# Patient Record
Sex: Male | Born: 1950
Health system: Southern US, Community
[De-identification: ages and names within clinical notes are randomized; demographics above are authoritative.]

## PROBLEM LIST (undated history)

## (undated) DIAGNOSIS — I10 Essential (primary) hypertension: Secondary | ICD-10-CM

## (undated) DIAGNOSIS — M797 Fibromyalgia: Secondary | ICD-10-CM

## (undated) DIAGNOSIS — M353 Polymyalgia rheumatica: Secondary | ICD-10-CM

## (undated) DIAGNOSIS — J301 Allergic rhinitis due to pollen: Secondary | ICD-10-CM

## (undated) DIAGNOSIS — N200 Calculus of kidney: Secondary | ICD-10-CM

## (undated) DIAGNOSIS — C4491 Basal cell carcinoma of skin, unspecified: Secondary | ICD-10-CM

## (undated) DIAGNOSIS — I4891 Unspecified atrial fibrillation: Secondary | ICD-10-CM

## (undated) DIAGNOSIS — Z87442 Personal history of urinary calculi: Secondary | ICD-10-CM

## (undated) DIAGNOSIS — N400 Enlarged prostate without lower urinary tract symptoms: Secondary | ICD-10-CM

## (undated) DIAGNOSIS — C801 Malignant (primary) neoplasm, unspecified: Secondary | ICD-10-CM

## (undated) DIAGNOSIS — E785 Hyperlipidemia, unspecified: Secondary | ICD-10-CM

## (undated) DIAGNOSIS — M1990 Unspecified osteoarthritis, unspecified site: Secondary | ICD-10-CM

## (undated) DIAGNOSIS — D099 Carcinoma in situ, unspecified: Secondary | ICD-10-CM

## (undated) HISTORY — DX: Essential (primary) hypertension: I10

## (undated) HISTORY — DX: Allergic rhinitis due to pollen: J30.1

## (undated) HISTORY — DX: Benign prostatic hyperplasia without lower urinary tract symptoms: N40.0

## (undated) HISTORY — DX: Unspecified osteoarthritis, unspecified site: M19.90

## (undated) HISTORY — DX: Basal cell carcinoma of skin, unspecified: C44.91

## (undated) HISTORY — DX: Carcinoma in situ, unspecified: D09.9

## (undated) HISTORY — DX: Polymyalgia rheumatica: M35.3

## (undated) HISTORY — PX: COLONOSCOPY: SHX174

## (undated) HISTORY — PX: CATARACT EXTRACTION: SUR2

## (undated) HISTORY — DX: Unspecified atrial fibrillation: I48.91

## (undated) HISTORY — DX: Calculus of kidney: N20.0

## (undated) HISTORY — PX: RETINAL TEAR REPAIR CRYOTHERAPY: SHX5304

## (undated) HISTORY — DX: Hyperlipidemia, unspecified: E78.5

## (undated) HISTORY — DX: Malignant (primary) neoplasm, unspecified: C80.1

---

## 1997-04-05 HISTORY — PX: THUMB ARTHROSCOPY: SHX2509

## 2006-01-11 ENCOUNTER — Ambulatory Visit: Payer: Self-pay | Admitting: Internal Medicine

## 2006-01-27 ENCOUNTER — Ambulatory Visit: Payer: Self-pay | Admitting: Internal Medicine

## 2006-01-28 ENCOUNTER — Ambulatory Visit: Payer: Self-pay | Admitting: Internal Medicine

## 2006-02-23 ENCOUNTER — Ambulatory Visit: Payer: Self-pay | Admitting: Internal Medicine

## 2006-03-23 ENCOUNTER — Ambulatory Visit: Payer: Self-pay | Admitting: Internal Medicine

## 2006-03-30 ENCOUNTER — Ambulatory Visit: Payer: Self-pay | Admitting: Internal Medicine

## 2006-04-27 ENCOUNTER — Ambulatory Visit: Payer: Self-pay | Admitting: Internal Medicine

## 2006-05-25 ENCOUNTER — Ambulatory Visit: Payer: Self-pay | Admitting: Internal Medicine

## 2006-06-22 ENCOUNTER — Ambulatory Visit: Payer: Self-pay | Admitting: Internal Medicine

## 2006-07-11 ENCOUNTER — Ambulatory Visit: Payer: Self-pay | Admitting: Internal Medicine

## 2006-07-28 ENCOUNTER — Encounter: Payer: Self-pay | Admitting: Internal Medicine

## 2006-08-01 ENCOUNTER — Ambulatory Visit: Payer: Self-pay | Admitting: Internal Medicine

## 2006-08-01 DIAGNOSIS — M7660 Achilles tendinitis, unspecified leg: Secondary | ICD-10-CM

## 2006-08-03 ENCOUNTER — Ambulatory Visit: Payer: Self-pay | Admitting: Internal Medicine

## 2006-08-03 LAB — CONVERTED CEMR LAB: INR: 2.8

## 2006-09-13 ENCOUNTER — Ambulatory Visit: Payer: Self-pay | Admitting: Internal Medicine

## 2006-09-13 DIAGNOSIS — M1990 Unspecified osteoarthritis, unspecified site: Secondary | ICD-10-CM | POA: Insufficient documentation

## 2006-09-14 ENCOUNTER — Ambulatory Visit: Payer: Self-pay | Admitting: Internal Medicine

## 2006-09-15 LAB — CONVERTED CEMR LAB: PSA: 0.98 ng/mL (ref 0.10–4.00)

## 2006-10-19 ENCOUNTER — Ambulatory Visit: Payer: Self-pay | Admitting: Internal Medicine

## 2006-11-02 ENCOUNTER — Ambulatory Visit: Payer: Self-pay | Admitting: Internal Medicine

## 2006-11-10 ENCOUNTER — Ambulatory Visit: Payer: Self-pay | Admitting: Internal Medicine

## 2006-11-10 LAB — CONVERTED CEMR LAB: Prothrombin Time: 21.8 s

## 2006-12-08 ENCOUNTER — Ambulatory Visit: Payer: Self-pay | Admitting: Internal Medicine

## 2007-01-05 ENCOUNTER — Ambulatory Visit: Payer: Self-pay | Admitting: Internal Medicine

## 2007-01-05 LAB — CONVERTED CEMR LAB: Prothrombin Time: 21.6 s

## 2007-02-09 ENCOUNTER — Ambulatory Visit: Payer: Self-pay | Admitting: Internal Medicine

## 2007-02-09 LAB — CONVERTED CEMR LAB: Prothrombin Time: 23.6 s

## 2007-03-16 ENCOUNTER — Ambulatory Visit: Payer: Self-pay | Admitting: Internal Medicine

## 2007-03-16 ENCOUNTER — Telehealth (INDEPENDENT_AMBULATORY_CARE_PROVIDER_SITE_OTHER): Payer: Self-pay | Admitting: *Deleted

## 2007-03-16 LAB — CONVERTED CEMR LAB
INR: 3
Prothrombin Time: 21 s

## 2007-04-13 ENCOUNTER — Ambulatory Visit: Payer: Self-pay | Admitting: Internal Medicine

## 2007-04-13 LAB — CONVERTED CEMR LAB
INR: 2.6
Prothrombin Time: 19.7 s

## 2007-05-25 ENCOUNTER — Ambulatory Visit: Payer: Self-pay | Admitting: Internal Medicine

## 2007-05-25 LAB — CONVERTED CEMR LAB: Prothrombin Time: 21 s

## 2007-07-06 ENCOUNTER — Ambulatory Visit: Payer: Self-pay | Admitting: Internal Medicine

## 2007-07-07 LAB — CONVERTED CEMR LAB: Prothrombin Time: 26.3 s — ABNORMAL HIGH (ref 10.9–13.3)

## 2007-07-18 ENCOUNTER — Ambulatory Visit: Payer: Self-pay | Admitting: Internal Medicine

## 2007-07-18 LAB — CONVERTED CEMR LAB: Prothrombin Time: 17.3 s

## 2007-08-04 HISTORY — PX: CAPSULOTOMY: SHX379

## 2007-08-07 ENCOUNTER — Ambulatory Visit: Payer: Self-pay | Admitting: Internal Medicine

## 2007-08-07 LAB — CONVERTED CEMR LAB: INR: 3.7

## 2007-08-21 ENCOUNTER — Ambulatory Visit: Payer: Self-pay | Admitting: Internal Medicine

## 2007-08-21 LAB — CONVERTED CEMR LAB: INR: 3

## 2007-09-22 ENCOUNTER — Ambulatory Visit: Payer: Self-pay | Admitting: Internal Medicine

## 2007-09-22 LAB — CONVERTED CEMR LAB: INR: 4

## 2007-09-25 ENCOUNTER — Encounter: Payer: Self-pay | Admitting: Internal Medicine

## 2007-09-25 LAB — CONVERTED CEMR LAB
Sex Hormone Binding: 34 nmol/L (ref 13–71)
Testosterone: 476.01 ng/dL (ref 350–890)

## 2007-09-26 LAB — CONVERTED CEMR LAB
AST: 21 units/L (ref 0–37)
Albumin: 4.2 g/dL (ref 3.5–5.2)
BUN: 19 mg/dL (ref 6–23)
CO2: 30 meq/L (ref 19–32)
Calcium: 9.3 mg/dL (ref 8.4–10.5)
Chloride: 105 meq/L (ref 96–112)
Creatinine, Ser: 1 mg/dL (ref 0.4–1.5)
Eosinophils Absolute: 0.1 10*3/uL (ref 0.0–0.7)
Glucose, Bld: 96 mg/dL (ref 70–99)
Hemoglobin: 14.7 g/dL (ref 13.0–17.0)
Lymphocytes Relative: 31.4 % (ref 12.0–46.0)
MCHC: 33.4 g/dL (ref 30.0–36.0)
Neutrophils Relative %: 57.6 % (ref 43.0–77.0)
PSA: 0.99 ng/mL (ref 0.10–4.00)
Phosphorus: 3.5 mg/dL (ref 2.3–4.6)
Prolactin: 3.4 ng/mL
Sodium: 140 meq/L (ref 135–145)
Triglycerides: 96 mg/dL (ref 0–149)
VLDL: 19 mg/dL (ref 0–40)

## 2007-10-09 ENCOUNTER — Ambulatory Visit: Payer: Self-pay | Admitting: Internal Medicine

## 2007-10-10 LAB — CONVERTED CEMR LAB: Prothrombin Time: 28.4 s — ABNORMAL HIGH (ref 10.9–13.3)

## 2007-10-13 ENCOUNTER — Encounter: Payer: Self-pay | Admitting: Internal Medicine

## 2007-11-07 ENCOUNTER — Ambulatory Visit: Payer: Self-pay | Admitting: Internal Medicine

## 2007-12-05 ENCOUNTER — Ambulatory Visit: Payer: Self-pay | Admitting: Internal Medicine

## 2007-12-05 LAB — CONVERTED CEMR LAB
INR: 2.8
Prothrombin Time: 20.2 s

## 2007-12-22 ENCOUNTER — Ambulatory Visit: Payer: Self-pay | Admitting: Internal Medicine

## 2008-01-03 ENCOUNTER — Telehealth: Payer: Self-pay | Admitting: Internal Medicine

## 2008-01-04 ENCOUNTER — Ambulatory Visit: Payer: Self-pay | Admitting: Internal Medicine

## 2008-01-04 LAB — CONVERTED CEMR LAB: INR: 3.1

## 2008-02-12 ENCOUNTER — Ambulatory Visit: Payer: Self-pay | Admitting: Internal Medicine

## 2008-02-12 LAB — CONVERTED CEMR LAB: INR: 3.4

## 2008-03-04 ENCOUNTER — Ambulatory Visit: Payer: Self-pay | Admitting: Internal Medicine

## 2008-03-04 LAB — CONVERTED CEMR LAB: Prothrombin Time: 20 s

## 2008-04-08 ENCOUNTER — Ambulatory Visit: Payer: Self-pay | Admitting: Internal Medicine

## 2008-05-06 ENCOUNTER — Ambulatory Visit: Payer: Self-pay | Admitting: Internal Medicine

## 2008-05-07 ENCOUNTER — Ambulatory Visit: Payer: Self-pay | Admitting: Internal Medicine

## 2008-05-20 ENCOUNTER — Ambulatory Visit: Payer: Self-pay | Admitting: Internal Medicine

## 2008-05-20 LAB — CONVERTED CEMR LAB: INR: 2.6

## 2008-06-17 ENCOUNTER — Ambulatory Visit: Payer: Self-pay | Admitting: Internal Medicine

## 2008-06-17 LAB — CONVERTED CEMR LAB: INR: 3.2

## 2008-07-04 HISTORY — PX: SQUAMOUS CELL CARCINOMA EXCISION: SHX2433

## 2008-07-12 ENCOUNTER — Ambulatory Visit: Payer: Self-pay | Admitting: Internal Medicine

## 2008-07-12 LAB — CONVERTED CEMR LAB: Prothrombin Time: 26.7 s

## 2008-07-19 ENCOUNTER — Ambulatory Visit: Payer: Self-pay | Admitting: Internal Medicine

## 2008-07-19 LAB — CONVERTED CEMR LAB
INR: 1.6
Prothrombin Time: 15.4 s

## 2008-08-02 ENCOUNTER — Ambulatory Visit: Payer: Self-pay | Admitting: Internal Medicine

## 2008-08-02 LAB — CONVERTED CEMR LAB: Prothrombin Time: 24.9 s

## 2008-08-05 ENCOUNTER — Ambulatory Visit: Payer: Self-pay | Admitting: Internal Medicine

## 2008-08-16 ENCOUNTER — Ambulatory Visit: Payer: Self-pay | Admitting: Internal Medicine

## 2008-08-16 LAB — CONVERTED CEMR LAB: INR: 2.8

## 2008-09-06 ENCOUNTER — Ambulatory Visit: Payer: Self-pay | Admitting: Internal Medicine

## 2008-09-06 LAB — CONVERTED CEMR LAB
INR: 2.3
Prothrombin Time: 18.5 s

## 2008-10-04 ENCOUNTER — Ambulatory Visit: Payer: Self-pay | Admitting: Internal Medicine

## 2008-10-04 LAB — CONVERTED CEMR LAB
INR: 2.8
Prothrombin Time: 20.4 s

## 2008-11-12 ENCOUNTER — Ambulatory Visit: Payer: Self-pay | Admitting: Internal Medicine

## 2008-11-12 LAB — CONVERTED CEMR LAB: INR: 2.5

## 2008-12-18 ENCOUNTER — Ambulatory Visit: Payer: Self-pay | Admitting: Internal Medicine

## 2008-12-18 LAB — CONVERTED CEMR LAB: Prothrombin Time: 21.6 s

## 2008-12-19 ENCOUNTER — Ambulatory Visit: Payer: Self-pay | Admitting: Family Medicine

## 2009-01-09 ENCOUNTER — Ambulatory Visit: Payer: Self-pay | Admitting: Internal Medicine

## 2009-01-21 ENCOUNTER — Ambulatory Visit: Payer: Self-pay | Admitting: Internal Medicine

## 2009-01-21 LAB — CONVERTED CEMR LAB: INR: 2.3

## 2009-02-18 ENCOUNTER — Ambulatory Visit: Payer: Self-pay | Admitting: Internal Medicine

## 2009-02-18 LAB — CONVERTED CEMR LAB: INR: 2.7

## 2009-04-01 ENCOUNTER — Ambulatory Visit: Payer: Self-pay | Admitting: Internal Medicine

## 2009-04-01 LAB — CONVERTED CEMR LAB: Prothrombin Time: 20.2 s

## 2009-04-28 ENCOUNTER — Telehealth: Payer: Self-pay | Admitting: Internal Medicine

## 2009-04-29 ENCOUNTER — Ambulatory Visit: Payer: Self-pay | Admitting: Family Medicine

## 2009-04-30 ENCOUNTER — Telehealth: Payer: Self-pay | Admitting: Family Medicine

## 2009-05-01 ENCOUNTER — Encounter: Admission: RE | Admit: 2009-05-01 | Discharge: 2009-05-01 | Payer: Self-pay | Admitting: Family Medicine

## 2009-05-07 ENCOUNTER — Ambulatory Visit: Payer: Self-pay | Admitting: Internal Medicine

## 2009-05-07 LAB — CONVERTED CEMR LAB
Alkaline Phosphatase: 49 units/L (ref 39–117)
Basophils Absolute: 0 10*3/uL (ref 0.0–0.1)
Bilirubin, Direct: 0.1 mg/dL (ref 0.0–0.3)
Eosinophils Absolute: 0.1 10*3/uL (ref 0.0–0.7)
Eosinophils Relative: 2.5 % (ref 0.0–5.0)
Lymphocytes Relative: 30.3 % (ref 12.0–46.0)
Lymphs Abs: 1.5 10*3/uL (ref 0.7–4.0)
MCV: 95.6 fL (ref 78.0–100.0)
Monocytes Absolute: 0.4 10*3/uL (ref 0.1–1.0)
Monocytes Relative: 8.4 % (ref 3.0–12.0)
Phosphorus: 3.4 mg/dL (ref 2.3–4.6)
Platelets: 203 10*3/uL (ref 150.0–400.0)
RDW: 12.5 % (ref 11.5–14.6)
Total CHOL/HDL Ratio: 5
Total Protein: 6.4 g/dL (ref 6.0–8.3)

## 2009-05-12 ENCOUNTER — Ambulatory Visit: Payer: Self-pay | Admitting: Internal Medicine

## 2009-05-12 DIAGNOSIS — E785 Hyperlipidemia, unspecified: Secondary | ICD-10-CM

## 2009-05-12 DIAGNOSIS — I4891 Unspecified atrial fibrillation: Secondary | ICD-10-CM | POA: Insufficient documentation

## 2009-05-12 LAB — CONVERTED CEMR LAB: Prothrombin Time: 18.1 s

## 2009-06-24 ENCOUNTER — Ambulatory Visit: Payer: Self-pay | Admitting: Internal Medicine

## 2009-06-24 LAB — CONVERTED CEMR LAB: INR: 2.6

## 2009-06-30 ENCOUNTER — Telehealth: Payer: Self-pay | Admitting: Internal Medicine

## 2009-08-05 ENCOUNTER — Ambulatory Visit: Payer: Self-pay | Admitting: Internal Medicine

## 2009-08-05 LAB — CONVERTED CEMR LAB
INR: 2.4
Prothrombin Time: 29.1 s

## 2009-08-11 ENCOUNTER — Ambulatory Visit: Payer: Self-pay | Admitting: Internal Medicine

## 2009-08-11 DIAGNOSIS — R0989 Other specified symptoms and signs involving the circulatory and respiratory systems: Secondary | ICD-10-CM

## 2009-08-11 DIAGNOSIS — R0609 Other forms of dyspnea: Secondary | ICD-10-CM

## 2009-08-12 ENCOUNTER — Encounter: Payer: Self-pay | Admitting: Internal Medicine

## 2009-08-19 ENCOUNTER — Telehealth: Payer: Self-pay | Admitting: Internal Medicine

## 2009-09-03 HISTORY — PX: MENISCUS REPAIR: SHX5179

## 2009-09-05 ENCOUNTER — Encounter: Payer: Self-pay | Admitting: Internal Medicine

## 2009-09-05 ENCOUNTER — Ambulatory Visit (HOSPITAL_COMMUNITY): Admission: RE | Admit: 2009-09-05 | Discharge: 2009-09-05 | Payer: Self-pay | Admitting: Internal Medicine

## 2009-09-05 ENCOUNTER — Ambulatory Visit: Payer: Self-pay

## 2009-09-05 ENCOUNTER — Ambulatory Visit: Payer: Self-pay | Admitting: Cardiovascular Disease

## 2009-09-08 ENCOUNTER — Telehealth: Payer: Self-pay | Admitting: Internal Medicine

## 2009-09-16 ENCOUNTER — Ambulatory Visit: Payer: Self-pay | Admitting: Internal Medicine

## 2009-09-16 LAB — CONVERTED CEMR LAB: Prothrombin Time: 20.5 s

## 2009-09-30 ENCOUNTER — Ambulatory Visit: Payer: Self-pay | Admitting: Internal Medicine

## 2009-09-30 LAB — CONVERTED CEMR LAB
INR: 2.2
Prothrombin Time: 26.4 s

## 2009-11-11 ENCOUNTER — Ambulatory Visit: Payer: Self-pay | Admitting: Internal Medicine

## 2009-12-23 ENCOUNTER — Ambulatory Visit: Payer: Self-pay | Admitting: Internal Medicine

## 2009-12-23 LAB — CONVERTED CEMR LAB
INR: 2.2
Prothrombin Time: 26.5 s

## 2009-12-24 ENCOUNTER — Ambulatory Visit: Payer: Self-pay | Admitting: Internal Medicine

## 2010-02-03 ENCOUNTER — Ambulatory Visit: Payer: Self-pay | Admitting: Internal Medicine

## 2010-02-03 LAB — CONVERTED CEMR LAB: INR: 2.1

## 2010-03-17 ENCOUNTER — Ambulatory Visit: Payer: Self-pay | Admitting: Internal Medicine

## 2010-04-02 ENCOUNTER — Ambulatory Visit
Admission: RE | Admit: 2010-04-02 | Discharge: 2010-04-02 | Payer: Self-pay | Source: Home / Self Care | Attending: Internal Medicine | Admitting: Internal Medicine

## 2010-04-02 LAB — CONVERTED CEMR LAB
INR: 2.3
Prothrombin Time: 28.7 s

## 2010-05-05 NOTE — Progress Notes (Signed)
Summary: sorethroat, cough, wheezing at night  Phone Note Call from Patient Call back at [phone]   Caller: Patient Call For: Cindee Salt MD Summary of Call: Pt has sorethroat, drainage at back of throat, non productive cough, wheezing on the exhale during the night last night. No trouble getting his breath. No known fever, No pain in chest when coughs or takes deep breath. Symptoms began last night. Pt uses [phone] if needed. Please advise.  Initial call taken by: Lewanda Rife LPN,  April 28, 2009 2:01 PM  Follow-up for Phone Call        sounds like a cold I recommend tylenol and he can try honey for cough. vick's vapor rub has shown some effectiveness with congestion If worsens, or doesn't improve over time, can set up appt for eval Follow-up by: Cindee Salt MD,  April 28, 2009 2:25 PM  Additional Follow-up for Phone Call Additional follow up Details #1::        Spoke with patient's wife and advised results.  Additional Follow-up by: Mervin Hack CMA Duncan Dull),  April 28, 2009 3:19 PM

## 2010-05-05 NOTE — Assessment & Plan Note (Signed)
Summary: 1 YR F/U   Visit Type:  1 yr. Primary Provider:  Dr Alphonsus Sias  CC:  no complaints.  History of Present Illness: Rodney Gray returns today for followup.  He is a middle aged man with chronic atrial fibrillation who has been maintained on coumadin for the past several years.  He has no symptoms of his atrial fibrillation.  He denies c/p.  He has had 3 episodes of dyspnea lasting up to an hour which have occured at night. No peripheral edema.  Problems Prior to Update: 1)  Atrial Fibrillation  (ICD-427.31) 2)  Hyperlipidemia  (ICD-272.4) 3)  Hypertension  (ICD-401.9) 4)  Need Prophylactic Vaccination&inoculation Flu  (ICD-V04.81) 5)  Erectile Dysfunction, Organic  (ICD-607.84) 6)  Screening For Malignant Neoplasm, Prostate  (ICD-V76.44) 7)  Health Maintenance Exam  (ICD-V70.0) 8)  Osteoarthritis  (ICD-715.90) 9)  Anticoagulation Therapy  (ICD-V58.61) 10)  Encounter For Therapeutic Drug Monitoring  (ICD-V58.83) 11)  Achilles Tendinitis  (ICD-726.71)  Current Medications (verified): 1)  Coumadin 5 Mg Tabs (Warfarin Sodium) .... Take As Directed 2)  Metoprolol Succinate 25 Mg Tb24 (Metoprolol Succinate) .... 1/2tablet Twice A Day By Mouth 3)  Fish Oil 500 Mg Caps (Omega-3 Fatty Acids) .... He Uses Otc 4)  Therapeutic Multivitamin  Tabs (Multiple Vitamin) .Marland Kitchen.. 1 Daily By Mouth 5)  Cinnamon 500 Mg Caps (Cinnamon) .... Patient Takes 1000mg  Two Times A Day  ** We Don't Have A 1000mg  Listed 6)  Glucosamine-Chondroitin 500-400 Mg Caps (Glucosamine-Chondroitin) .... Take 1 Capsule By Mouth Once A Day 7)  Cholest Off 450 Mg Tabs (Plant Sterols and Stanols) .... Take 2  Tablet By Mouth Once A Day  Allergies: 1)  ! Ampicillin (Ampicillin) 2)  Quinidine  Past History:  Past Medical History: Last updated: 05/12/2009 Atrial fibrillation Osteoarthritis HTN Hyperlipidemia  Past Surgical History: Last updated: 05/12/2009 A-Fib, 1987, 2003, 2005, EPS RX 2005 (A-Fib reocurred) Left  thumb surgery 1999 Retinal tear/bleed- Right 12/06 Cataracts/IOL bilat----3/08 5/09 Capsulotomy bilaterally Squamous cell carcinoma left forearm  4/10  Review of Systems  The patient denies chest pain, syncope, dyspnea on exertion, and peripheral edema.    Vital Signs:  Patient profile:   60 year old male Height:      68 inches Weight:      183 pounds BMI:     27.93 Pulse rate:   80 / minute BP sitting:   116 / 68  (left arm) Cuff size:   regular  Vitals Entered By: Oswald Hillock (Aug 11, 2009 11:34 AM)  Physical Exam  General:  Well developed, well nourished, in no acute distress. Head:  normocephalic and atraumatic Eyes:  PERRLA/EOM intact; conjunctiva and lids normal. Mouth:  Oral mucosa and oropharynx without lesions or exudates.  Teeth in good repair. Neck:  Neck supple, no JVD. No masses, thyromegaly or abnormal cervical nodes. Lungs:  normal respiratory effort, no intercostal retractions, and no accessory muscle use.    Heart:  IRIR without murmur.  PMI is not enlarged nor laterally displaced. Abdomen:  Bowel sounds positive; abdomen soft and non-tender without masses, organomegaly, or hernias noted. No hepatosplenomegaly. Msk:  Back normal, normal gait. Muscle strength and tone normal. Pulses:  pulses normal in all 4 extremities Extremities:  No clubbing or cyanosis. Neurologic:  Alert and oriented x 3.   EKG  Procedure date:  08/11/2009  Findings:      Atrial fibrillation with a controlled ventricular response rate of: 80.  Impression & Recommendations:  Problem # 1:  ATRIAL FIBRILLATION (ICD-427.31) His ventricular rate appears to be well controlled.  Continue current meds. His updated medication list for this problem includes:    Coumadin 5 Mg Tabs (Warfarin sodium) .Marland Kitchen... Take as directed    Metoprolol Succinate 25 Mg Tb24 (Metoprolol succinate) .Marland Kitchen... 1/2tablet twice a day by mouth  Orders: Echocardiogram (Echo)  Problem # 2:  DYSPNEA ON  EXERTION (ICD-786.09) The etiology is unclear and this is a new problem.  It is unclear whether he has diastolic or systolic CHF or COPD.  I have asked him to check in BNP, and obtain a 2D echo. His updated medication list for this problem includes:    Metoprolol Succinate 25 Mg Tb24 (Metoprolol succinate) .Marland Kitchen... 1/2tablet twice a day by mouth  Orders: TLB-BNP (B-Natriuretic Peptide) (83880-BNPR)  Problem # 3:  HYPERTENSION (ICD-401.9) I have asked him to maintain a low sodium diet. His updated medication list for this problem includes:    Metoprolol Succinate 25 Mg Tb24 (Metoprolol succinate) .Marland Kitchen... 1/2tablet twice a day by mouth  Patient Instructions: 1)  Your physician wants you to follow-up in: 12 months with Dr Ladona Ridgel.  You will receive a reminder letter in the mail two months in advance. If you don't receive a letter, please call our office to schedule the follow-up appointment. 2)  Your physician has requested that you have an echocardiogram.  Echocardiography is a painless test that uses sound waves to create images of your heart. It provides your doctor with information about the size and shape of your heart and how well your heart's chambers and valves are working.  This procedure takes approximately one hour. There are no restrictions for this procedure.

## 2010-05-05 NOTE — Progress Notes (Signed)
Summary: pt needs to stop coumadin  Phone Note Call from Patient Call back at Home Phone [phone]   Caller: Patient Reason for Call: Talk to Nurse, Talk to Doctor Summary of Call: pt is having surgery on June 9th and needs to know when he needs to stop his waufrin Initial call taken by: Omer Jack,  Aug 19, 2009 2:45 PM  Follow-up for Phone Call        spoke with pt he is having authroscopic knee surgery with Dr Delanna [facility] on 09/11/09. Dennis Bast, RN, BSN  Aug 19, 2009 5:28 PM ok to stop coumadin 5 days prior to surgery.  May restart back on POD 1. Follow-up by: Laren Boom, MD, [facility],  Aug 26, 2009 1:01 PM  Additional Follow-up for Phone Call Additional follow up Details #1::        spoke with pt he is aware that Dr Ladona Ridgel is ok with him d/cing Coumadin 3-5 days prior to procedure. Dennis Bast, RN, BSN  Aug 26, 2009 1:54 PM

## 2010-05-05 NOTE — Assessment & Plan Note (Signed)
Summary: CPX  CYD   Vital Signs:  Patient profile:   60 year old male Weight:      192 pounds Temp:     98.5 degrees F oral Pulse rate:   64 / minute Pulse rhythm:   regular BP sitting:   128 / 80  (left arm) Cuff size:   large  Vitals Entered By: Mervin Hack CMA Duncan Dull) (May 12, 2009 3:26 PM) CC: adult physical   History of Present Illness: Here for PE  shoulder is much better Stretched ligament in left knee saw Dr Magnus Ivan at [facility] orth[facility] better after 2 weeks of knee brace---tries to avoid strain Now with some right hip pain--notes it in bed hasn't taken any meds--will consider tylenol but was concerned about the publicity    Allergies: 1)  ! Ampicillin (Ampicillin) 2)  Quinidine  Past History:  Past medical, surgical, family and social histories (including risk factors) reviewed for relevance to current acute and chronic problems.  Past Medical History: Atrial fibrillation Osteoarthritis HTN Hyperlipidemia  Past Surgical History: A-Fib, 1987, 2003, 2005, EPS RX 2005 (A-Fib reocurred) Left thumb surgery 1999 Retinal tear/bleed- Right 12/06 Cataracts/IOL bilat----3/08 5/09 Capsulotomy bilaterally Squamous cell carcinoma left forearm  4/10  Family History: Reviewed history from 07/29/2006 and no changes required. Dad died @75  of Cirrhosis (EtOH), HTN Mom alive at 20 3 brothers living No CAD,DM HTN just in Dad No prostate or colon cancer  Social History: Reviewed history from 07/29/2006 and no changes required. Retired-medical lab technologist Married Never Smoked Alcohol use-yes--occ  Review of Systems General:  Exercises occ--limited after recent resp infection Weight is stable has trouble sleeping due to hip wears seat belt. Eyes:  Denies double vision and vision loss-1 eye; did have "bubble" on eye last spring went away with meds. ENT:  Complains of decreased hearing; denies ringing in ears; teeth okay---regular with dentist. CV:   Denies chest pain or discomfort, difficulty breathing at night, difficulty breathing while lying down, fainting, lightheadness, palpitations, and shortness of breath with exertion. Resp:  Complains of shortness of breath and wheezing; denies cough; wheezing and SOB with recent infection ---better now. GI:  Complains of indigestion; denies abdominal pain, bloody stools, change in bowel habits, dark tarry stools, nausea, and vomiting; uses tums if eats spicy food. GU:  Denies erectile dysfunction, urinary frequency, and urinary hesitancy. MS:  Complains of joint pain; denies joint swelling; just right hip pain. Derm:  Complains of lesion(s); denies rash; new spot on right clavicle--seeing dermatologist next week. Neuro:  Denies headaches, numbness, tingling, and weakness. Psych:  Denies anxiety and depression. Heme:  Denies abnormal bruising and enlarge lymph nodes. Allergy:  Denies seasonal allergies and sneezing.   Impression & Recommendations:  Problem # 1:  HEALTH MAINTENANCE EXAM (ICD-V70.0) Assessment Comment Only up to date with colonscopy and just had normal PSA discussed fitness  Problem # 2:  ATRIAL FIBRILLATION (ICD-427.31) Assessment: Unchanged good rate control no changes needed  His updated medication list for this problem includes:    Coumadin 5 Mg Tabs (Warfarin sodium) .Marland Kitchen... Take as directed    Metoprolol Succinate 25 Mg Tb24 (Metoprolol succinate) .Marland Kitchen... 1/2tablet twice a day by mouth  Problem # 3:  HYPERLIPIDEMIA (ICD-272.4) Assessment: Unchanged mild elevation he is going to work on lifestyle   Labs Reviewed: SGOT: 24 (05/07/2009)   SGPT: 21 (05/07/2009)   HDL:42.20 (05/07/2009), 40.0 (09/22/2007)  LDL:DEL (09/22/2007)  Chol:205 (05/07/2009), 201 (09/22/2007)  Trig:159.0 (05/07/2009), 96 (09/22/2007)  Problem # 4:  HYPERTENSION (ICD-401.9) Assessment: Unchanged not a clear cut problem BP generally fine on the metoprolol  His updated medication list for this  problem includes:    Metoprolol Succinate 25 Mg Tb24 (Metoprolol succinate) .Marland Kitchen... 1/2tablet twice a day by mouth  BP today: 128/80 Prior BP: 112/76 (04/29/2009)  Labs Reviewed: K+: 4.6 (05/07/2009) Creat: : 0.8 (05/07/2009)   Chol: 205 (05/07/2009)   HDL: 42.20 (05/07/2009)   LDL: DEL (09/22/2007)   TG: 159.0 (05/07/2009)  Complete Medication List: 1)  Coumadin 5 Mg Tabs (Warfarin sodium) .... Take as directed 2)  Metoprolol Succinate 25 Mg Tb24 (Metoprolol succinate) .... 1/2tablet twice a day by mouth 3)  Fish Oil 500 Mg Caps (Omega-3 fatty acids) .... He uses otc 4)  Therapeutic Multivitamin Tabs (Multiple vitamin) .Marland Kitchen.. 1 daily by mouth 5)  Cinnamon 500 Mg Caps (Cinnamon) .... Patient takes 1000mg  two times a day  ** we don't have a 1000mg  listed 6)  Glucosamine-chondroitin 500-400 Mg Caps (Glucosamine-chondroitin) .... Take 1 capsule by mouth once a day 7)  Cholest Off 450 Mg Tabs (Plant sterols and stanols) .... Take 1 tablet by mouth once a day  Patient Instructions: 1)  Can try famotidine 20mg  or ranitidine 150mg  two times a day as needed (like before spicy foods) 2)  Please schedule an appointment with your primary doctor in :  .   Current Allergies (reviewed today): ! AMPICILLIN (AMPICILLIN) QUINIDINE

## 2010-05-05 NOTE — Assessment & Plan Note (Signed)
Summary: SORE THROAT/HEAR NOISE IN CHEST WHEEZING/RBH   Vital Signs:  Patient profile:   60 year old male Height:      68 inches Weight:      188.38 pounds BMI:     28.75 Temp:     97.8 degrees F oral Pulse rate:   80 / minute Pulse rhythm:   regular BP sitting:   112 / 76  (left arm) Cuff size:   large  Vitals Entered By: Delilah Shan CMA Duncan Dull) (April 29, 2009 11:39 AM) CC: ST, hears noise in chest - wheezing.   History of Present Illness: 60 yo with no h/o asthma presents with two days of mildly sore throat and wheezing in his chest. No runny nose, fevers, chills, n/v/d. Sore throat is nearly gone. No ear pain, no sinus pressure. No cough, no shortness of breath. Non smoker.  No weight, malaise or night sweats. Says he feels just fine, just has this audible wheeze.  Current Medications (verified): 1)  Coumadin 5 Mg Tabs (Warfarin Sodium) .... Take As Directed 2)  Metoprolol Succinate 25 Mg Tb24 (Metoprolol Succinate) .... 1/2tablet Twice A Day By Mouth 3)  Fish Oil 500 Mg Caps (Omega-3 Fatty Acids) .... He Uses Otc 4)  Therapeutic Multivitamin  Tabs (Multiple Vitamin) .Marland Kitchen.. 1 Daily By Mouth 5)  Cinnamon 500 Mg Caps (Cinnamon) .... Take 2  Tabs  Two Times A Day 6)  Glucosamine-Chondroitin 500-400 Mg Caps (Glucosamine-Chondroitin) .... Take 1 Capsule By Mouth Once A Day 7)  Cholest Off 450 Mg Tabs (Plant Sterols and Stanols) .... Take 1 Tablet By Mouth Once A Day 8)  Ventolin Hfa 108 (90 Base) Mcg/act Aers (Albuterol Sulfate) .... 2 Puffs Every 4-6 Hours As Needed For Wheezing.  Allergies: 1)  ! Ampicillin (Ampicillin) 2)  Quinidine  Review of Systems      See HPI General:  Denies chills and fever. ENT:  Complains of sore throat; denies earache, postnasal drainage, and sinus pressure. CV:  Denies chest pain or discomfort and difficulty breathing at night. Resp:  Complains of wheezing; denies cough, pleuritic, shortness of breath, and sputum productive.  Physical  Exam  General:  Well developed, well nourished, in no acute distress. Ears:  External ear exam shows no significant lesions or deformities.  Otoscopic examination reveals clear canals, tympanic membranes are intact bilaterally without bulging, retraction, inflammation or discharge. Hearing is grossly normal bilaterally. Nose:  External nasal examination shows no deformity or inflammation. Nasal mucosa are pink and moist without lesions or exudates. Mouth:  Oral mucosa and oropharynx without lesions or exudates.  Teeth in good repair. Lungs:  normal respiratory effort, no intercostal retractions, and no accessory muscle use.   Right lower lung- exp wheezes, no crackles. Left lung- normal breath sounds. Heart:  IRIR without murmur.  PMI is not enlarged nor laterally displaced. Psych:  pleasant, normal affect.   Impression & Recommendations:  Problem # 1:  WHEEZING (ICD-786.07) Assessment New Time spent with patient 25 minutes, more than 50% of this time talking with patient about possible causes of wheezing, he is very concerned. Likely reactive airway from mild URI.  Do not feel abx are warranted at this time given that he has no cough, fever, or other symptoms.  I will give albuterol inhaler to use as needed wheeze and get a chest xray given this new onset, isolated consolidated wheeze.   Orders: Radiology Referral (Radiology)  Complete Medication List: 1)  Coumadin 5 Mg Tabs (Warfarin sodium) .... Take  as directed 2)  Metoprolol Succinate 25 Mg Tb24 (Metoprolol succinate) .... 1/2tablet twice a day by mouth 3)  Fish Oil 500 Mg Caps (Omega-3 fatty acids) .... He uses otc 4)  Therapeutic Multivitamin Tabs (Multiple vitamin) .Marland Kitchen.. 1 daily by mouth 5)  Cinnamon 500 Mg Caps (Cinnamon) .... Take 2  tabs  two times a day 6)  Glucosamine-chondroitin 500-400 Mg Caps (Glucosamine-chondroitin) .... Take 1 capsule by mouth once a day 7)  Cholest Off 450 Mg Tabs (Plant sterols and stanols) .... Take  1 tablet by mouth once a day 8)  Ventolin Hfa 108 (90 Base) Mcg/act Aers (Albuterol sulfate) .... 2 puffs every 4-6 hours as needed for wheezing.  Patient Instructions: 1)  Good to see you again. 2)  Lets go ahead and get a chest xray, stop by to see Shirlee Limerick on the way out to set it up. 3)  Use albuterol inhaler as needed. 4)  At the first sign of fevers, productive cough, chills, or shortness of breath, call us! Prescriptions: VENTOLIN HFA 108 (90 BASE) MCG/ACT AERS (ALBUTEROL SULFATE) 2 puffs every 4-6 hours as needed for wheezing.  #1 x 0   Entered and Authorized by:   Ruthe Mannan MD   Signed by:   Ruthe Mannan MD on 04/29/2009   Method used:   Elect[facility]ically to        [facility] (retail)       [address]       [place], [place]  [address]       Ph: [phone] or [phone]       Fax: [phone]   RxID:   [id number]   Current Allergies (reviewed today): ! AMPICILLIN (AMPICILLIN) QUINIDINE

## 2010-05-05 NOTE — Assessment & Plan Note (Signed)
Summary: FLU SHOT/LETVAK/JRR  Nurse Visit   Allergies: 1)  ! Ampicillin (Ampicillin) 2)  Quinidine  Immunizations Administered:  Influenza Vaccine # 1:    Vaccine Type: Fluvax 3+    Site: left deltoid    Mfr: GlaxoSmithKline    Dose: 0.5 ml    Route: IM    Given by: Mervin Hack CMA (AAMA)    Exp. Date: 10/03/2010    Lot #: [id number]    VIS given: 10/28/09 version given December 24, 2009.  Flu Vaccine Consent Questions:    Do you have a history of severe allergic reactions to this vaccine? no    Any prior history of allergic reactions to egg and/or gelatin? no    Do you have a sensitivity to the preservative Thimersol? no    Do you have a past history of Guillan-Barre Syndrome? no    Do you currently have an acute febrile illness? no    Have you ever had a severe reaction to latex? no    Vaccine information given and explained to patient? yes  Orders Added: 1)  Flu Vaccine 68yrs + [90658] 2)  Admin 1st Vaccine [09811]

## 2010-05-05 NOTE — Progress Notes (Signed)
Summary: pt needs letter to stop coumadin  Phone Note From Other Clinic Call back at [phone] ext [phone]   Caller: Nurse/[facility] Request: Talk with Nurse, Talk with Provider Summary of Call: pt got approval to stop coumadin 3days prior to surgery and they need a letter faxed over stating this [phone] Initial call taken by: Omer Jack,  September 08, 2009 1:42 PM  Follow-up for Phone Call        info faxed over Dennis Bast, RN, BSN  September 08, 2009 2:16 PM

## 2010-05-05 NOTE — Progress Notes (Signed)
Summary: Update since office visit  Phone Note Call from Patient Call back at Home Phone [phone] Call back at [phone]   Caller: Patient Call For: Dr. Dayton Martes Summary of Call: Patient called and wanted to let Dr. Dayton Martes know that since she put him on Albuterol his cough is not in his throat, but in his upper chest.  He is coughing up bright and dark yellow mucous.   Initial call taken by: Linde Gillis CMA Duncan Dull),  April 30, 2009 10:03 AM  Follow-up for Phone Call        [facility], did he have his chest x ray yet? Follow-up by: Ruthe Mannan MD,  April 30, 2009 10:04 AM  Additional Follow-up for Phone Call Additional follow up Details #1::        Patient has appt for chest x-ray today Additional Follow-up by: Linde Gillis CMA Duncan Dull),  April 30, 2009 10:34 AM    Additional Follow-up for Phone Call Additional follow up Details #2::    Ok will call him with those results and take it from there. Follow-up by: Ruthe Mannan MD,  April 30, 2009 10:37 AM

## 2010-05-05 NOTE — Progress Notes (Signed)
Summary: pertussis  Phone Note Call from Patient Call back at Home Phone [phone]   Caller: Patient Call For: Cindee Salt MD Reason for Call: Talk to Doctor Summary of Call: Patient says that he is going to [place] on Wed. to visit with his daughter and her family. He say that his grandson was just in the hopital with pertussis. Patient wants to know if you think it is safe for him and his wife to go and visit with them. He also is asking if he could get a z-pak called in to help prevent getting the pertussis. Please advise. [facility]ses [facility].  Initial call taken by: Melody Comas,  June 30, 2009 8:43 AM  Follow-up for Phone Call        Not appropriate to treat for "prevention"  unless exposed before person is treated  If grandson has been treated, he should not really be that infectious  Also, he has had Tdap so should have protection Follow-up by: Cindee Salt MD,  June 30, 2009 10:14 AM  Additional Follow-up for Phone Call Additional follow up Details #1::        pt states that his daughter told him per the infectious disease center, stated that the TDAP will not protect them against the "para-pertussis"? his grandson was released from the hospital with liquid abx, he's on day 4 of this. Pt and wife was just concerned they might bring the "whooping cough" back. They will be going out to [place] on wednesday. Please advise. DeShannon Smith CMA Duncan Dull)  June 30, 2009 10:49 AM   I don't know what "parapertussis" means. See if they can get more specific information and what the official recommendations are Cindee Salt MD  June 30, 2009 1:58 PM   spoke with pt and their grandson test came back negative for pertussis, but positive for "parapertussis" this is not reported to the [facility] because of the limited amount of cases.  Pt states it's on the internet. I looked on the CDC and couldn't find it. I did find something from [place] on the internet about  this. Pt and wife state there was limited amount of information in [place] also. DeShannon Katrinka Blazing CMA Duncan Dull)  June 30, 2009 3:30 PM    It is Bordatella parapertussis It is milder than pertussis and generally not serious Mostly Rx is recommended for secondary prevention in those under 6 months. Doesn't seem that treatment is necessary for them since he has been treated already Cindee Salt MD  June 30, 2009 5:48 PM   Spoke with patient and wife and advised results, they understand. Additional Follow-up by: Mervin Hack CMA Duncan Dull),  June 30, 2009 5:54 PM

## 2010-05-11 ENCOUNTER — Ambulatory Visit (INDEPENDENT_AMBULATORY_CARE_PROVIDER_SITE_OTHER): Payer: 59

## 2010-05-11 ENCOUNTER — Encounter: Payer: Self-pay | Admitting: Internal Medicine

## 2010-05-11 DIAGNOSIS — I4891 Unspecified atrial fibrillation: Secondary | ICD-10-CM

## 2010-05-11 DIAGNOSIS — Z7901 Long term (current) use of anticoagulants: Secondary | ICD-10-CM

## 2010-05-11 DIAGNOSIS — Z5181 Encounter for therapeutic drug level monitoring: Secondary | ICD-10-CM

## 2010-05-11 LAB — CONVERTED CEMR LAB: INR: 2.2

## 2010-06-22 ENCOUNTER — Ambulatory Visit: Payer: 59

## 2010-06-25 ENCOUNTER — Telehealth: Payer: Self-pay | Admitting: Radiology

## 2010-06-25 ENCOUNTER — Ambulatory Visit (INDEPENDENT_AMBULATORY_CARE_PROVIDER_SITE_OTHER): Payer: 59 | Admitting: Internal Medicine

## 2010-06-25 DIAGNOSIS — Z5181 Encounter for therapeutic drug level monitoring: Secondary | ICD-10-CM

## 2010-06-25 DIAGNOSIS — Z7901 Long term (current) use of anticoagulants: Secondary | ICD-10-CM

## 2010-06-25 DIAGNOSIS — I4891 Unspecified atrial fibrillation: Secondary | ICD-10-CM

## 2010-06-25 LAB — POCT INR: INR: 2.4

## 2010-06-25 NOTE — Telephone Encounter (Signed)
Order for INR

## 2010-06-25 NOTE — Patient Instructions (Signed)
No changes

## 2010-07-28 ENCOUNTER — Encounter: Payer: Self-pay | Admitting: Internal Medicine

## 2010-08-05 ENCOUNTER — Encounter: Payer: Self-pay | Admitting: Internal Medicine

## 2010-08-06 ENCOUNTER — Ambulatory Visit (INDEPENDENT_AMBULATORY_CARE_PROVIDER_SITE_OTHER): Payer: 59 | Admitting: Internal Medicine

## 2010-08-06 ENCOUNTER — Encounter: Payer: Self-pay | Admitting: Internal Medicine

## 2010-08-06 ENCOUNTER — Ambulatory Visit: Payer: 59

## 2010-08-06 DIAGNOSIS — R0609 Other forms of dyspnea: Secondary | ICD-10-CM

## 2010-08-06 DIAGNOSIS — I4891 Unspecified atrial fibrillation: Secondary | ICD-10-CM

## 2010-08-06 DIAGNOSIS — R0989 Other specified symptoms and signs involving the circulatory and respiratory systems: Secondary | ICD-10-CM

## 2010-08-06 DIAGNOSIS — I10 Essential (primary) hypertension: Secondary | ICD-10-CM

## 2010-08-06 DIAGNOSIS — R0602 Shortness of breath: Secondary | ICD-10-CM

## 2010-08-06 NOTE — Assessment & Plan Note (Signed)
The patient's symptoms appear to be well controlled. His ventricular rate at rest is in the 60s. Today we discussed the pros and cons of continuing warfarin versus starting aspirin. The patient's Chads score is between zero and one. He was placed on Coumadin many years ago. I have notified him that it would be worth stopping Coumadin but he is not certain if he wants to. He states that he is worried about having a stroke. I reassured him that his risk is very very small. He will contact us if he decides to stop his Coumadin. If he does then I would recommend aspirin daily.

## 2010-08-06 NOTE — Assessment & Plan Note (Signed)
His blood pressure remains well controlled. His wife was with him today states that he is not certain that he has ever been diagnosed with hypertension. We'll see him in years past his blood pressure is usually been fairly well controlled though at times it has been slightly elevated.

## 2010-08-06 NOTE — Assessment & Plan Note (Signed)
His symptoms are very mild. I have asked him to reduce his dose of beta blocker to see if this helps.

## 2010-08-06 NOTE — Patient Instructions (Signed)
Your physician wants you to follow-up in: 12 months with Dr. Taylor. You will receive a reminder letter in the mail two months in advance. If you don't receive a letter, please call our office to schedule the follow-up appointment.    

## 2010-08-06 NOTE — Progress Notes (Signed)
HPI Rodney Gray returns today for followup. He is a 60 year old male with a history of chronic atrial fibrillation. We have followed him for many years. He has rare palpitations. He thinks that since we saw him last he has had more fatigue and wonders if it is not related to his beta blockers. His wife who is with him today wonders about whether or not he should stay on warfarin. No syncope, chest pain, shortness of breath, or peripheral edema. Allergies  Allergen Reactions  . Ampicillin     REACTION: hives  . Quinidine     REACTION: hearing loss, photosensitivity     Current Outpatient Prescriptions  Medication Sig Dispense Refill  . fish oil-omega-3 fatty acids 1000 MG capsule Take 1 g by mouth daily.        Marland Kitchen glucosamine-chondroitin 500-400 MG tablet Take 1 tablet by mouth 1 dose over 46 hours.       . metoprolol succinate (TOPROL-XL) 25 MG 24 hr tablet Take 25 mg by mouth daily. Take 1/2 tablet twice daily      . Multiple Vitamin (MULTIVITAMIN) capsule Take 1 capsule by mouth daily.        . NON FORMULARY 1 each 1 dose over 46 hours. Vitamin D gummie       . warfarin (COUMADIN) 5 MG tablet Take 5 mg by mouth daily.        Marland Kitchen DISCONTD: Cinnamon 500 MG capsule Take 500 mg by mouth daily.        Marland Kitchen DISCONTD: Plant Sterols and Stanols (CHOLEST OFF) 450 MG TABS Take by mouth daily.           Past Medical History  Diagnosis Date  . Arrhythmia   . Hypertension   . Osteoarthritis   . Hyperlipemia     ROS:   All systems reviewed and negative except as noted in the HPI.   Past Surgical History  Procedure Date  . Thumb surgery   . Retinal tear repair cryotherapy     right 12/06  . Cataracts     IOL bilat  3/08  . Squamous cell carcinoma left forearm     4/10     Family History  Problem Relation Age of Onset  . Hypertension Father      History   Social History  . Marital Status: Married    Spouse Name: N/A    Number of Children: N/A  . Years of Education: N/A    Occupational History  . retired Set designer     Social History Main Topics  . Smoking status: Never Smoker   . Smokeless tobacco: Not on file  . Alcohol Use: Yes     occ.  . Drug Use: Not on file  . Sexually Active: Not on file   Other Topics Concern  . Not on file   Social History Narrative  . No narrative on file     Ht 5\' 8"  (1.727 m)  Wt 183 lb (83.008 kg)  BMI 27.82 kg/m2  Physical Exam:  Well appearing NAD HEENT: Unremarkable Neck:  No JVD, no thyromegally Lymphatics:  No adenopathy Back:  No CVA tenderness Lungs:  Clear HEART: iregular rate rhythm, no murmurs, no rubs, no clicks Abd:  Flat, positive bowel sounds, no organomegally, no rebound, no guarding Ext:  2 plus pulses, no edema, no cyanosis, no clubbing Skin:  No rashes no nodules Neuro:  CN II through XII intact, motor grossly intact  EKG Atrial fibrillation with  a controlled ventricular response   Assess/Plan:

## 2010-08-18 ENCOUNTER — Encounter: Payer: Self-pay | Admitting: Internal Medicine

## 2010-08-18 NOTE — Assessment & Plan Note (Signed)
[facility]                         ELECTROPHYSIOLOGY OFFICE NOTE   NAME:COLLUMBELLBertrand, Vowels                   MRN:          [id number]  DATE:07/06/2007                            DOB:          11/20/1950    Mr. Woodin returns today for followup.  He is a very pleasant 60-  year-old male with a history of chronic atrial fibrillation,  hypertension and chronic Coumadin therapy.  He returns today for  followup.  He had no specific complaints today.  He denies chest pain.  He denies shortness of breath.   MEDICATIONS:  1. Warfarin 5 mg to 7 mg daily.  2. Metoprolol 25 mg twice daily.   PHYSICAL EXAM:  Notable for him being a pleasant well-appearing middle-  aged man in no distress.  Blood pressure 137/77.  The pulse was 58 and irregularly irregular.  The  respirations were 18.  His weight is 190 pounds, up 2 pounds from a year  ago.  NECK:  Revealed no jugular distention.  LUNGS:  Clear bilaterally to auscultation.  No wheezes, rales or rhonchi  were present.  CARDIOVASCULAR:  Exam revealed an irregularly irregular rhythm with  normal S1 and S2.  There were no murmurs, rubs or gallops.  EXTREMITIES:  Demonstrate no edema.   EKG:  Demonstrates atrial fibrillation with a controlled ventricular  response.   IMPRESSION:  1. Chronic atrial fibrillation.  2. Hypertension.  3. Chronic Coumadin therapy.   DISCUSSION:  Overall, Mr. Selley is stable.  He is inclined to  continue his Coumadin.  He will follow up with Dr. Alphonsus Sias for this and  he has a physical scheduled in several months from now, where his lab  work will be drawn.  We will see him back in 1 year or sooner should he  have any change in symptoms.     Doylene Canning. Ladona Ridgel, MD  Electronically Signed    GWT/MedQ  DD: 07/06/2007  DT: 07/06/2007  Job #: [id number]

## 2010-08-19 ENCOUNTER — Encounter: Payer: Self-pay | Admitting: Internal Medicine

## 2010-08-19 ENCOUNTER — Ambulatory Visit (INDEPENDENT_AMBULATORY_CARE_PROVIDER_SITE_OTHER): Payer: 59 | Admitting: Internal Medicine

## 2010-08-19 VITALS — BP 120/70 | HR 82 | Temp 98.5°F | Ht 68.0 in | Wt 182.0 lb

## 2010-08-19 DIAGNOSIS — I4891 Unspecified atrial fibrillation: Secondary | ICD-10-CM

## 2010-08-19 DIAGNOSIS — I10 Essential (primary) hypertension: Secondary | ICD-10-CM

## 2010-08-19 DIAGNOSIS — Z23 Encounter for immunization: Secondary | ICD-10-CM

## 2010-08-19 DIAGNOSIS — Z7901 Long term (current) use of anticoagulants: Secondary | ICD-10-CM

## 2010-08-19 DIAGNOSIS — E785 Hyperlipidemia, unspecified: Secondary | ICD-10-CM

## 2010-08-19 DIAGNOSIS — Z5181 Encounter for therapeutic drug level monitoring: Secondary | ICD-10-CM

## 2010-08-19 DIAGNOSIS — Z0000 Encounter for general adult medical examination without abnormal findings: Secondary | ICD-10-CM | POA: Insufficient documentation

## 2010-08-19 LAB — BASIC METABOLIC PANEL
CO2: 29 mEq/L (ref 19–32)
Calcium: 9.1 mg/dL (ref 8.4–10.5)
Chloride: 104 mEq/L (ref 96–112)
Glucose, Bld: 81 mg/dL (ref 70–99)
Sodium: 139 mEq/L (ref 135–145)

## 2010-08-19 LAB — CBC WITH DIFFERENTIAL/PLATELET
Eosinophils Relative: 1.4 % (ref 0.0–5.0)
HCT: 42.6 % (ref 39.0–52.0)
Hemoglobin: 14.7 g/dL (ref 13.0–17.0)
Lymphocytes Relative: 30.4 % (ref 12.0–46.0)
Lymphs Abs: 1.4 10*3/uL (ref 0.7–4.0)
Monocytes Relative: 8.1 % (ref 3.0–12.0)
Neutro Abs: 2.8 10*3/uL (ref 1.4–7.7)
Platelets: 209 10*3/uL (ref 150.0–400.0)
WBC: 4.7 10*3/uL (ref 4.5–10.5)

## 2010-08-19 LAB — LIPID PANEL
Cholesterol: 170 mg/dL (ref 0–200)
LDL Cholesterol: 102 mg/dL — ABNORMAL HIGH (ref 0–99)
Triglycerides: 123 mg/dL (ref 0.0–149.0)
VLDL: 24.6 mg/dL (ref 0.0–40.0)

## 2010-08-19 LAB — HEPATIC FUNCTION PANEL
ALT: 21 U/L (ref 0–53)
Bilirubin, Direct: 0.1 mg/dL (ref 0.0–0.3)
Total Protein: 6.3 g/dL (ref 6.0–8.3)

## 2010-08-19 NOTE — Patient Instructions (Signed)
Continue current dose, check in 6 weeks  

## 2010-08-19 NOTE — Progress Notes (Signed)
Subjective:    Patient ID: Rodney Gray, male    DOB: 12/03/1950, 60 y.o.   MRN: [id number]  HPI Doing okay Occ aches and pains Has been trying to walk 4 miles frequently Keeps 32 year old granddaughter No meds  Still on coumadin Considering stopping after consultation with Dr Ladona Ridgel Afraid to stop Not interested in pradaxa  Metoprolol dose decreased Not clearly better--just for 2 weeks though May have some more energy  Current outpatient prescriptions:Cholecalciferol (VITAMIN D3) 1000 UNITS CAPS, Take by mouth.  , Disp: , Rfl: ;  fish oil-omega-3 fatty acids 1000 MG capsule, Take 1 g by mouth daily.  , Disp: , Rfl: ;  glucosamine-chondroitin 500-400 MG tablet, Take 1 tablet by mouth 1 dose over 46 hours. , Disp: , Rfl: ;  metoprolol succinate (TOPROL-XL) 25 MG 24 hr tablet, Take 12.5 mg by mouth daily. , Disp: , Rfl:  Multiple Vitamins-Minerals (MENS MULTIVITAMIN PLUS PO), Take by mouth daily.  , Disp: , Rfl: ;  warfarin (COUMADIN) 5 MG tablet, Take 5 mg by mouth daily.  , Disp: , Rfl: ;  DISCONTD: Cinnamon 500 MG capsule, Take 1,000 mg by mouth 2 (two) times daily.  , Disp: , Rfl: ;  DISCONTD: Multiple Vitamin (MULTIVITAMIN) capsule, Take 1 capsule by mouth daily.  , Disp: , Rfl:  DISCONTD: NON FORMULARY, 1 each 1 dose over 46 hours. Vitamin D gummie , Disp: , Rfl: ;  DISCONTD: Plant Sterols and Stanols (CHOLEST OFF) 450 MG TABS, Take 2 tablets by mouth daily.  , Disp: , Rfl:   Past Medical History  Diagnosis Date  . Hypertension   . Osteoarthritis   . Hyperlipemia   . Atrial fibrillation     A-Fib, 1987, 2003, 2005, EPS RX 2005 (A-Fib reocurred)  . Cataract     Past Surgical History  Procedure Date  . Retinal tear repair cryotherapy     right 12/06  . Thumb arthroscopy 1999  . Retinal tear repair cryotherapy     Retinal tear/bleed- Right 12/06  . Capsulotomy 08/2007    bilaterally  . Squamous cell carcinoma excision 07/2008    left forearm    Family History    Problem Relation Age of Onset  . Hypertension Father   . Coronary artery disease Neg Hx   . Diabetes Neg Hx   . Cancer Neg Hx     prostate or colon    History   Social History  . Marital Status: Married    Spouse Name: N/A    Number of Children: N/A  . Years of Education: N/A   Occupational History  . retired Set designer     Social History Main Topics  . Smoking status: Never Smoker   . Smokeless tobacco: Not on file  . Alcohol Use: Yes     occ.  . Drug Use: Not on file  . Sexually Active: Not on file   Other Topics Concern  . Not on file   Social History Narrative  . No narrative on file   Review of Systems  Constitutional: Negative for fever and activity change.       Weight is down 10# since last year Wears seat belt  HENT: Positive for hearing loss, dental problem and tinnitus.        Occ brief pounding sensation in ears--not pulse Occ mild sinus symptoms cutting grass--hasn't used meds Keeps up with dentist---has had an infection (on cleocin)  Eyes: Negative for visual disturbance.  No diplopia of focal vision loss  Respiratory: Negative for cough, chest tightness and shortness of breath.   Cardiovascular: Negative for chest pain, palpitations and leg swelling.  Gastrointestinal: Negative for nausea, vomiting, abdominal pain, constipation and blood in stool.       Occ heartburn--tums helps  Genitourinary: Positive for frequency. Negative for dysuria and difficulty urinating.       No sig nocturia Mild ED but does okay   Musculoskeletal: Positive for arthralgias. Negative for back pain and joint swelling.  Skin: Negative for rash.       No suspicious lesions----sees derm yearly  Neurological: Negative for dizziness, syncope, weakness, light-headedness and numbness.  Hematological: Negative for adenopathy. Does not bruise/bleed easily.  Psychiatric/Behavioral: Positive for sleep disturbance. Negative for dysphoric mood. The patient is not  nervous/anxious.        Sleeps propped to prevent snoring Moves around a lot Occ daytime somnolence---naps help       Objective:   Physical Exam  Constitutional: He is oriented to person, place, and time. He appears well-developed and well-nourished. No distress.  HENT:  Head: Normocephalic and atraumatic.  Right Ear: External ear normal.  Left Ear: External ear normal.  Mouth/Throat: Oropharynx is clear and moist. No oropharyngeal exudate.       TMs normal  Eyes: Conjunctivae and EOM are normal. Pupils are equal, round, and reactive to light.       Fundi benign  Neck: Normal range of motion. Neck supple. No thyromegaly present.  Cardiovascular: Normal rate, normal heart sounds and intact distal pulses.  Exam reveals no gallop.        irregular  Pulmonary/Chest: Effort normal and breath sounds normal. No respiratory distress. He has no wheezes. He has no rales.  Abdominal: Soft. There is no tenderness.  Musculoskeletal: Normal range of motion. He exhibits no edema and no tenderness.  Lymphadenopathy:    He has no cervical adenopathy.  Neurological: He is alert and oriented to person, place, and time. He exhibits normal muscle tone.       Normal strength and gait  Skin: No rash noted.       No suspicious lesions  Psychiatric: He has a normal mood and affect. His behavior is normal. Judgment and thought content normal.          Assessment & Plan:

## 2010-08-21 NOTE — Letter (Signed)
January 28, 2006    Karie Schwalbe, MD  [address] [place], [place] [address]   RE:  Rodney, Gray  MRN:  [id number]  /  DOB:  [birth year]   Dear Luan Pulling:   Thank you for referring Rodney Gray for ongoing EP evaluation.  As  you know he is a pleasant middle-aged man who recently moved to [place] from [place], who has had a history of long-standing atrial  fibrillation, for which he has been relegated to rate control for the last  several years.  He is essentially asymptomatic regarding his atrial  fibrillation and his rate has been well-controlled.  We saw him today in  followup.  A copy of our clinic note will be forthcoming to you.  Basically,  he has a history of atrial fibrillation for many years and has been well-  controlled on Coumadin, and has minimal if any symptoms with regard to his a  fib.  His exam was unremarkable except for an irregularly irregular rhythm.  His EKG demonstrates a fib.   I have discussed treatment options with the patient and recommended we  continue a course of rate control with low-dose beta blockers.  I will see  him back in 6 months.  Ultimately, I would continue him in this line of  treatment unless his symptoms change.    Sincerely,     ______________________________  Doylene Canning. Ladona Ridgel, MD    GWT/MedQ  DD: 01/28/2006  DT: 01/30/2006  Job #: [id number]

## 2010-08-21 NOTE — Assessment & Plan Note (Signed)
[facility]                         ELECTROPHYSIOLOGY OFFICE NOTE   NAME:COLLUMBELLBeauford, Lando                   MRN:          [id number]  DATE:07/11/2006                            DOB:          [birth year]    Mr. Jewel returns today for followup.  I saw him initially back in  October as he was a referral by Dr. Tillman Abide for establishment of  evaluation of atrial fibrillation and hypertension.  He returns today  for followup.  He has done well.  He is retired and he has very much  enjoyed living in [place].  He states that he has not been  exercising as much as he would like but promises to do so and has been  doing lots of gardening.   EXAMINATION TODAY:  GENERAL:  He is a pleasant, well-appearing middle-  aged man who looks younger than his stated age.  VITAL SIGNS:  Blood pressure of 137/77, the pulse was 72 and irregular,  the respirations were 18, the weight was 188 pounds up 6 pounds from his  visit in October.  NECK:  Revealed no jugular venous distention.  LUNGS:  Clear bilaterally to auscultation.  There were no wheezes,  rales, or rhonchi.  CARDIOVASCULAR:  Revealed an irregularly irregular rhythm with normal S1  and S2.  EXTREMITIES:  Demonstrated no edema.   His EKG demonstrates atrial fibrillation with controlled ventricular  response at 72 beats per minute.   IMPRESSION:  1. Chronic atrial fibrillation.  2. Chronic Coumadin therapy.  3. Hypertension.   DISCUSSION:  Overall, Mr. Macdonnell is stable and his atrial  fibrillation has been well controlled.  I have recommended that he  continue with his active lifestyle, continue his medications, and try to  push back from the table and lose a few pounds.  We will plan to see him  back in the office in 1 year.     Doylene Canning. Ladona Ridgel, MD  Electronically Signed    GWT/MedQ  DD: 07/11/2006  DT: 07/11/2006  Job #: [id number]   cc:   Karie Schwalbe, MD

## 2010-08-21 NOTE — Assessment & Plan Note (Signed)
[facility]                           ELECTROPHYSIOLOGY OFFICE NOTE   NAME:COLLUMBELLKeatyn, Jawad                   MRN:          [id number]  DATE:01/28/2006                            DOB:          April 12, 1950    Mr. Botts was referred by Dr. Tillman Abide to establish care for his  atrial fibrillation and other cardiac problems.   HISTORY OF PRESENT ILLNESS:  The patient is a very pleasant, 60 year old man  who initially developed atrial fibrillation approximately 10 years ago.  He  had been tried on multiple medications including sotalol, flecainide,  quinidine along with verapamil and beta blockers. Approximately two years  ago after multiple attempts and reattempts at storing and maintaining sinus  rhythm, he was elected to continue with a rate control strategy utilizing  low-dose beta blocker and Coumadin.  The patient has been monitored very  carefully with holters demonstrating controlled ventricular rates and atrial  fibrillation with an average rate in the 70s.  The patient overall is well.  He denies chest pain or shortness of breath.  He had catheterization back in  May 2005 demonstrating angiographically normal coronary arteries and normal  left ventricular systolic function.  He has been maintained on Coumadin  without particular difficulty.   PAST MEDICAL HISTORY:  Additional past medical history is notable for:  1. Hypertension.  2. He has history of surgery on his thumb and his right eye in the past.   SOCIAL HISTORY:  The patient is married.  He denies tobacco. He does  occasionally drink alcoholic beverages.  He has history of exercising but  has not started exercising again since moving from [place] to [place].   FAMILY HISTORY:  Notable for mother who is still alive and father who died  at age 80 of complications of cirrhosis.   MEDICATIONS:  1. Toprol-XL taken in divided doses 12.5 twice a day.  2. Warfarin  taking between 45 and 48 mg per week.   REVIEW OF SYSTEMS:  Negative except as noted in the HPI.   PHYSICAL EXAMINATION:  GENERAL:  He is a pleasant, well-appearing, middle-  aged man in no acute distress.  VITAL SIGNS:  Blood pressure today was 125/79, pulse 107 and irregular,  respirations 18, weight 182 pounds.  HEENT: Normocephalic, atraumatic.  Pupils equal and round. Oropharynx moist.  Sclerae anicteric.  NECK:  Revealed no jugular venous distension.  No thyromegaly.  Trachea  midline.  Carotids are 2+ and symmetric.  LUNGS:  Clear bilaterally to auscultation.  No wheezes, rales or rhonchi.  There was no __________ breathing.  CARDIOVASCULAR:  Irregularly irregular rhythm with normal S1 and S2.  I did  not appreciate any murmurs, rubs, or gallops.  ABDOMEN:  Soft, nontender, nondistended.  There was no organomegaly.  Bowel  sounds present.  No rebound or guarding.  EXTREMITIES:  Demonstrated no clubbing, cyanosis or edema.  Pulses 2+ and  symmetric.  NEUROLOGIC: Alert and oriented x3.  Cranial nerves intact.  Strength is 5/5  and symmetric.   The EKG demonstrates atrial fibrillation with a controlled ventricular  response.  IMPRESSION:  1. Chronic atrial fibrillation.  2. Chronic Coumadin therapy.  3. Hypertension.   DISCUSSION:  Overall Mr. Wilmes is stable. His atrial fibrillation is  controlled.  He will continue low dose beta blockers as well as Coumadin for  thromboembolic prevention.  I plan to see him back in six months.  He is  instructed to maintain a low sodium diet and exercise on a regular basis.    ______________________________  Doylene Canning. Ladona Ridgel, MD    GWT/MedQ  DD: 01/28/2006  DT: 01/30/2006  Job #: [id number]   cc:   Karie Schwalbe, MD

## 2010-09-30 ENCOUNTER — Ambulatory Visit (INDEPENDENT_AMBULATORY_CARE_PROVIDER_SITE_OTHER): Payer: 59 | Admitting: Internal Medicine

## 2010-09-30 DIAGNOSIS — I4891 Unspecified atrial fibrillation: Secondary | ICD-10-CM

## 2010-09-30 DIAGNOSIS — Z7901 Long term (current) use of anticoagulants: Secondary | ICD-10-CM

## 2010-09-30 DIAGNOSIS — Z5181 Encounter for therapeutic drug level monitoring: Secondary | ICD-10-CM

## 2010-09-30 NOTE — Patient Instructions (Signed)
Continue current dose, check in 6 weeks

## 2010-10-19 ENCOUNTER — Other Ambulatory Visit: Payer: Self-pay | Admitting: Internal Medicine

## 2010-11-11 ENCOUNTER — Ambulatory Visit: Payer: 59

## 2010-11-18 ENCOUNTER — Ambulatory Visit (INDEPENDENT_AMBULATORY_CARE_PROVIDER_SITE_OTHER): Payer: 59 | Admitting: Internal Medicine

## 2010-11-18 DIAGNOSIS — Z7901 Long term (current) use of anticoagulants: Secondary | ICD-10-CM

## 2010-11-18 DIAGNOSIS — Z5181 Encounter for therapeutic drug level monitoring: Secondary | ICD-10-CM

## 2010-11-18 DIAGNOSIS — I4891 Unspecified atrial fibrillation: Secondary | ICD-10-CM

## 2010-11-18 LAB — POCT INR: INR: 2.4

## 2010-11-18 NOTE — Patient Instructions (Signed)
Continue current dose, check in 4 weeks  

## 2010-11-30 ENCOUNTER — Other Ambulatory Visit: Payer: Self-pay | Admitting: Internal Medicine

## 2010-12-30 ENCOUNTER — Ambulatory Visit (INDEPENDENT_AMBULATORY_CARE_PROVIDER_SITE_OTHER): Payer: 59

## 2010-12-30 ENCOUNTER — Ambulatory Visit: Payer: 59

## 2010-12-30 DIAGNOSIS — Z23 Encounter for immunization: Secondary | ICD-10-CM

## 2010-12-31 ENCOUNTER — Ambulatory Visit: Payer: 59

## 2011-01-04 ENCOUNTER — Ambulatory Visit (INDEPENDENT_AMBULATORY_CARE_PROVIDER_SITE_OTHER): Payer: 59 | Admitting: Internal Medicine

## 2011-01-04 DIAGNOSIS — I4891 Unspecified atrial fibrillation: Secondary | ICD-10-CM

## 2011-01-04 DIAGNOSIS — Z5181 Encounter for therapeutic drug level monitoring: Secondary | ICD-10-CM

## 2011-01-04 DIAGNOSIS — Z7901 Long term (current) use of anticoagulants: Secondary | ICD-10-CM

## 2011-01-04 NOTE — Patient Instructions (Signed)
Continue current dose, check in 4 weeks  

## 2011-01-23 ENCOUNTER — Ambulatory Visit (INDEPENDENT_AMBULATORY_CARE_PROVIDER_SITE_OTHER): Payer: 59 | Admitting: Family Medicine

## 2011-01-23 VITALS — BP 130/80 | HR 59 | Temp 97.8°F | Wt 185.0 lb

## 2011-01-23 DIAGNOSIS — J029 Acute pharyngitis, unspecified: Secondary | ICD-10-CM

## 2011-01-23 DIAGNOSIS — J069 Acute upper respiratory infection, unspecified: Secondary | ICD-10-CM

## 2011-01-23 LAB — POCT RAPID STREP A (OFFICE): Rapid Strep A Screen: NEGATIVE

## 2011-01-23 NOTE — Assessment & Plan Note (Signed)
With sore throat and nasal symptoms and mild cough  Rapid strep neg  Disc fluids/ rest/ sympt care - see inst  Update if worse or if not improved

## 2011-01-23 NOTE — Patient Instructions (Signed)
Rapid strep test is negative today I think you have a head and chest cold  Tylenol is ok for throat pain of fever Plain mucinex for congestion  Nasal saline spray for nasal symptoms If much worse or if not improving in a week please follow up

## 2011-01-23 NOTE — Progress Notes (Signed)
Subjective:    Patient ID: Rodney Gray, male    DOB: 07-04-1950, 60 y.o.   MRN: [id number]  HPI Here for sore throat  Watched his grandaughter who had a cold  Rapid strep today is neg  Started with ST wed am - is a 4/10 and then had a bit of prod cough this am (yellow )  A little stuffy now- not runny Sleeping propped up   Feels hot at times - wonders if he had a fever No chills or aches No headache  Patient Active Problem List  Diagnoses  . HYPERLIPIDEMIA  . HYPERTENSION  . ATRIAL FIBRILLATION  . ERECTILE DYSFUNCTION, ORGANIC  . OSTEOARTHRITIS  . ACHILLES TENDINITIS  . DYSPNEA ON EXERTION  . Routine general medical examination at a health care facility  . Viral URI   Past Medical History  Diagnosis Date  . Hypertension   . Osteoarthritis   . Hyperlipemia   . Atrial fibrillation     A-Fib, 1987, 2003, 2005, EPS RX 2005 (A-Fib reocurred)  . Cataract    Past Surgical History  Procedure Date  . Retinal tear repair cryotherapy     right 12/06  . Thumb arthroscopy 1999  . Retinal tear repair cryotherapy     Retinal tear/bleed- Right 12/06  . Capsulotomy 08/2007    bilaterally  . Squamous cell carcinoma excision 07/2008    left forearm  . Meniscus repair 6/11    left knee---Dr Blackmon   History  Substance Use Topics  . Smoking status: Never Smoker   . Smokeless tobacco: Not on file  . Alcohol Use: Yes     occ.   Family History  Problem Relation Age of Onset  . Hypertension Father   . Coronary artery disease Neg Hx   . Diabetes Neg Hx   . Cancer Neg Hx     prostate or colon   Allergies  Allergen Reactions  . Ampicillin     REACTION: hives  . Quinidine     REACTION: hearing loss, photosensitivity   Current Outpatient Prescriptions on File Prior to Visit  Medication Sig Dispense Refill  . clindamycin (CLEOCIN) 150 MG capsule Prior to Dental apts      . fish oil-omega-3 fatty acids 1000 MG capsule Take 1,200 mg by mouth daily.       Marland Kitchen  glucosamine-chondroitin 500-400 MG tablet Take 1 tablet by mouth 1 dose over 46 hours.       . metoprolol succinate (TOPROL-XL) 25 MG 24 hr tablet TAKE ONE-HALF (1/2) TABLET TWICE A DAY  90 tablet  1  . Multiple Vitamins-Minerals (MENS MULTIVITAMIN PLUS PO) Take by mouth daily.        Marland Kitchen warfarin (COUMADIN) 5 MG tablet TAKE AS DIRECTED  135 tablet  1  . Cholecalciferol (VITAMIN D3) 1000 UNITS CAPS Take by mouth.            Review of Systems Review of Systems  Constitutional: Negative for fever, appetite change, fatigue and unexpected weight change.  Eyes: Negative for pain and visual disturbance.  ENT neg for ear pain or severe sore throat or sinus pain  Respiratory: Negative for shortness of breath or wheezing   Cardiovascular: Negative for cp or palpitations    Gastrointestinal: Negative for nausea, diarrhea and constipation.  Genitourinary: Negative for urgency and frequency.  Skin: Negative for pallor or rash   Neurological: Negative for weakness, light-headedness, numbness and headaches.  Hematological: Negative for adenopathy. Does not bruise/bleed easily.  Psychiatric/Behavioral: Negative  for dysphoric mood. The patient is not nervous/anxious.          Objective:   Physical Exam  Constitutional: He appears well-developed and well-nourished. No distress.  HENT:  Head: Normocephalic and atraumatic.  Right Ear: External ear normal.  Left Ear: External ear normal.       Nares are injected and congested   No sinus tenderness Post nasal drip noted   Eyes: Conjunctivae and EOM are normal. Pupils are equal, round, and reactive to light. Right eye exhibits no discharge. Left eye exhibits no discharge.  Neck: Normal range of motion. Neck supple. No JVD present. Carotid bruit is not present. No thyromegaly present.  Cardiovascular: Normal rate and normal heart sounds.   Pulmonary/Chest: Effort normal and breath sounds normal. No respiratory distress. He has no wheezes. He has no  rales. He exhibits no tenderness.       Harsh bs at bases with good air exch   Musculoskeletal: He exhibits no edema.  Lymphadenopathy:    He has no cervical adenopathy.  Neurological: He is alert.  Skin: Skin is warm and dry. No rash noted. No pallor.  Psychiatric: He has a normal mood and affect.          Assessment & Plan:

## 2011-02-04 ENCOUNTER — Encounter: Payer: Self-pay | Admitting: Internal Medicine

## 2011-02-04 ENCOUNTER — Ambulatory Visit (INDEPENDENT_AMBULATORY_CARE_PROVIDER_SITE_OTHER): Payer: 59 | Admitting: Internal Medicine

## 2011-02-04 VITALS — BP 109/59 | HR 60 | Temp 98.0°F | Ht 68.0 in | Wt 185.0 lb

## 2011-02-04 DIAGNOSIS — J019 Acute sinusitis, unspecified: Secondary | ICD-10-CM

## 2011-02-04 MED ORDER — CEFUROXIME AXETIL 500 MG PO TABS: 500.0000 mg | ORAL_TABLET | Freq: Two times a day (BID) | ORAL | Status: AC

## 2011-02-04 NOTE — Progress Notes (Signed)
Subjective:    Patient ID: Rodney Gray, male    DOB: Dec 01, 1950, 60 y.o.   MRN: [id number]  HPI Still sick over 2 weeks Still has purulent nasal discharge Now coughing mostly clear stuff, except at night when he has post nasal drip  Bad head congestion Uses nasal saline and drainage with yellow, green and bloody drainage  Tried mucinex but felt it increased his pulse  Low grade fever for a while--better in past couple of days No ear pain   Current Outpatient Prescriptions on File Prior to Visit  Medication Sig Dispense Refill  . clindamycin (CLEOCIN) 150 MG capsule Prior to [facility]      . fish oil-omega-3 fatty acids 1000 MG capsule Take 1,200 mg by mouth daily.       Marland Kitchen glucosamine-chondroitin 500-400 MG tablet Take 1 tablet by mouth 1 dose over 46 hours.       . metoprolol succinate (TOPROL-XL) 25 MG 24 hr tablet TAKE ONE-HALF (1/2) TABLET TWICE A DAY  90 tablet  1  . Multiple Vitamins-Minerals (MENS MULTIVITAMIN PLUS PO) Take by mouth daily.        Marland Kitchen warfarin (COUMADIN) 5 MG tablet TAKE AS DIRECTED  135 tablet  1    Allergies  Allergen Reactions  . Ampicillin     REACTION: hives  . Quinidine     REACTION: hearing loss, photosensitivity    Past Medical History  Diagnosis Date  . Hypertension   . Osteoarthritis   . Hyperlipemia   . Atrial fibrillation     A-Fib, 1987, 2003, 2005, EPS RX 2005 (A-Fib reocurred)  . Cataract     Past Surgical History  Procedure Date  . Retinal tear repair cryotherapy     right 12/06  . Thumb arthroscopy 1999  . Retinal tear repair cryotherapy     Retinal tear/bleed- Right 12/06  . Capsulotomy 08/2007    bilaterally  . Squamous cell carcinoma excision 07/2008    left forearm  . Meniscus repair 6/11    left knee---Dr Blackmon    Family History  Problem Relation Age of Onset  . Hypertension Father   . Coronary artery disease Neg Hx   . Diabetes Neg Hx   . Cancer Neg Hx     prostate or colon    History    Social History  . Marital Status: Married    Spouse Name: N/A    Number of Children: N/A  . Years of Education: N/A   Occupational History  . retired Set designer     Social History Main Topics  . Smoking status: Never Smoker   . Smokeless tobacco: Never Used  . Alcohol Use: Yes     occ.  . Drug Use: Not on file  . Sexually Active: Not on file   Other Topics Concern  . Not on file   Social History Narrative  . No narrative on file   Review of Systems Joints are aching No nausea or vomiting No diarrhea Eating okay     Objective:   Physical Exam  Constitutional: He appears well-developed and well-nourished. No distress.  HENT:  Head: Normocephalic and atraumatic.  Right Ear: External ear normal.  Left Ear: External ear normal.  Mouth/Throat: Oropharynx is clear and moist. No oropharyngeal exudate.       Moderate inflammation esp in right nare Some bleeding spots on left  Neck: Normal range of motion. Neck supple.  Pulmonary/Chest: Effort normal and breath sounds normal. No respiratory  distress. He has no wheezes. He has no rales.  Lymphadenopathy:    He has no cervical adenopathy.          Assessment & Plan:

## 2011-02-04 NOTE — Assessment & Plan Note (Signed)
Clearly seems to have a secondary bacterial infection Will use ceftin---has oral and parenteral cephalosporins in past without reaction Tylenol prn

## 2011-02-08 ENCOUNTER — Ambulatory Visit (INDEPENDENT_AMBULATORY_CARE_PROVIDER_SITE_OTHER): Payer: 59 | Admitting: Internal Medicine

## 2011-02-08 DIAGNOSIS — I4891 Unspecified atrial fibrillation: Secondary | ICD-10-CM

## 2011-02-08 DIAGNOSIS — Z5181 Encounter for therapeutic drug level monitoring: Secondary | ICD-10-CM

## 2011-02-08 DIAGNOSIS — Z7901 Long term (current) use of anticoagulants: Secondary | ICD-10-CM

## 2011-02-08 NOTE — Patient Instructions (Signed)
Continue 5 mg qd, 7.5mg  MWF recheck 4 weeks

## 2011-03-12 ENCOUNTER — Ambulatory Visit (INDEPENDENT_AMBULATORY_CARE_PROVIDER_SITE_OTHER): Payer: 59 | Admitting: Internal Medicine

## 2011-03-12 DIAGNOSIS — Z7901 Long term (current) use of anticoagulants: Secondary | ICD-10-CM

## 2011-03-12 DIAGNOSIS — Z5181 Encounter for therapeutic drug level monitoring: Secondary | ICD-10-CM

## 2011-03-12 DIAGNOSIS — I4891 Unspecified atrial fibrillation: Secondary | ICD-10-CM

## 2011-03-12 NOTE — Patient Instructions (Signed)
Continue 5 mg qd, 7.5mg MWF recheck 4 weeks 

## 2011-03-25 ENCOUNTER — Other Ambulatory Visit: Payer: Self-pay | Admitting: Internal Medicine

## 2011-04-23 ENCOUNTER — Ambulatory Visit (INDEPENDENT_AMBULATORY_CARE_PROVIDER_SITE_OTHER): Payer: 59 | Admitting: Internal Medicine

## 2011-04-23 DIAGNOSIS — I4891 Unspecified atrial fibrillation: Secondary | ICD-10-CM

## 2011-04-23 DIAGNOSIS — Z5181 Encounter for therapeutic drug level monitoring: Secondary | ICD-10-CM

## 2011-04-23 DIAGNOSIS — Z7901 Long term (current) use of anticoagulants: Secondary | ICD-10-CM

## 2011-04-23 NOTE — Patient Instructions (Signed)
Continue current dose, check in 6 weeks  

## 2011-05-17 ENCOUNTER — Encounter: Payer: Self-pay | Admitting: Family Medicine

## 2011-05-17 ENCOUNTER — Ambulatory Visit (INDEPENDENT_AMBULATORY_CARE_PROVIDER_SITE_OTHER): Payer: 59 | Admitting: Family Medicine

## 2011-05-17 DIAGNOSIS — J019 Acute sinusitis, unspecified: Secondary | ICD-10-CM

## 2011-05-17 DIAGNOSIS — Z7901 Long term (current) use of anticoagulants: Secondary | ICD-10-CM

## 2011-05-17 DIAGNOSIS — J0100 Acute maxillary sinusitis, unspecified: Secondary | ICD-10-CM

## 2011-05-17 MED ORDER — CEFDINIR 300 MG PO CAPS: 600.0000 mg | ORAL_CAPSULE | Freq: Every day | ORAL | Status: AC

## 2011-05-17 NOTE — Progress Notes (Signed)
  Patient Name: Rodney Gray Date of Birth: [birth year] Age: 61 y.o. Medical Record Number: [id number] Gender: male Date of Encounter: 05/17/2011  History of Present Illness:  Rodney Gray is a 61 y.o. very pleasant male patient who presents with the following:  Acute sinusitis:  Sinus Pain: Patient complains of congestion, facial pain, headache described as left frontal, nasal congestion, post nasal drip, purulent nasal discharge, sinus pressure and tooth pain. Symptoms include above with no fever, chills, night sweats or weight loss. Onset of symptoms was 6 days ago, gradually worsening since that time. He is drinking plenty of fluids.  Past history is significant for multiple sinusitis and deviated septum.. Patient is non-smoker   Past Medical History, Surgical History, Social History, Family History, Problem List, Medications, and Allergies have been reviewed and updated if relevant.  Review of Systems: ROS: GEN: Acute illness details above GI: Tolerating PO intake GU: maintaining adequate hydration and urination Pulm: No SOB Interactive and getting along well at home.  Otherwise, ROS is as per the HPI.   Physical Examination: Filed Vitals:   05/17/11 1129  BP: 120/70  Pulse: 68  Temp: 97.8 F (36.6 C)  TempSrc: Oral  Height: 5\' 8"  (1.727 m)  Weight: 185 lb 12.8 oz (84.278 kg)  SpO2: 99%    Body mass index is 28.25 kg/(m^2).   Gen: WDWN, NAD; alert,appropriate and cooperative throughout exam  HEENT: Normocephalic and atraumatic. Throat clear, w/o exudate, no LAD, R TM clear, L TM - good landmarks, No fluid present. rhinnorhea.  Left frontal and maxillary sinuses: Tender max and frontal Right frontal and maxillary sinuses: Tender  Neck: No ant or post LAD CV: RRR, No M/G/R Pulm: Breathing comfortably in no resp distress. no w/c/r Abd: S,NT,ND,+BS Extr: no c/c/e Psych: full affect, pleasant   Assessment and Plan: 1. Acute sinusitis  cefdinir  (OMNICEF) 300 MG capsule  2. Maxillary sinusitis, acute  cefdinir (OMNICEF) 300 MG capsule  3. Chronic anticoagulation      ABX Supportive care  Recheck coumadin next mon

## 2011-05-24 ENCOUNTER — Ambulatory Visit (INDEPENDENT_AMBULATORY_CARE_PROVIDER_SITE_OTHER): Payer: 59 | Admitting: Internal Medicine

## 2011-05-24 DIAGNOSIS — I4891 Unspecified atrial fibrillation: Secondary | ICD-10-CM

## 2011-05-24 DIAGNOSIS — Z5181 Encounter for therapeutic drug level monitoring: Secondary | ICD-10-CM

## 2011-05-24 DIAGNOSIS — Z7901 Long term (current) use of anticoagulants: Secondary | ICD-10-CM

## 2011-05-24 NOTE — Patient Instructions (Signed)
Continue current dose, check in 4 weeks  

## 2011-05-25 ENCOUNTER — Other Ambulatory Visit: Payer: Self-pay | Admitting: Internal Medicine

## 2011-05-31 ENCOUNTER — Ambulatory Visit: Payer: 59

## 2011-06-23 ENCOUNTER — Other Ambulatory Visit: Payer: Self-pay | Admitting: Internal Medicine

## 2011-07-05 ENCOUNTER — Ambulatory Visit (INDEPENDENT_AMBULATORY_CARE_PROVIDER_SITE_OTHER): Payer: 59 | Admitting: Internal Medicine

## 2011-07-05 DIAGNOSIS — I4891 Unspecified atrial fibrillation: Secondary | ICD-10-CM

## 2011-07-05 DIAGNOSIS — Z7901 Long term (current) use of anticoagulants: Secondary | ICD-10-CM

## 2011-07-05 DIAGNOSIS — Z5181 Encounter for therapeutic drug level monitoring: Secondary | ICD-10-CM

## 2011-07-05 LAB — POCT INR: INR: 3.1

## 2011-07-05 NOTE — Patient Instructions (Signed)
Continue 5 mg qd, 7.5mg  MWF recheck 6 weeks

## 2011-08-05 ENCOUNTER — Ambulatory Visit (INDEPENDENT_AMBULATORY_CARE_PROVIDER_SITE_OTHER): Payer: 59 | Admitting: Internal Medicine

## 2011-08-05 ENCOUNTER — Encounter: Payer: Self-pay | Admitting: Internal Medicine

## 2011-08-05 VITALS — BP 108/60 | HR 73 | Temp 97.8°F | Wt 186.0 lb

## 2011-08-05 DIAGNOSIS — J019 Acute sinusitis, unspecified: Secondary | ICD-10-CM

## 2011-08-05 MED ORDER — FLUTICASONE PROPIONATE 50 MCG/ACT NA SUSP: 2.0000 | Freq: Every day | NASAL | Status: DC

## 2011-08-05 MED ORDER — CEFDINIR 300 MG PO CAPS: 300.0000 mg | ORAL_CAPSULE | Freq: Two times a day (BID) | ORAL | Status: AC

## 2011-08-05 NOTE — Assessment & Plan Note (Signed)
recurrence Watches grandkids and gets their illnesses May be partially obstructed by polyp Will treat with fluticasone Cefdinir again

## 2011-08-05 NOTE — Progress Notes (Signed)
Subjective:    Patient ID: Rodney Gray, male    DOB: Oct 21, 1950, 61 y.o.   MRN: [id number]  HPI Having sinus symptoms again Watching grandkids and they had been sick  Now with 4-5 days of illness Sore throat, laryngitis at first Now with copious purulent nasal secretions--yellow Left frontal and maxillary tenderness No ear pain Lots of cough---esp at night. Relates to post nasal drip Has been sleeping propped to help No SOB  No meds for this other than nasal saline  Current Outpatient Prescriptions on File Prior to Visit  Medication Sig Dispense Refill  . clindamycin (CLEOCIN) 150 MG capsule Prior to [facility]      . fish oil-omega-3 fatty acids 1000 MG capsule Take 1,200 mg by mouth daily.       Marland Kitchen glucosamine-chondroitin 500-400 MG tablet Take 1 tablet by mouth 1 dose over 46 hours.       . metoprolol succinate (TOPROL-XL) 25 MG 24 hr tablet TAKE ONE-HALF (1/2) TABLET TWICE A DAY  90 tablet  0  . Multiple Vitamins-Minerals (MENS MULTIVITAMIN PLUS PO) Take by mouth daily.        Marland Kitchen warfarin (COUMADIN) 5 MG tablet TAKE AS DIRECTED  135 tablet  0    Allergies  Allergen Reactions  . Ampicillin     REACTION: hives Has tolerated cephalosporins  . Quinidine     REACTION: hearing loss, photosensitivity    Past Medical History  Diagnosis Date  . Hypertension   . Osteoarthritis   . Hyperlipemia   . Atrial fibrillation     A-Fib, 1987, 2003, 2005, EPS RX 2005 (A-Fib reocurred)  . Cataract     Past Surgical History  Procedure Date  . Retinal tear repair cryotherapy     right 12/06  . Thumb arthroscopy 1999  . Retinal tear repair cryotherapy     Retinal tear/bleed- Right 12/06  . Capsulotomy 08/2007    bilaterally  . Squamous cell carcinoma excision 07/2008    left forearm  . Meniscus repair 6/11    left knee---Dr Blackmon    Family History  Problem Relation Age of Onset  . Hypertension Father   . Coronary artery disease Neg Hx   . Diabetes Neg Hx   .  Cancer Neg Hx     prostate or colon    History   Social History  . Marital Status: Married    Spouse Name: N/A    Number of Children: N/A  . Years of Education: N/A   Occupational History  . retired Set designer     Social History Main Topics  . Smoking status: Never Smoker   . Smokeless tobacco: Never Used  . Alcohol Use: Yes     occ.  . Drug Use: Not on file  . Sexually Active: Not on file   Other Topics Concern  . Not on file   Social History Narrative  . No narrative on file   Review of Systems Cut grass without mask last week---may have worsened inflammation No rash    Objective:   Physical Exam  Constitutional: He appears well-developed and well-nourished. No distress.  HENT:  Mouth/Throat: Oropharynx is clear and moist. No oropharyngeal exudate.       Moderate left frontal tenderness Moderate nasal congestion--probable polyp on left TMs normal  Neck: Normal range of motion. Neck supple.  Pulmonary/Chest: Effort normal and breath sounds normal. No respiratory distress. He has no wheezes. He has no rales.  Lymphadenopathy:  He has no cervical adenopathy.          Assessment & Plan:

## 2011-08-11 ENCOUNTER — Ambulatory Visit (INDEPENDENT_AMBULATORY_CARE_PROVIDER_SITE_OTHER): Payer: 59 | Admitting: Internal Medicine

## 2011-08-11 ENCOUNTER — Encounter: Payer: Self-pay | Admitting: Internal Medicine

## 2011-08-11 VITALS — BP 136/72 | HR 67 | Ht 68.0 in | Wt 187.8 lb

## 2011-08-11 DIAGNOSIS — I10 Essential (primary) hypertension: Secondary | ICD-10-CM

## 2011-08-11 DIAGNOSIS — I4891 Unspecified atrial fibrillation: Secondary | ICD-10-CM

## 2011-08-11 MED ORDER — DILTIAZEM HCL ER COATED BEADS 180 MG PO CP24: 180.0000 mg | ORAL_CAPSULE | Freq: Every day | ORAL | Status: DC

## 2011-08-11 NOTE — Assessment & Plan Note (Signed)
His blood pressure remains borderline. He will continue blood pressure lowering medications switching to a calcium channel blocker.

## 2011-08-11 NOTE — Patient Instructions (Signed)
Your physician wants you to follow-up in: 6 months with Dr Court Joy will receive a reminder letter in the mail two months in advance. If you don't receive a letter, please call our office to schedule the follow-up appointment.  Your physician has recommended you make the following change in your medication:  1) Stop Warfarin 2) Stop Metoprolol 3) Start Aspirin 81mg  daily 4) Start Cartia 180mg  daily

## 2011-08-11 NOTE — Assessment & Plan Note (Signed)
Today we discussed changing medical therapy. We will switch him to a calcium channel blocker in hopes of preventing some of the side effects that he is experiencing with his beta blocker. In addition, with a low embolic risk, I recommended the patient stop warfarin and take aspirin 81 mg daily.

## 2011-08-11 NOTE — Progress Notes (Signed)
HPI [name] returns today for followup. He is a very pleasant 61 year old man with a history of chronic atrial fibrillation and borderline hypertension. When I saw the patient last, a year ago, I recommended that he discontinue taking warfarin as his embolic risk was very low.  Since then he has been stable but notes feeling cold and having fatigue and weakness. He thinks this is related to his beta blocker. He's had no syncope and denies chest pain or shortness of breath. Allergies  Allergen Reactions  . Ampicillin     REACTION: hives Has tolerated cephalosporins  . Quinidine     REACTION: hearing loss, photosensitivity     Current Outpatient Prescriptions  Medication Sig Dispense Refill  . cefdinir (OMNICEF) 300 MG capsule Take 1 capsule (300 mg total) by mouth 2 (two) times daily.  20 capsule  0  . cefdinir (OMNICEF) 300 MG capsule Take 300 mg by mouth Daily. For ten days       . clindamycin (CLEOCIN) 150 MG capsule Prior to [facility]      . fish oil-omega-3 fatty acids 1000 MG capsule Take 1,200 mg by mouth daily.       . fluticasone (FLONASE) 50 MCG/ACT nasal spray Place 2 sprays into the nose daily. In each nostril  16 g  12  . Multiple Vitamins-Minerals (MENS MULTIVITAMIN PLUS PO) Take by mouth daily.        Marland Kitchen glucosamine-chondroitin 500-400 MG tablet Take 1 tablet by mouth 1 day or 1 dose. 1500 mg         Past Medical History  Diagnosis Date  . Hypertension   . Osteoarthritis   . Hyperlipemia   . Atrial fibrillation     A-Fib, 1987, 2003, 2005, EPS RX 2005 (A-Fib reocurred)  . Cataract     ROS:   All systems reviewed and negative except as noted in the HPI.   Past Surgical History  Procedure Date  . Retinal tear repair cryotherapy     right 12/06  . Thumb arthroscopy 1999  . Retinal tear repair cryotherapy     Retinal tear/bleed- Right 12/06  . Capsulotomy 08/2007    bilaterally  . Squamous cell carcinoma excision 07/2008    left forearm  . Meniscus  repair 6/11    left knee---Dr Blackmon     Family History  Problem Relation Age of Onset  . Hypertension Father   . Coronary artery disease Neg Hx   . Diabetes Neg Hx   . Cancer Neg Hx     prostate or colon     History   Social History  . Marital Status: Married    Spouse Name: N/A    Number of Children: N/A  . Years of Education: N/A   Occupational History  . retired Set designer     Social History Main Topics  . Smoking status: Never Smoker   . Smokeless tobacco: Never Used  . Alcohol Use: Yes     occ.  . Drug Use: Not on file  . Sexually Active: Not on file   Other Topics Concern  . Not on file   Social History Narrative  . No narrative on file     BP 136/72  Pulse 67  Ht 5\' 8"  (1.727 m)  Wt 187 lb 12.8 oz (85.186 kg)  BMI 28.56 kg/m2  Physical Exam:  Well appearing middle-aged man, NAD HEENT: Unremarkable Neck:  No JVD, no thyromegally Lungs:  Clear with no wheezes, rales,  or rhonchi. HEART:  Regular rate rhythm, no murmurs, no rubs, no clicks Abd:  soft, positive bowel sounds, no organomegally, no rebound, no guarding Ext:  2 plus pulses, no edema, no cyanosis, no clubbing Skin:  No rashes no nodules Neuro:  CN II through XII intact, motor grossly intact  EKg Atrial fibrillation with a controlled ventricular response  Assess/Plan:

## 2011-08-20 ENCOUNTER — Ambulatory Visit: Payer: 59

## 2011-08-20 ENCOUNTER — Encounter: Payer: Self-pay | Admitting: Internal Medicine

## 2011-08-20 ENCOUNTER — Ambulatory Visit (INDEPENDENT_AMBULATORY_CARE_PROVIDER_SITE_OTHER): Payer: 59 | Admitting: Internal Medicine

## 2011-08-20 VITALS — BP 108/68 | HR 60 | Temp 98.1°F | Ht 68.0 in | Wt 185.0 lb

## 2011-08-20 DIAGNOSIS — I4891 Unspecified atrial fibrillation: Secondary | ICD-10-CM

## 2011-08-20 DIAGNOSIS — I10 Essential (primary) hypertension: Secondary | ICD-10-CM

## 2011-08-20 DIAGNOSIS — J301 Allergic rhinitis due to pollen: Secondary | ICD-10-CM | POA: Insufficient documentation

## 2011-08-20 DIAGNOSIS — M1990 Unspecified osteoarthritis, unspecified site: Secondary | ICD-10-CM

## 2011-08-20 DIAGNOSIS — Z0000 Encounter for general adult medical examination without abnormal findings: Secondary | ICD-10-CM

## 2011-08-20 LAB — PSA: PSA: 1.17 ng/mL (ref 0.10–4.00)

## 2011-08-20 LAB — CBC WITH DIFFERENTIAL/PLATELET
Basophils Absolute: 0 10*3/uL (ref 0.0–0.1)
Basophils Relative: 0.5 % (ref 0.0–3.0)
Eosinophils Absolute: 0.1 10*3/uL (ref 0.0–0.7)
Hemoglobin: 14.1 g/dL (ref 13.0–17.0)
MCHC: 33.1 g/dL (ref 30.0–36.0)
MCV: 95.2 fl (ref 78.0–100.0)
Monocytes Absolute: 0.5 10*3/uL (ref 0.1–1.0)
Neutro Abs: 4 10*3/uL (ref 1.4–7.7)
Neutrophils Relative %: 67.7 % (ref 43.0–77.0)
RBC: 4.47 Mil/uL (ref 4.22–5.81)
RDW: 13.8 % (ref 11.5–14.6)

## 2011-08-20 LAB — HEPATIC FUNCTION PANEL
ALT: 18 U/L (ref 0–53)
Total Protein: 6.6 g/dL (ref 6.0–8.3)

## 2011-08-20 LAB — BASIC METABOLIC PANEL
CO2: 27 mEq/L (ref 19–32)
Chloride: 105 mEq/L (ref 96–112)
Creatinine, Ser: 0.8 mg/dL (ref 0.4–1.5)
Glucose, Bld: 91 mg/dL (ref 70–99)

## 2011-08-20 NOTE — Assessment & Plan Note (Signed)
Mostly in knees Happy with the glucosamine chondroitin

## 2011-08-20 NOTE — Assessment & Plan Note (Signed)
Generally healthy Counseling done Will check PSA

## 2011-08-20 NOTE — Assessment & Plan Note (Signed)
Good rate control  Now on CCB ASA now due to low risk

## 2011-08-20 NOTE — Assessment & Plan Note (Signed)
BP Readings from Last 3 Encounters:  08/20/11 108/68  08/11/11 136/72  08/05/11 108/60   Low now Doing well on new med (really for rate control)

## 2011-08-20 NOTE — Progress Notes (Signed)
Subjective:    Patient ID: Rodney Gray, male    DOB: 01/27/1951, 61 y.o.   MRN: [id number]  HPI Here for physical Recently changed the coumadin to aspirin due to low risk Metoprolol changed to CCB due to fatigue This may be some better now  Knows heart is irregular but doesn't feel it No problems with exercise tolerance since med change  Allergies controlled Uses the flonase  Uses glucosamine and chondroitin Satisfied with this  Current Outpatient Prescriptions on File Prior to Visit  Medication Sig Dispense Refill  . aspirin EC 81 MG tablet Take 1 tablet (81 mg total) by mouth daily.  90 tablet  3  . clindamycin (CLEOCIN) 150 MG capsule Prior to [facility]      . diltiazem (CARDIZEM CD) 180 MG 24 hr capsule Take 1 capsule (180 mg total) by mouth daily.  90 capsule  3  . fish oil-omega-3 fatty acids 1000 MG capsule Take 1,200 mg by mouth daily.       . fluticasone (FLONASE) 50 MCG/ACT nasal spray Place 2 sprays into the nose daily. In each nostril  16 g  12  . Multiple Vitamins-Minerals (MENS MULTIVITAMIN PLUS PO) Take by mouth daily.          Allergies  Allergen Reactions  . Ampicillin     REACTION: hives Has tolerated cephalosporins  . Quinidine     REACTION: hearing loss, photosensitivity    Past Medical History  Diagnosis Date  . Hypertension   . Osteoarthritis   . Hyperlipemia   . Atrial fibrillation     A-Fib, 1987, 2003, 2005, EPS RX 2005 (A-Fib reocurred)  . Cataract     Past Surgical History  Procedure Date  . Retinal tear repair cryotherapy     right 12/06  . Thumb arthroscopy 1999  . Retinal tear repair cryotherapy     Retinal tear/bleed- Right 12/06  . Capsulotomy 08/2007    bilaterally  . Squamous cell carcinoma excision 07/2008    left forearm  . Meniscus repair 6/11    left knee---Dr Blackmon    Family History  Problem Relation Age of Onset  . Hypertension Father   . Coronary artery disease Neg Hx   . Diabetes Neg Hx   .  Cancer Neg Hx     prostate or colon    History   Social History  . Marital Status: Married    Spouse Name: N/A    Number of Children: N/A  . Years of Education: N/A   Occupational History  . retired Set designer     Social History Main Topics  . Smoking status: Never Smoker   . Smokeless tobacco: Never Used  . Alcohol Use: Yes     occ.  . Drug Use: Not on file  . Sexually Active: Not on file   Other Topics Concern  . Not on file   Social History Narrative  . No narrative on file   Review of Systems  Constitutional: Negative for fatigue and unexpected weight change.       Wears seat belt  HENT: Positive for hearing loss, congestion, rhinorrhea, dental problem and sinus pressure. Negative for tinnitus.        Chronic hearing loss Regular with dentist--needs 1 tooth pulled  Eyes: Negative for visual disturbance.       No diplopia Due for routine check up  Respiratory: Negative for cough, chest tightness and shortness of breath.   Cardiovascular: Negative for chest pain,  palpitations and leg swelling.  Gastrointestinal: Negative for nausea, vomiting, abdominal pain, constipation and blood in stool.       Occ heartburn--tums helps  Genitourinary: Positive for urgency. Negative for difficulty urinating.       Sporadic nocturia No sexual problems  Musculoskeletal: Positive for arthralgias. Negative for back pain and joint swelling.       Feels knee pain is better on med Left 5th finger pain at times  Skin: Negative for rash.       Sees derm yearly  Neurological: Negative for dizziness, syncope, weakness, light-headedness, numbness and headaches.  Hematological: Negative for adenopathy. Does not bruise/bleed easily.       Less bruising on the aspirin  Psychiatric/Behavioral: Positive for sleep disturbance. Negative for dysphoric mood. The patient is not nervous/anxious.        Never sleeps great--snores if flat (so props up) Occ daytime somnolence         Objective:   Physical Exam  Constitutional: He is oriented to person, place, and time. He appears well-developed and well-nourished. No distress.  HENT:  Head: Normocephalic and atraumatic.  Right Ear: External ear normal.  Left Ear: External ear normal.  Mouth/Throat: Oropharynx is clear and moist. No oropharyngeal exudate.  Eyes: Conjunctivae and EOM are normal. Pupils are equal, round, and reactive to light.  Neck: Normal range of motion. Neck supple. No thyromegaly present.  Cardiovascular: Normal rate, regular rhythm, normal heart sounds and intact distal pulses.  Exam reveals no gallop.   No murmur heard. Pulmonary/Chest: Effort normal and breath sounds normal. No respiratory distress. He has no wheezes. He has no rales.  Abdominal: Soft. There is no tenderness.  Musculoskeletal: He exhibits no edema and no tenderness.  Lymphadenopathy:    He has no cervical adenopathy.  Neurological: He is alert and oriented to person, place, and time.  Skin: No rash noted. No erythema.  Psychiatric: He has a normal mood and affect. His behavior is normal.          Assessment & Plan:

## 2011-08-23 ENCOUNTER — Encounter: Payer: Self-pay | Admitting: *Deleted

## 2011-08-25 ENCOUNTER — Ambulatory Visit (INDEPENDENT_AMBULATORY_CARE_PROVIDER_SITE_OTHER): Payer: 59 | Admitting: Internal Medicine

## 2011-08-25 ENCOUNTER — Encounter: Payer: Self-pay | Admitting: Internal Medicine

## 2011-08-25 VITALS — BP 106/60 | HR 91 | Temp 98.4°F | Wt 181.0 lb

## 2011-08-25 DIAGNOSIS — K130 Diseases of lips: Secondary | ICD-10-CM

## 2011-08-25 NOTE — Progress Notes (Signed)
Subjective:    Patient ID: Rodney Gray, male    DOB: 10-01-1950, 61 y.o.   MRN: [id number]  HPI Has a new spot on the corner of his mouth Concerned due to recurrent thrush in his grandson (has needed fluconazole) Just noticed it last night Scratched last night---got slight blood  Has been using the flonase fairly regularly No fever  Not feeling ill  Current Outpatient Prescriptions on File Prior to Visit  Medication Sig Dispense Refill  . aspirin EC 81 MG tablet Take 1 tablet (81 mg total) by mouth daily.  90 tablet  3  . clindamycin (CLEOCIN) 150 MG capsule Prior to [facility]      . diltiazem (CARDIZEM CD) 180 MG 24 hr capsule Take 1 capsule (180 mg total) by mouth daily.  90 capsule  3  . fish oil-omega-3 fatty acids 1000 MG capsule Take 1,200 mg by mouth daily.       . fluticasone (FLONASE) 50 MCG/ACT nasal spray Place 2 sprays into the nose daily. In each nostril  16 g  12  . Glucosamine-Chondroitin 750-600 MG TABS Take 1 tablet by mouth daily.      . Multiple Vitamins-Minerals (MENS MULTIVITAMIN PLUS PO) Take by mouth daily.          Allergies  Allergen Reactions  . Ampicillin     REACTION: hives Has tolerated cephalosporins  . Quinidine     REACTION: hearing loss, photosensitivity    Past Medical History  Diagnosis Date  . Hypertension   . Osteoarthritis   . Hyperlipemia   . Atrial fibrillation     A-Fib, 1987, 2003, 2005, EPS RX 2005 (A-Fib reocurred)  . Cataract   . Allergic rhinitis due to pollen     Past Surgical History  Procedure Date  . Retinal tear repair cryotherapy     right 12/06  . Thumb arthroscopy 1999  . Retinal tear repair cryotherapy     Retinal tear/bleed- Right 12/06  . Capsulotomy 08/2007    bilaterally  . Squamous cell carcinoma excision 07/2008    left forearm  . Meniscus repair 6/11    left knee---Dr Blackmon    Family History  Problem Relation Age of Onset  . Hypertension Father   . Coronary artery disease Neg Hx     . Diabetes Neg Hx   . Cancer Neg Hx     prostate or colon    History   Social History  . Marital Status: Married    Spouse Name: N/A    Number of Children: N/A  . Years of Education: N/A   Occupational History  . retired Set designer     Social History Main Topics  . Smoking status: Never Smoker   . Smokeless tobacco: Never Used  . Alcohol Use: Yes     occ.  . Drug Use: Not on file  . Sexually Active: Not on file   Other Topics Concern  . Not on file   Social History Narrative  . No narrative on file     Review of Systems No oral lesions Some sinus pressure No trouble swallowing    Objective:   Physical Exam  Constitutional: He appears well-developed and well-nourished. No distress.  HENT:  Mouth/Throat: Oropharynx is clear and moist. No oropharyngeal exudate.  Skin:       Has mild irritation along left lower lip and in crease on that side          Assessment & Plan:

## 2011-08-25 NOTE — Assessment & Plan Note (Signed)
Will try zinc briefly and cortaid

## 2011-08-25 NOTE — Patient Instructions (Signed)
Please try an over the counter cortisone cream on your lips It may help to take an over the counter zinc supplement (either in multivitamin or alone) for 1 month or so

## 2012-01-05 ENCOUNTER — Ambulatory Visit (INDEPENDENT_AMBULATORY_CARE_PROVIDER_SITE_OTHER): Payer: 59

## 2012-01-05 DIAGNOSIS — Z23 Encounter for immunization: Secondary | ICD-10-CM

## 2012-02-29 ENCOUNTER — Ambulatory Visit: Payer: 59 | Admitting: Internal Medicine

## 2012-03-17 ENCOUNTER — Encounter: Payer: Self-pay | Admitting: Internal Medicine

## 2012-03-17 ENCOUNTER — Ambulatory Visit (INDEPENDENT_AMBULATORY_CARE_PROVIDER_SITE_OTHER): Payer: 59 | Admitting: Internal Medicine

## 2012-03-17 VITALS — BP 113/71 | HR 65 | Ht 68.0 in | Wt 183.0 lb

## 2012-03-17 DIAGNOSIS — I4891 Unspecified atrial fibrillation: Secondary | ICD-10-CM

## 2012-03-17 NOTE — Assessment & Plan Note (Addendum)
His ventricular rate appears to be well-controlled he is asymptomatic. No change in medical therapy today. His thromboembolic risk is very low.

## 2012-03-17 NOTE — Patient Instructions (Signed)
Your physician wants you to follow-up in: 1 year with Dr. Taylor. You will receive a reminder letter in the mail two months in advance. If you don't receive a letter, please call our office to schedule the follow-up appointment.  

## 2012-03-17 NOTE — Progress Notes (Signed)
HPI Mr. Dewoody returns today for followup. He is a very pleasant 61 year old man with hypertension and chronic atrial fibrillation. He has done well in the interim. His blood pressure is well controlled on low-dose calcium channel blockers. He denies palpitations. No syncope, cough, chest pain, or shortness of breath. No peripheral edema. Allergies  Allergen Reactions  . Ampicillin     REACTION: hives Has tolerated cephalosporins     Current Outpatient Prescriptions  Medication Sig Dispense Refill  . aspirin EC 81 MG tablet Take 1 tablet (81 mg total) by mouth daily.  90 tablet  3  . clindamycin (CLEOCIN) 150 MG capsule [facility] to [facility]      . diltiazem (CARDIZEM CD) 180 MG 24 hr capsule Take 1 capsule (180 mg total) by mouth daily.  90 capsule  3  . fish oil-omega-3 fatty acids 1000 MG capsule Take 1 g by mouth daily.       . fluticasone (FLONASE) 50 MCG/ACT nasal spray Place 2 sprays into the nose daily. In each nostril  16 g  12  . Glucosamine-Chondroitin 750-600 MG TABS Take 1 tablet by mouth daily.      . Multiple Vitamins-Minerals (MENS MULTIVITAMIN PLUS PO) Take by mouth daily.           Past Medical History  Diagnosis Date  . Hypertension   . Osteoarthritis   . Hyperlipemia   . Atrial fibrillation     A-Fib, 1987, 2003, 2005, EPS RX 2005 (A-Fib reocurred)  . Cataract   . Allergic rhinitis due to pollen     ROS:   All systems reviewed and negative except as noted in the HPI.   Past Surgical History  Procedure Date  . Retinal tear repair cryotherapy     right 12/06  . Thumb arthroscopy 1999  . Retinal tear repair cryotherapy     Retinal tear/bleed- Right 12/06  . Capsulotomy 08/2007    bilaterally  . Squamous cell carcinoma excision 07/2008    left forearm  . Meniscus repair 6/11    left knee---Dr Blackmon     Family History  Problem Relation Age of Onset  . Hypertension Father   . Coronary artery disease Neg Hx   . Diabetes Neg Hx   . Cancer  Neg Hx     prostate or colon     History   Social History  . Marital Status: Married    Spouse Name: N/A    Number of Children: N/A  . Years of Education: N/A   Occupational History  . retired Set designer     Social History Main Topics  . Smoking status: Never Smoker   . Smokeless tobacco: Never Used  . Alcohol Use: Yes     Comment: occ.  . Drug Use: Not on file  . Sexually Active: Not on file   Other Topics Concern  . Not on file   Social History Narrative  . No narrative on file     BP 113/71  Pulse 65  Ht 5\' 8"  (1.727 m)  Wt 183 lb (83.008 kg)  BMI 27.82 kg/m2  Physical Exam:  Well appearing middle-aged man, NAD HEENT: Unremarkable Neck:  7 cm JVD, no thyromegally Lungs:  Clear with no wheezes, rales, or rhonchi. HEART:  IRegular rate rhythm, no murmurs, no rubs, no clicks Abd:  soft, positive bowel sounds, no organomegally, no rebound, no guarding Ext:  2 plus pulses, no edema, no cyanosis, no clubbing Skin:  No rashes  no nodules Neuro:  CN II through XII intact, motor grossly intact  EKG Atrial fibrillation with controlled ventricular response.  Assess/Plan:

## 2012-04-10 ENCOUNTER — Other Ambulatory Visit: Payer: Self-pay | Admitting: Emergency Medicine

## 2012-04-10 DIAGNOSIS — I4891 Unspecified atrial fibrillation: Secondary | ICD-10-CM

## 2012-04-10 MED ORDER — DILTIAZEM HCL ER COATED BEADS 180 MG PO CP24: 180.0000 mg | ORAL_CAPSULE | Freq: Every day | ORAL | Status: DC

## 2012-04-13 ENCOUNTER — Other Ambulatory Visit: Payer: Self-pay | Admitting: Cardiology

## 2012-04-13 DIAGNOSIS — I4891 Unspecified atrial fibrillation: Secondary | ICD-10-CM

## 2012-04-13 MED ORDER — DILTIAZEM HCL ER COATED BEADS 180 MG PO CP24: 180.0000 mg | ORAL_CAPSULE | Freq: Every day | ORAL | Status: DC

## 2012-06-16 ENCOUNTER — Telehealth: Payer: Self-pay | Admitting: Internal Medicine

## 2012-06-16 NOTE — Telephone Encounter (Signed)
Diltiazem refill needed at [facility] [phone] or fax [phone]

## 2012-06-20 ENCOUNTER — Other Ambulatory Visit: Payer: Self-pay | Admitting: *Deleted

## 2012-06-20 DIAGNOSIS — I4891 Unspecified atrial fibrillation: Secondary | ICD-10-CM

## 2012-06-20 MED ORDER — DILTIAZEM HCL ER COATED BEADS 180 MG PO CP24: 180.0000 mg | ORAL_CAPSULE | Freq: Every day | ORAL | Status: DC

## 2012-08-03 HISTORY — PX: NASAL SINUS SURGERY: SHX719

## 2012-08-07 ENCOUNTER — Encounter: Payer: Self-pay | Admitting: Internal Medicine

## 2012-08-07 ENCOUNTER — Ambulatory Visit (INDEPENDENT_AMBULATORY_CARE_PROVIDER_SITE_OTHER): Payer: 59 | Admitting: Internal Medicine

## 2012-08-07 VITALS — BP 120/70 | HR 81 | Temp 98.4°F | Wt 182.0 lb

## 2012-08-07 DIAGNOSIS — J329 Chronic sinusitis, unspecified: Secondary | ICD-10-CM

## 2012-08-07 MED ORDER — CLINDAMYCIN HCL 300 MG PO CAPS: 300.0000 mg | ORAL_CAPSULE | Freq: Three times a day (TID) | ORAL | Status: DC

## 2012-08-07 NOTE — Progress Notes (Signed)
Subjective:    Patient ID: Rodney Gray, male    DOB: 30-Jan-1951, 62 y.o.   MRN: [id number]  HPI Had a bad sinus infection in March---was up in [place] Started with constant cough Sore lymph gland in neck Severe pain frontal and maxillary Seen in urgent care and given doxycyline Recommended zyrtec and he decided against this Has been using the flonase Did try some loratadine and it may have helped  Some yellow or green drainage if he bends over No fever but he feels achy Some sore throat from the cough Pressure in ears Started 3 days ago but has been worsening Wants ENT evaluation due to severity of infections  Current Outpatient Prescriptions on File Prior to Visit  Medication Sig Dispense Refill  . aspirin EC 81 MG tablet Take 1 tablet (81 mg total) by mouth daily.  90 tablet  3  . clindamycin (CLEOCIN) 150 MG capsule [facility] to [facility]      . diltiazem (CARDIZEM CD) 180 MG 24 hr capsule Take 1 capsule (180 mg total) by mouth daily.  90 capsule  3  . fish oil-omega-3 fatty acids 1000 MG capsule Take 1 g by mouth daily.       . Glucosamine-Chondroitin 750-600 MG TABS Take 1 tablet by mouth daily.      . Multiple Vitamins-Minerals (MENS MULTIVITAMIN PLUS PO) Take by mouth daily.         No current facility-administered medications on file prior to visit.    Allergies  Allergen Reactions  . Ampicillin     REACTION: hives Has tolerated cephalosporins    Past Medical History  Diagnosis Date  . Hypertension   . Osteoarthritis   . Hyperlipemia   . Atrial fibrillation     A-Fib, 1987, 2003, 2005, EPS RX 2005 (A-Fib reocurred)  . Cataract   . Allergic rhinitis due to pollen     Past Surgical History  Procedure Laterality Date  . Retinal tear repair cryotherapy      right 12/06  . Thumb arthroscopy  1999  . Retinal tear repair cryotherapy      Retinal tear/bleed- Right 12/06  . Capsulotomy  08/2007    bilaterally  . Squamous cell carcinoma excision   07/2008    left forearm  . Meniscus repair  6/11    left knee---Dr Blackmon    Family History  Problem Relation Age of Onset  . Hypertension Father   . Coronary artery disease Neg Hx   . Diabetes Neg Hx   . Cancer Neg Hx     prostate or colon    History   Social History  . Marital Status: Married    Spouse Name: N/A    Number of Children: N/A  . Years of Education: N/A   Occupational History  . retired Set designer     Social History Main Topics  . Smoking status: Never Smoker   . Smokeless tobacco: Never Used  . Alcohol Use: Yes     Comment: occ.  . Drug Use: Not on file  . Sexually Active: Not on file   Other Topics Concern  . Not on file   Social History Narrative  . No narrative on file   Review of Systems Granddaughter had group A strep skin infection recently No rash No vomiting and diarrhea Appetite is okay    Objective:   Physical Exam  Constitutional: He appears well-developed and well-nourished. No distress.  HENT:  Mouth/Throat: Oropharynx is clear  and moist. No oropharyngeal exudate.  Mild left frontal tenderness Moderate nasal inflammation Purulent drainage--actually on right Septum deviated to right? Right TM normal, left TM mostly obscured with cerumen (what little is visible-- was normal)  Neck: Normal range of motion. Neck supple. No thyromegaly present.  Pulmonary/Chest: Effort normal and breath sounds normal. No respiratory distress. He has no wheezes. He has no rales.  Lymphadenopathy:    He has no cervical adenopathy.          Assessment & Plan:

## 2012-08-07 NOTE — Assessment & Plan Note (Signed)
He is really concerned about underlying sinus pathology Will give clinda this time---has anaerobic activity ENT evaluation

## 2012-08-16 ENCOUNTER — Encounter: Payer: Self-pay | Admitting: Internal Medicine

## 2012-08-16 ENCOUNTER — Ambulatory Visit (INDEPENDENT_AMBULATORY_CARE_PROVIDER_SITE_OTHER): Payer: 59 | Admitting: Internal Medicine

## 2012-08-16 VITALS — BP 110/60 | HR 78 | Temp 98.0°F | Wt 180.0 lb

## 2012-08-16 DIAGNOSIS — N400 Enlarged prostate without lower urinary tract symptoms: Secondary | ICD-10-CM

## 2012-08-16 DIAGNOSIS — E785 Hyperlipidemia, unspecified: Secondary | ICD-10-CM

## 2012-08-16 DIAGNOSIS — I4891 Unspecified atrial fibrillation: Secondary | ICD-10-CM

## 2012-08-16 DIAGNOSIS — N401 Enlarged prostate with lower urinary tract symptoms: Secondary | ICD-10-CM | POA: Insufficient documentation

## 2012-08-16 DIAGNOSIS — Z0000 Encounter for general adult medical examination without abnormal findings: Secondary | ICD-10-CM

## 2012-08-16 LAB — LIPID PANEL
Cholesterol: 152 mg/dL (ref 0–200)
VLDL: 17.8 mg/dL (ref 0.0–40.0)

## 2012-08-16 LAB — BASIC METABOLIC PANEL
BUN: 16 mg/dL (ref 6–23)
CO2: 28 mEq/L (ref 19–32)
Chloride: 106 mEq/L (ref 96–112)
GFR: 87.48 mL/min (ref >60.00)
Glucose, Bld: 81 mg/dL (ref 70–99)
Potassium: 4.4 mEq/L (ref 3.5–5.1)

## 2012-08-16 LAB — CBC WITH DIFFERENTIAL/PLATELET
Basophils Absolute: 0 10*3/uL (ref 0.0–0.1)
HCT: 43 % (ref 39.0–52.0)
Lymphs Abs: 1.5 10*3/uL (ref 0.7–4.0)
MCHC: 34.2 g/dL (ref 30.0–36.0)
MCV: 92.2 fl (ref 78.0–100.0)
Monocytes Absolute: 0.5 10*3/uL (ref 0.1–1.0)
Neutro Abs: 3.5 10*3/uL (ref 1.4–7.7)
Platelets: 230 10*3/uL (ref 150.0–400.0)
RDW: 13.4 % (ref 11.5–14.6)

## 2012-08-16 LAB — HEPATIC FUNCTION PANEL
ALT: 16 U/L (ref 0–53)
AST: 17 U/L (ref 0–37)
Albumin: 3.8 g/dL (ref 3.5–5.2)

## 2012-08-16 LAB — TSH: TSH: 2.19 u[IU]/mL (ref 0.35–5.50)

## 2012-08-16 MED ORDER — FLUTICASONE PROPIONATE 50 MCG/ACT NA SUSP: 2.0000 | Freq: Every day | NASAL | Status: DC

## 2012-08-16 NOTE — Assessment & Plan Note (Signed)
Lab Results  Component Value Date   LDLCALC 102* 08/19/2010   Fairly good without meds Will just recheck

## 2012-08-16 NOTE — Assessment & Plan Note (Signed)
Generally healthy Getting sinus surgery next week UTD with imms, colon Defer PSA this year---normal last year

## 2012-08-16 NOTE — Assessment & Plan Note (Signed)
Good rate control ASA only due to low risk

## 2012-08-16 NOTE — Assessment & Plan Note (Signed)
Mild symptoms If he worsens, would try tamsulosin

## 2012-08-16 NOTE — Progress Notes (Signed)
Subjective:    Patient ID: Rodney Gray, male    DOB: 04-15-1950, 62 y.o.   MRN: [id number]  HPI Here for physical  Started with intestinal cramping and diarrhea after 1 week of clinda Felt achy all over also and then fever Saw Dr Andee Poles yesterday afternoon---getting sinus surgery next week  They will check with the cardiologist about clearance  Tick bite on upper right leg by buttock Not engorged No rash, fever, etc  No heart problems  Current Outpatient Prescriptions on File Prior to Visit  Medication Sig Dispense Refill  . aspirin EC 81 MG tablet Take 1 tablet (81 mg total) by mouth daily.  90 tablet  3  . clindamycin (CLEOCIN) 300 MG capsule Take 1 capsule (300 mg total) by mouth 3 (three) times daily.  30 capsule  0  . diltiazem (CARDIZEM CD) 180 MG 24 hr capsule Take 1 capsule (180 mg total) by mouth daily.  90 capsule  3  . Glucosamine-Chondroitin 750-600 MG TABS Take 1 tablet by mouth daily.      . Multiple Vitamins-Minerals (MENS MULTIVITAMIN PLUS PO) Take by mouth daily.         No current facility-administered medications on file prior to visit.    Allergies  Allergen Reactions  . Ampicillin     REACTION: hives Has tolerated cephalosporins    Past Medical History  Diagnosis Date  . Hypertension   . Osteoarthritis   . Hyperlipemia   . Atrial fibrillation     A-Fib, 1987, 2003, 2005, EPS RX 2005 (A-Fib reocurred)  . Cataract   . Allergic rhinitis due to pollen     Past Surgical History  Procedure Laterality Date  . Retinal tear repair cryotherapy      right 12/06  . Thumb arthroscopy  1999  . Retinal tear repair cryotherapy      Retinal tear/bleed- Right 12/06  . Capsulotomy  08/2007    bilaterally  . Squamous cell carcinoma excision  07/2008    left forearm  . Meniscus repair  6/11    left knee---Dr Blackmon    Family History  Problem Relation Age of Onset  . Hypertension Father   . Coronary artery disease Neg Hx   . Diabetes Neg Hx    . Cancer Neg Hx     prostate or colon    History   Social History  . Marital Status: Married    Spouse Name: N/A    Number of Children: N/A  . Years of Education: N/A   Occupational History  . retired Set designer     Social History Main Topics  . Smoking status: Never Smoker   . Smokeless tobacco: Never Used  . Alcohol Use: Yes     Comment: occ.  . Drug Use: Not on file  . Sexually Active: Not on file   Other Topics Concern  . Not on file   Social History Narrative  . No narrative on file   Review of Systems  Constitutional: Negative for fatigue and unexpected weight change.       Usually exercises if not sick Wears seat belt  HENT: Positive for congestion, rhinorrhea, sinus pressure and tinnitus. Negative for hearing loss and dental problem.        Regular with dentist  Eyes: Negative for visual disturbance.       No diplopia or unilateral vision loss  Respiratory: Positive for cough. Negative for chest tightness and shortness of breath.  Cough with sinus infection  Cardiovascular: Negative for chest pain, palpitations and leg swelling.  Gastrointestinal: Negative for nausea and vomiting.       Rare heartburn--tums help Bowels fine except for diarrhea with clinda  Endocrine: Positive for cold intolerance and heat intolerance.  Genitourinary: Positive for urgency and frequency. Negative for difficulty urinating.       Nocturia is variable-- 0-2 No sexual problems  Musculoskeletal: Positive for back pain and arthralgias. Negative for joint swelling.  Skin: Negative for rash.       No suspicious lesions  Allergic/Immunologic: Positive for environmental allergies. Negative for immunocompromised state.       On flonase  Neurological: Negative for dizziness, syncope, weakness, light-headedness, numbness and headaches.  Hematological: Positive for adenopathy. Does not bruise/bleed easily.       Glands with sinus infections  Psychiatric/Behavioral:  Positive for sleep disturbance. Negative for dysphoric mood. The patient is not nervous/anxious.        Has to battle with sleep--neck and back issues       Objective:   Physical Exam  Constitutional: He is oriented to person, place, and time. He appears well-developed and well-nourished. No distress.  HENT:  Head: Normocephalic and atraumatic.  Right Ear: External ear normal.  Left Ear: External ear normal.  Mouth/Throat: Oropharynx is clear and moist. No oropharyngeal exudate.  Eyes: Conjunctivae and EOM are normal. Pupils are equal, round, and reactive to light.  Neck: Normal range of motion. Neck supple. No thyromegaly present.  Cardiovascular: Normal rate, normal heart sounds and intact distal pulses.  Exam reveals no gallop.   No murmur heard. irregular  Pulmonary/Chest: Effort normal and breath sounds normal. No respiratory distress. He has no wheezes. He has no rales.  Abdominal: Soft. There is no tenderness.  Musculoskeletal: He exhibits no edema and no tenderness.  Lymphadenopathy:    He has no cervical adenopathy.  Neurological: He is alert and oriented to person, place, and time.  Skin: No rash noted. No erythema.  Psychiatric: He has a normal mood and affect. His behavior is normal.          Assessment & Plan:

## 2012-08-17 ENCOUNTER — Telehealth: Payer: Self-pay | Admitting: Internal Medicine

## 2012-08-17 ENCOUNTER — Telehealth: Payer: Self-pay

## 2012-08-17 NOTE — Telephone Encounter (Signed)
Almira Coaster with [facility] left v/m requesting lab results from 08/16/12; pt is having surgery next week. Gina request lab results faxed to [phone]; their system does not accept electronically generated faxes and request manual fax.

## 2012-08-17 NOTE — Telephone Encounter (Signed)
faxed

## 2012-08-17 NOTE — Telephone Encounter (Signed)
Follow Up      Calling in following up on surgical clearance fax sent over on 5/13. Please call.

## 2012-08-18 ENCOUNTER — Encounter: Payer: Self-pay | Admitting: *Deleted

## 2012-08-18 NOTE — Telephone Encounter (Signed)
Labs faxed as requested

## 2012-08-21 ENCOUNTER — Telehealth: Payer: Self-pay | Admitting: Internal Medicine

## 2012-08-21 NOTE — Telephone Encounter (Signed)
Faxed surgical clearance to [facility] on 5.15.14 for Dr. Ladona Ridgel [phone]

## 2012-08-23 ENCOUNTER — Encounter: Payer: 59 | Admitting: Internal Medicine

## 2012-08-23 ENCOUNTER — Ambulatory Visit: Payer: Self-pay | Admitting: Otolaryngology

## 2012-12-27 ENCOUNTER — Ambulatory Visit (INDEPENDENT_AMBULATORY_CARE_PROVIDER_SITE_OTHER): Payer: 59

## 2012-12-27 DIAGNOSIS — Z23 Encounter for immunization: Secondary | ICD-10-CM

## 2013-03-16 ENCOUNTER — Ambulatory Visit (INDEPENDENT_AMBULATORY_CARE_PROVIDER_SITE_OTHER): Payer: 59 | Admitting: Internal Medicine

## 2013-03-16 ENCOUNTER — Encounter: Payer: Self-pay | Admitting: Internal Medicine

## 2013-03-16 VITALS — BP 110/70 | HR 67 | Temp 98.4°F | Wt 190.0 lb

## 2013-03-16 DIAGNOSIS — R1031 Right lower quadrant pain: Secondary | ICD-10-CM | POA: Insufficient documentation

## 2013-03-16 NOTE — Assessment & Plan Note (Signed)
Pain spot is just above the inguinal ring No hernia Seems like strain that is just not improving Nothing to suggest prostatitis  Reassured that doesn't seem to be something serious

## 2013-03-16 NOTE — Progress Notes (Signed)
Subjective:    Patient ID: Rodney Gray, male    DOB: [birth year], 62 y.o.   MRN: [id number]  HPI Having pain in right groin Goes back 2 months or so and was hoping it would just go away Noticed it hurting just in car yesterday Often feels it just a rest---like in bed Just notices it at times--frankly painful at other times  Doesn't remember any injury No obvious bulge Urinates frequenty--especially after coming out of the cold Nocturia x 1 usually No dysuria or hematuria Does notice a "pull" in the groin in the same spot--with ejaculation  Feels something by his anus No pain No bleeding  Current Outpatient Prescriptions on File Prior to Visit  Medication Sig Dispense Refill  . aspirin EC 81 MG tablet Take 1 tablet (81 mg total) by mouth daily.  90 tablet  3  . diltiazem (CARDIZEM CD) 180 MG 24 hr capsule Take 1 capsule (180 mg total) by mouth daily.  90 capsule  3  . fluticasone (FLONASE) 50 MCG/ACT nasal spray Place 2 sprays into the nose daily. In each nostril  16 g  6  . Glucosamine-Chondroitin 750-600 MG TABS Take 1 tablet by mouth daily.      . Multiple Vitamins-Minerals (MENS MULTIVITAMIN PLUS PO) Take by mouth daily.        . Omega-3 Fatty Acids (FISH OIL) 1200 MG CAPS Take 1 capsule by mouth daily.       No current facility-administered medications on file prior to visit.    Allergies  Allergen Reactions  . Ampicillin     REACTION: hives Has tolerated cephalosporins    Past Medical History  Diagnosis Date  . Hypertension   . Osteoarthritis   . Hyperlipemia   . Atrial fibrillation     A-Fib, 1987, 2003, 2005, EPS RX 2005 (A-Fib reocurred)  . Cataract   . Allergic rhinitis due to pollen   . BPH (benign prostatic hypertrophy)     Past Surgical History  Procedure Laterality Date  . Retinal tear repair cryotherapy      right 12/06  . Thumb arthroscopy  1999  . Retinal tear repair cryotherapy      Retinal tear/bleed- Right 12/06  . Capsulotomy   08/2007    bilaterally  . Squamous cell carcinoma excision  07/2008    left forearm  . Meniscus repair  6/11    left knee---Dr Blackmon    Family History  Problem Relation Age of Onset  . Hypertension Father   . Coronary artery disease Neg Hx   . Diabetes Neg Hx   . Cancer Neg Hx     prostate or colon    History   Social History  . Marital Status: Married    Spouse Name: N/A    Number of Children: N/A  . Years of Education: N/A   Occupational History  . retired Set designer     Social History Main Topics  . Smoking status: Never Smoker   . Smokeless tobacco: Never Used  . Alcohol Use: Yes     Comment: occ.  . Drug Use: Not on file  . Sexual Activity: Not on file   Other Topics Concern  . Not on file   Social History Narrative  . No narrative on file   Review of Systems Bowels are fine Appetite is okay Gained 10# since last visit    Objective:   Physical Exam  Constitutional: He appears well-developed and well-nourished. No distress.  Abdominal:  Soft. Bowel sounds are normal. He exhibits no distension and no mass. There is no tenderness. There is no rebound and no guarding.  Genitourinary:  No hernia Scrotum is quiet Thickened perirectal tissue without mass Prostate is firm and non tender          Assessment & Plan:

## 2013-03-16 NOTE — Progress Notes (Signed)
Pre-visit discussion using our clinic review tool. No additional management support is needed unless otherwise documented below in the visit note.  

## 2013-03-20 ENCOUNTER — Ambulatory Visit (INDEPENDENT_AMBULATORY_CARE_PROVIDER_SITE_OTHER): Payer: 59 | Admitting: Internal Medicine

## 2013-03-20 ENCOUNTER — Encounter: Payer: Self-pay | Admitting: Internal Medicine

## 2013-03-20 VITALS — BP 114/60 | HR 72 | Ht 68.0 in | Wt 190.4 lb

## 2013-03-20 DIAGNOSIS — E785 Hyperlipidemia, unspecified: Secondary | ICD-10-CM

## 2013-03-20 DIAGNOSIS — I4891 Unspecified atrial fibrillation: Secondary | ICD-10-CM

## 2013-03-20 NOTE — Assessment & Plan Note (Signed)
He is encouraged to lose weight and exercise and maintain a low fat diet.

## 2013-03-20 NOTE — Progress Notes (Signed)
HPI [name] returns today for followup. He is a very pleasant 62 year old man with chronic atrial fibrillation. He has done well in the interim. His blood pressure is well controlled on low-dose calcium channel blockers. He denies palpitations. No syncope, cough, chest pain, or shortness of breath. No peripheral edema. Allergies  Allergen Reactions  . Ampicillin     REACTION: hives Has tolerated cephalosporins     Current Outpatient Prescriptions  Medication Sig Dispense Refill  . aspirin EC 81 MG tablet Take 1 tablet (81 mg total) by mouth daily.  90 tablet  3  . diltiazem (CARDIZEM CD) 180 MG 24 hr capsule Take 1 capsule (180 mg total) by mouth daily.  90 capsule  3  . fluticasone (FLONASE) 50 MCG/ACT nasal spray Place 2 sprays into the nose daily. In each nostril  16 g  6  . Glucosamine-Chondroitin 750-600 MG TABS Take 1 tablet by mouth daily.      . Multiple Vitamins-Minerals (MENS MULTIVITAMIN PLUS PO) Take 1 capsule by mouth daily.       . Omega-3 Fatty Acids (FISH OIL) 1200 MG CAPS Take 1 capsule by mouth daily.       No current facility-administered medications for this visit.     Past Medical History  Diagnosis Date  . Hypertension   . Osteoarthritis   . Hyperlipemia   . Atrial fibrillation     A-Fib, 1987, 2003, 2005, EPS RX 2005 (A-Fib reocurred)  . Cataract   . Allergic rhinitis due to pollen   . BPH (benign prostatic hypertrophy)     ROS:   All systems reviewed and negative except as noted in the HPI.   Past Surgical History  Procedure Laterality Date  . Retinal tear repair cryotherapy      right 12/06  . Thumb arthroscopy  1999  . Retinal tear repair cryotherapy      Retinal tear/bleed- Right 12/06  . Capsulotomy  08/2007    bilaterally  . Squamous cell carcinoma excision  07/2008    left forearm  . Meniscus repair  6/11    left knee---Dr Blackmon     Family History  Problem Relation Age of Onset  . Hypertension Father   . Coronary  artery disease Neg Hx   . Diabetes Neg Hx   . Cancer Neg Hx     prostate or colon     History   Social History  . Marital Status: Married    Spouse Name: N/A    Number of Children: N/A  . Years of Education: N/A   Occupational History  . retired Set designer     Social History Main Topics  . Smoking status: Never Smoker   . Smokeless tobacco: Never Used  . Alcohol Use: Yes     Comment: occ.  . Drug Use: Not on file  . Sexual Activity: Not on file   Other Topics Concern  . Not on file   Social History Narrative  . No narrative on file     BP 114/60  Pulse 72  Ht 5\' 8"  (1.727 m)  Wt 190 lb 6.4 oz (86.365 kg)  BMI 28.96 kg/m2  Physical Exam:  Well appearing middle-aged man, NAD HEENT: Unremarkable Neck:  7 cm JVD, no thyromegally Lungs:  Clear with no wheezes, rales, or rhonchi. HEART:  IRegular rate rhythm, no murmurs, no rubs, no clicks Abd:  soft, positive bowel sounds, no organomegally, no rebound, no guarding Ext:  2 plus pulses, no  edema, no cyanosis, no clubbing Skin:  No rashes no nodules Neuro:  CN II through XII intact, motor grossly intact  EKG Atrial fibrillation with controlled ventricular response.  Assess/Plan:

## 2013-03-20 NOTE — Patient Instructions (Signed)
Your physician wants you to follow-up in: 12 months with Dr. Taylor. You will receive a reminder letter in the mail two months in advance. If you don't receive a letter, please call our office to schedule the follow-up appointment.    

## 2013-03-20 NOTE — Assessment & Plan Note (Signed)
He remains in chronic atrial fib with a controlled ventricular rate. He exercises regularly. He is still low risk for stroke. Will consider anti-coagulation when he turns 65.

## 2013-07-02 ENCOUNTER — Other Ambulatory Visit: Payer: Self-pay

## 2013-07-02 DIAGNOSIS — I4891 Unspecified atrial fibrillation: Secondary | ICD-10-CM

## 2013-07-02 MED ORDER — DILTIAZEM HCL ER COATED BEADS 180 MG PO CP24: 180.0000 mg | ORAL_CAPSULE | Freq: Every day | ORAL | Status: DC

## 2013-08-23 ENCOUNTER — Encounter: Payer: Self-pay | Admitting: Internal Medicine

## 2013-08-23 ENCOUNTER — Ambulatory Visit (INDEPENDENT_AMBULATORY_CARE_PROVIDER_SITE_OTHER): Payer: 59 | Admitting: Internal Medicine

## 2013-08-23 VITALS — BP 118/64 | HR 69 | Temp 97.7°F | Ht 67.75 in | Wt 187.0 lb

## 2013-08-23 DIAGNOSIS — M1990 Unspecified osteoarthritis, unspecified site: Secondary | ICD-10-CM

## 2013-08-23 DIAGNOSIS — Z0000 Encounter for general adult medical examination without abnormal findings: Secondary | ICD-10-CM

## 2013-08-23 DIAGNOSIS — I4891 Unspecified atrial fibrillation: Secondary | ICD-10-CM

## 2013-08-23 DIAGNOSIS — E785 Hyperlipidemia, unspecified: Secondary | ICD-10-CM

## 2013-08-23 DIAGNOSIS — Z125 Encounter for screening for malignant neoplasm of prostate: Secondary | ICD-10-CM

## 2013-08-23 NOTE — Assessment & Plan Note (Signed)
Fairly healthy Will check PSA after discussion

## 2013-08-23 NOTE — Progress Notes (Signed)
Pre visit review using our clinic review tool, if applicable. No additional management support is needed unless otherwise documented below in the visit note. 

## 2013-08-23 NOTE — Assessment & Plan Note (Signed)
OTC meds only

## 2013-08-23 NOTE — Assessment & Plan Note (Signed)
In hips Suggested more walking and leg strengthening---avoid the squats

## 2013-08-23 NOTE — Progress Notes (Signed)
Subjective:    Patient ID: Rodney Gray, male    DOB: [birth year], 63 y.o.   MRN: [id number]  HPI Here for physical  Having bilateral hip pain Hard to sit for long periods--has to get up after 2 hours in the car Stiff after sitting--loosens up after a little bit Exercises every other day--- but does squats and jumping jacks Discussed walking and leg strengthening --hold off on the squats  No heart symptoms Voids okay  Current Outpatient Prescriptions on File Prior to Visit  Medication Sig Dispense Refill  . aspirin EC 81 MG tablet Take 1 tablet (81 mg total) by mouth daily.  90 tablet  3  . diltiazem (CARDIZEM CD) 180 MG 24 hr capsule Take 1 capsule (180 mg total) by mouth daily.  90 capsule  3  . fluticasone (FLONASE) 50 MCG/ACT nasal spray Place 2 sprays into the nose daily. In each nostril  16 g  6  . Glucosamine-Chondroitin 750-600 MG TABS Take 1 tablet by mouth daily.      . Multiple Vitamins-Minerals (MENS MULTIVITAMIN PLUS PO) Take 1 capsule by mouth daily.       . Omega-3 Fatty Acids (FISH OIL) 1200 MG CAPS Take 1 capsule by mouth daily.       No current facility-administered medications on file prior to visit.    Allergies  Allergen Reactions  . Ampicillin     REACTION: hives Has tolerated cephalosporins    Past Medical History  Diagnosis Date  . Hypertension   . Osteoarthritis   . Hyperlipemia   . Atrial fibrillation     A-Fib, 1987, 2003, 2005, EPS RX 2005 (A-Fib reocurred)  . Cataract   . Allergic rhinitis due to pollen   . BPH (benign prostatic hypertrophy)     Past Surgical History  Procedure Laterality Date  . Retinal tear repair cryotherapy      right 12/06  . Thumb arthroscopy  1999  . Retinal tear repair cryotherapy      Retinal tear/bleed- Right 12/06  . Capsulotomy  08/2007    bilaterally  . Squamous cell carcinoma excision  07/2008    left forearm  . Meniscus repair  6/11    left knee---Dr Blackmon    Family History  Problem  Relation Age of Onset  . Hypertension Father   . Coronary artery disease Neg Hx   . Diabetes Neg Hx   . Cancer Neg Hx     prostate or colon    History   Social History  . Marital Status: Married    Spouse Name: N/A    Number of Children: N/A  . Years of Education: N/A   Occupational History  . retired Ship broker     Social History Main Topics  . Smoking status: Never Smoker   . Smokeless tobacco: Never Used  . Alcohol Use: Yes     Comment: occ.  . Drug Use: Not on file  . Sexual Activity: Not on file   Other Topics Concern  . Not on file   Social History Narrative  . No narrative on file   Review of Systems  Constitutional: Negative for fatigue and unexpected weight change.       Wears seat belt  HENT: Positive for congestion and hearing loss. Negative for tinnitus.        Uses saline rinse more now Regular with dentist  Eyes: Negative for visual disturbance.       No diplopia or unilateral vision  loss  Respiratory: Positive for cough. Negative for chest tightness and shortness of breath.        Cough only when the pollen was really bad  Cardiovascular: Negative for chest pain, palpitations and leg swelling.  Gastrointestinal: Negative for nausea, vomiting, abdominal pain, constipation and blood in stool.       No heartburn  Endocrine: Negative for cold intolerance and heat intolerance.  Genitourinary: Negative for urgency, frequency and difficulty urinating.       Occasional nocturia No sexual problems  Musculoskeletal: Positive for arthralgias. Negative for back pain and joint swelling.  Skin: Negative for rash.       Has spot on left shoulder to be checked---will go to derm  Allergic/Immunologic: Positive for environmental allergies. Negative for immunocompromised state.       Uses OTC allergy med prn Uses the flonase only once a week or so  Neurological: Negative for dizziness, syncope, weakness, light-headedness, numbness and headaches.    Hematological: Negative for adenopathy. Does not bruise/bleed easily.  Psychiatric/Behavioral: Positive for sleep disturbance. Negative for dysphoric mood. The patient is not nervous/anxious.        Sleeping better with tylenol arthritis       Objective:   Physical Exam  Constitutional: He is oriented to person, place, and time. He appears well-developed and well-nourished. No distress.  HENT:  Head: Normocephalic and atraumatic.  Right Ear: External ear normal.  Left Ear: External ear normal.  Mouth/Throat: Oropharynx is clear and moist. No oropharyngeal exudate.  Eyes: Conjunctivae and EOM are normal. Pupils are equal, round, and reactive to light.  Neck: Normal range of motion. Neck supple. No thyromegaly present.  Cardiovascular: Normal rate, normal heart sounds and intact distal pulses.  Exam reveals no gallop.   No murmur heard. irregular  Pulmonary/Chest: Effort normal and breath sounds normal. No respiratory distress. He has no wheezes. He has no rales.  Abdominal: Soft. He exhibits no distension. There is no tenderness. There is no rebound and no guarding.  Musculoskeletal: He exhibits no edema and no tenderness.  Mild decrease in internal rotation on right, moderate on left  Lymphadenopathy:    He has no cervical adenopathy.  Neurological: He is alert and oriented to person, place, and time.  Skin: No rash noted. No erythema.  Several dry spots--slight scaling  Psychiatric: He has a normal mood and affect. His behavior is normal.          Assessment & Plan:

## 2013-08-23 NOTE — Assessment & Plan Note (Signed)
Good rate control Low risk--no anticoagulation yet

## 2013-08-24 LAB — COMPREHENSIVE METABOLIC PANEL
ALBUMIN: 3.9 g/dL (ref 3.5–5.2)
ALT: 16 U/L (ref 0–53)
AST: 20 U/L (ref 0–37)
Alkaline Phosphatase: 57 U/L (ref 39–117)
BUN: 18 mg/dL (ref 6–23)
CALCIUM: 9.6 mg/dL (ref 8.4–10.5)
CHLORIDE: 102 meq/L (ref 96–112)
CO2: 30 meq/L (ref 19–32)
Creatinine, Ser: 0.8 mg/dL (ref 0.4–1.5)
GFR: 98.06 mL/min (ref >60.00)
GLUCOSE: 110 mg/dL — AB (ref 70–99)
POTASSIUM: 4.2 meq/L (ref 3.5–5.1)
SODIUM: 138 meq/L (ref 135–145)
TOTAL PROTEIN: 6.4 g/dL (ref 6.0–8.3)
Total Bilirubin: 0.5 mg/dL (ref 0.2–1.2)

## 2013-08-24 LAB — LIPID PANEL
CHOL/HDL RATIO: 5
Cholesterol: 192 mg/dL (ref 0–200)
HDL: 39.7 mg/dL (ref >39.00)
LDL CALC: 111 mg/dL — AB (ref 0–99)
Triglycerides: 208 mg/dL — ABNORMAL HIGH (ref 0.0–149.0)
VLDL: 41.6 mg/dL — AB (ref 0.0–40.0)

## 2013-08-24 LAB — CBC WITH DIFFERENTIAL/PLATELET
BASOS PCT: 0.2 % (ref 0.0–3.0)
Basophils Absolute: 0 10*3/uL (ref 0.0–0.1)
EOS PCT: 1.6 % (ref 0.0–5.0)
Eosinophils Absolute: 0.1 10*3/uL (ref 0.0–0.7)
HCT: 42.4 % (ref 39.0–52.0)
Hemoglobin: 14.1 g/dL (ref 13.0–17.0)
LYMPHS PCT: 25.2 % (ref 12.0–46.0)
Lymphs Abs: 1.5 10*3/uL (ref 0.7–4.0)
MCHC: 33.3 g/dL (ref 30.0–36.0)
MCV: 94.1 fl (ref 78.0–100.0)
MONO ABS: 0.5 10*3/uL (ref 0.1–1.0)
MONOS PCT: 7.6 % (ref 3.0–12.0)
NEUTROS PCT: 65.4 % (ref 43.0–77.0)
Neutro Abs: 3.9 10*3/uL (ref 1.4–7.7)
PLATELETS: 217 10*3/uL (ref 150.0–400.0)
RBC: 4.51 Mil/uL (ref 4.22–5.81)
RDW: 13.8 % (ref 11.5–15.5)
WBC: 6 10*3/uL (ref 4.0–10.5)

## 2013-08-24 LAB — TSH: TSH: 1.51 u[IU]/mL (ref 0.35–4.50)

## 2013-08-24 LAB — PSA: PSA: 1 ng/mL (ref 0.10–4.00)

## 2013-08-24 LAB — T4, FREE: FREE T4: 0.93 ng/dL (ref 0.60–1.60)

## 2013-12-19 ENCOUNTER — Ambulatory Visit: Payer: 59

## 2013-12-26 ENCOUNTER — Ambulatory Visit: Payer: 59

## 2014-03-19 ENCOUNTER — Encounter: Payer: Self-pay | Admitting: Internal Medicine

## 2014-03-19 ENCOUNTER — Ambulatory Visit (INDEPENDENT_AMBULATORY_CARE_PROVIDER_SITE_OTHER): Payer: 59 | Admitting: Internal Medicine

## 2014-03-19 VITALS — BP 120/70 | HR 77 | Temp 98.3°F | Wt 188.0 lb

## 2014-03-19 DIAGNOSIS — J0110 Acute frontal sinusitis, unspecified: Secondary | ICD-10-CM | POA: Insufficient documentation

## 2014-03-19 MED ORDER — AZITHROMYCIN 250 MG PO TABS: ORAL_TABLET | ORAL | Status: DC

## 2014-03-19 MED ORDER — HYDROCODONE-HOMATROPINE 5-1.5 MG/5ML PO SYRP: 5.0000 mL | ORAL_SOLUTION | Freq: Every evening | ORAL | Status: DC | PRN

## 2014-03-19 NOTE — Progress Notes (Signed)
Pre visit review using our clinic review tool, if applicable. No additional management support is needed unless otherwise documented below in the visit note. 

## 2014-03-19 NOTE — Assessment & Plan Note (Signed)
Hard to tell if still just viral or a secondary bacterial infection Does have some purulent mucus Discussed supportive care--- cough syrup Azithromycin if worsens

## 2014-03-19 NOTE — Patient Instructions (Signed)
Please start the azithromycin if you are worsening over the next couple of days.

## 2014-03-19 NOTE — Progress Notes (Signed)
Subjective:    Patient ID: Rodney Gray, male    DOB: 31-Aug-1950, 63 y.o.   MRN: [id number]  HPI Here for evaluation of respiratory symptoms  [place] the beach last [place]--- awoke with sore throat 7 days ago Some congestion Then after a couple of days--started with cough---productive of large amount of green sputum Green drainage from nose also Some sinus pain in left maxilla and frontal areas  Got chills a few days ago at night--?fever No sweats No SOB No ear pain  Tried expectorant and cough med  Current Outpatient Prescriptions on File Prior to Visit  Medication Sig Dispense Refill  . aspirin EC 81 MG tablet Take 1 tablet (81 mg total) by mouth daily. 90 tablet 3  . diltiazem (CARDIZEM CD) 180 MG 24 hr capsule Take 1 capsule (180 mg total) by mouth daily. 90 capsule 3  . Multiple Vitamins-Minerals (MENS MULTIVITAMIN PLUS PO) Take 1 capsule by mouth daily.     . Omega-3 Fatty Acids (FISH OIL) 1200 MG CAPS Take 1 capsule by mouth daily.     No current facility-administered medications on file prior to visit.    Allergies  Allergen Reactions  . Ampicillin     REACTION: hives Has tolerated cephalosporins    Past Medical History  Diagnosis Date  . Hypertension   . Osteoarthritis   . Hyperlipemia   . Atrial fibrillation     A-Fib, 1987, 2003, 2005, EPS RX 2005 (A-Fib reocurred)  . Cataract   . Allergic rhinitis due to pollen   . BPH (benign prostatic hypertrophy)     Past Surgical History  Procedure Laterality Date  . Retinal tear repair cryotherapy      right 12/06  . Thumb arthroscopy  1999  . Retinal tear repair cryotherapy      Retinal tear/bleed- Right 12/06  . Capsulotomy  08/2007    bilaterally  . Squamous cell carcinoma excision  07/2008    left forearm  . Meniscus repair  6/11    left knee---Dr Blackmon    Family History  Problem Relation Age of Onset  . Hypertension Father   . Coronary artery disease Neg Hx   . Diabetes Neg Hx   . Cancer  Neg Hx     prostate or colon    History   Social History  . Marital Status: Married    Spouse Name: N/A    Number of Children: N/A  . Years of Education: N/A   Occupational History  . retired Ship broker     Social History Main Topics  . Smoking status: Never Smoker   . Smokeless tobacco: Never Used  . Alcohol Use: Yes     Comment: occ.  . Drug Use: Not on file  . Sexual Activity: Not on file   Other Topics Concern  . Not on file   Social History Narrative   Review of Systems  No rash No vomiting 1 day of loose stools and stomach cramps 2-3 days ago---no recurrence Appetite is okay     Objective:   Physical Exam  Constitutional: He appears well-developed and well-nourished. No distress.  HENT:  Slight left frontal tenderness TMs normal Mild nasal inflammation Slight pharyngeal injection without exudate  Neck: Normal range of motion. Neck supple. No thyromegaly present.  Pulmonary/Chest: Effort normal and breath sounds normal. No respiratory distress. He has no wheezes. He has no rales.  Lymphadenopathy:    He has no cervical adenopathy.  Assessment & Plan:

## 2014-04-16 ENCOUNTER — Ambulatory Visit (INDEPENDENT_AMBULATORY_CARE_PROVIDER_SITE_OTHER): Payer: 59 | Admitting: Internal Medicine

## 2014-04-16 ENCOUNTER — Encounter: Payer: Self-pay | Admitting: Internal Medicine

## 2014-04-16 VITALS — BP 114/62 | HR 62 | Ht 68.0 in | Wt 191.0 lb

## 2014-04-16 DIAGNOSIS — I481 Persistent atrial fibrillation: Secondary | ICD-10-CM

## 2014-04-16 DIAGNOSIS — I482 Chronic atrial fibrillation, unspecified: Secondary | ICD-10-CM

## 2014-04-16 DIAGNOSIS — I4819 Other persistent atrial fibrillation: Secondary | ICD-10-CM

## 2014-04-16 NOTE — Assessment & Plan Note (Signed)
He is well rate controlled. No change in medical therapy. He is low risk for stroke.

## 2014-04-16 NOTE — Progress Notes (Signed)
HPI [name] returns today for followup. He is a very pleasant 64 year old man with chronic atrial fibrillation. He has done well in the interim. His blood pressure is well controlled on low-dose calcium channel blockers. He denies palpitations. No syncope, cough, chest pain, or shortness of breath. No peripheral edema. He is pending a colonoscopy.  Allergies  Allergen Reactions  . Ampicillin     REACTION: hives Has tolerated cephalosporins     Current Outpatient Prescriptions  Medication Sig Dispense Refill  . aspirin EC 81 MG tablet Take 1 tablet (81 mg total) by mouth daily. 90 tablet 3  . diltiazem (CARDIZEM CD) 180 MG 24 hr capsule Take 1 capsule (180 mg total) by mouth daily. 90 capsule 3  . fluticasone (FLONASE) 50 MCG/ACT nasal spray Place 1 spray into both nostrils daily as needed for allergies or rhinitis.     . Multiple Vitamins-Minerals (MENS MULTIVITAMIN PLUS PO) Take 1 capsule by mouth daily.     . Omega-3 Fatty Acids (FISH OIL) 1200 MG CAPS Take 1 capsule by mouth daily.    Marland Kitchen gentamicin ointment (GARAMYCIN) 0.1 % Apple to bilateral nostrils with Q-Tip 3 times a day  6   No current facility-administered medications for this visit.     Past Medical History  Diagnosis Date  . Hypertension   . Osteoarthritis   . Hyperlipemia   . Atrial fibrillation     A-Fib, 1987, 2003, 2005, EPS RX 2005 (A-Fib reocurred)  . Cataract   . Allergic rhinitis due to pollen   . BPH (benign prostatic hypertrophy)     ROS:   All systems reviewed and negative except as noted in the HPI.   Past Surgical History  Procedure Laterality Date  . Retinal tear repair cryotherapy      right 12/06  . Thumb arthroscopy  1999  . Retinal tear repair cryotherapy      Retinal tear/bleed- Right 12/06  . Capsulotomy  08/2007    bilaterally  . Squamous cell carcinoma excision  07/2008    left forearm  . Meniscus repair  6/11    left knee---Dr Blackmon     Family History  Problem  Relation Age of Onset  . Hypertension Father   . Coronary artery disease Neg Hx   . Diabetes Neg Hx   . Cancer Neg Hx     prostate or colon     History   Social History  . Marital Status: Married    Spouse Name: N/A    Number of Children: N/A  . Years of Education: N/A   Occupational History  . retired Ship broker     Social History Main Topics  . Smoking status: Never Smoker   . Smokeless tobacco: Never Used  . Alcohol Use: Yes     Comment: occ.  . Drug Use: Not on file  . Sexual Activity: Not on file   Other Topics Concern  . Not on file   Social History Narrative     BP 114/62 mmHg  Pulse 62  Ht 5\' 8"  (1.727 m)  Wt 191 lb (86.637 kg)  BMI 29.05 kg/m2  Physical Exam:  Well appearing middle-aged man, NAD HEENT: Unremarkable Neck:  7 cm JVD, no thyromegally Lungs:  Clear with no wheezes, rales, or rhonchi. HEART:  IRegular rate rhythm, no murmurs, no rubs, no clicks Abd:  soft, positive bowel sounds, no organomegally, no rebound, no guarding Ext:  2 plus pulses, no edema, no cyanosis, no clubbing  Skin:  No rashes no nodules Neuro:  CN II through XII intact, motor grossly intact  EKG Atrial fibrillation with controlled ventricular response.  Assess/Plan:

## 2014-04-16 NOTE — Patient Instructions (Signed)
Your physician wants you to follow-up in: 1 year with Dr. Lovena Le. You will receive a reminder letter in the mail two months in advance. If you don't receive a letter, please call our office to schedule the follow-up appointment.  Your physician recommends that you continue on your current medications as directed. Please refer to the Current Medication list given to you today.

## 2014-04-16 NOTE — Assessment & Plan Note (Signed)
He is encouraged to maintain a low fat diet and continue fish oil.

## 2014-04-17 ENCOUNTER — Ambulatory Visit (AMBULATORY_SURGERY_CENTER): Payer: Self-pay | Admitting: *Deleted

## 2014-04-17 VITALS — Ht 68.0 in | Wt 191.0 lb

## 2014-04-17 DIAGNOSIS — Z1211 Encounter for screening for malignant neoplasm of colon: Secondary | ICD-10-CM

## 2014-04-17 MED ORDER — MOVIPREP 100 G PO SOLR: ORAL | Status: DC

## 2014-04-17 NOTE — Progress Notes (Signed)
Last colonoscopy was in [place]- MD has retired  No egg or soy allergy  No anesthesia or intubation problems per pt  No diet medications taken  Registered in [facility]

## 2014-04-25 ENCOUNTER — Encounter: Payer: Self-pay | Admitting: Internal Medicine

## 2014-04-26 ENCOUNTER — Encounter: Payer: 59 | Admitting: Internal Medicine

## 2014-05-03 ENCOUNTER — Encounter: Payer: Self-pay | Admitting: Internal Medicine

## 2014-05-03 ENCOUNTER — Encounter: Payer: Self-pay | Admitting: *Deleted

## 2014-05-03 NOTE — Progress Notes (Signed)
Patient ID: Rodney Gray, male   DOB: 06/17/1950, 63 y.o.   MRN: [id number]  pt's procedure was cancelled d/t weather.  He is here with his wife today for her procedure, so he asked if I could give him new instructions.  New instructions reviewed with pt and understanding voiced.  PV for 05-09-14 cancelled

## 2014-05-22 ENCOUNTER — Encounter: Payer: Self-pay | Admitting: Internal Medicine

## 2014-05-22 ENCOUNTER — Ambulatory Visit (AMBULATORY_SURGERY_CENTER): Payer: 59 | Admitting: Internal Medicine

## 2014-05-22 VITALS — BP 104/72 | HR 57 | Temp 97.1°F | Resp 18 | Ht 68.0 in | Wt 191.0 lb

## 2014-05-22 DIAGNOSIS — Z1211 Encounter for screening for malignant neoplasm of colon: Secondary | ICD-10-CM

## 2014-05-22 MED ORDER — SODIUM CHLORIDE 0.9 % IV SOLN: 500.0000 mL | INTRAVENOUS | Status: DC

## 2014-05-22 NOTE — Op Note (Signed)
[facility]  [address] [place], [address]   COLONOSCOPY PROCEDURE REPORT  PATIENT: Rodney, Gray  MR#: [id number] BIRTHDATE: 10-12-1950 , 63  yrs. old GENDER: male ENDOSCOPIST: Lafayette Dragon, MD REFERRED ES:[id number] Silvio Pate, M.D. PROCEDURE DATE:  05/22/2014 PROCEDURE:   Colonoscopy, screening First Screening Colonoscopy - Avg.  risk and is 50 yrs.  old or older - No.  Prior Negative Screening - Now for repeat screening. 10 or more years since last screening  History of Adenoma - Now for follow-up colonoscopy & has been > or = to 3 yrs.  N/A  Polyps Removed Today? No.  Polyps Removed Today? No.  Recommend repeat exam, <10 yrs? Polyps Removed Today? No.  Recommend repeat exam, <10 yrs? No. ASA CLASS:   Class II INDICATIONS:average risk patient for colon cancer and prior colonoscopy in 2006 in the [place]. MEDICATIONS: Monitored anesthesia care and Propofol 250 mg IV  DESCRIPTION OF PROCEDURE:   After the risks benefits and alternatives of the procedure were thoroughly explained, informed consent was obtained.  The digital rectal exam revealed no abnormalities of the rectum.   The LB [id number]  endoscope was introduced through the anus and advanced to the cecum, which was identified by both the appendix and ileocecal valve. No adverse events experienced.   The quality of the prep was good, using MoviPrep  The instrument was then slowly withdrawn as the colon was fully examined.      COLON FINDINGS: There was mild diverticulosis noted in the sigmoid colon and descending colon.  Retroflexed views revealed no abnormalities. The time to cecum=5 minutes 15 seconds.  Withdrawal time=7 minutes 13 seconds.  The scope was withdrawn and the procedure completed. COMPLICATIONS: There were no immediate complications.  ENDOSCOPIC IMPRESSION: Mild diverticulosis was noted in the sigmoid colon and descending colon  RECOMMENDATIONS: High fiber  diet Recall colonoscopy in 10 years  eSigned:  Lafayette Dragon, MD 05/22/2014 2:19 PM   cc:

## 2014-05-22 NOTE — Progress Notes (Signed)
A/ox3, pleased with MAC, report to RN 

## 2014-05-22 NOTE — Patient Instructions (Signed)
YOU HAD AN ENDOSCOPIC PROCEDURE TODAY AT THE [facility]: Refer to the procedure report that was given to you for any specific questions about what was found during the examination.  If the procedure report does not answer your questions, please call your gastroenterologist to clarify.  If you requested that your care partner not be given the details of your procedure findings, then the procedure report has been included in a sealed envelope for you to review at your convenience later.  YOU SHOULD EXPECT: Some feelings of bloating in the abdomen. Passage of more gas than usual.  Walking can help get rid of the air that was put into your GI tract during the procedure and reduce the bloating. If you had a lower endoscopy (such as a colonoscopy or flexible sigmoidoscopy) you may notice spotting of blood in your stool or on the toilet paper. If you underwent a bowel prep for your procedure, then you may not have a normal bowel movement for a few days.  DIET: Your first meal following the procedure should be a light meal and then it is ok to progress to your normal diet.  A half-sandwich or bowl of soup is an example of a good first meal.  Heavy or fried foods are harder to digest and may make you feel nauseous or bloated.  Likewise meals heavy in dairy and vegetables can cause extra gas to form and this can also increase the bloating.  Drink plenty of fluids but you should avoid alcoholic beverages for 24 hours.  ACTIVITY: Your care partner should take you home directly after the procedure.  You should plan to take it easy, moving slowly for the rest of the day.  You can resume normal activity the day after the procedure however you should NOT DRIVE or use heavy machinery for 24 hours (because of the sedation medicines used during the test).    SYMPTOMS TO REPORT IMMEDIATELY: A gastroenterologist can be reached at any hour.  During normal business hours, 8:30 AM to 5:00 PM Monday through Friday,  call [phone].  After hours and on weekends, please call the GI answering service at [phone] who will take a message and have the physician on call contact you.   Following lower endoscopy (colonoscopy or flexible sigmoidoscopy):  Excessive amounts of blood in the stool  Significant tenderness or worsening of abdominal pains  Swelling of the abdomen that is new, acute  Fever of 100F or higher  FOLLOW UP: If any biopsies were taken you will be contacted by phone or by letter within the next 1-3 weeks.  Call your gastroenterologist if you have not heard about the biopsies in 3 weeks.  Our staff will call the home number listed on your records the next business day following your procedure to check on you and address any questions or concerns that you may have at that time regarding the information given to you following your procedure. This is a courtesy call and so if there is no answer at the home number and we have not heard from you through the emergency physician on call, we will assume that you have returned to your regular daily activities without incident.  SIGNATURES/CONFIDENTIALITY: You and/or your care partner have signed paperwork which will be entered into your electronic medical record.  These signatures attest to the fact that that the information above on your After Visit Summary has been reviewed and is understood.  Full responsibility of the confidentiality of this   discharge information lies with you and/or your care-partner.  Recommendations Discharge instructions given to patient and/or care partner. Diverticulosis and high fiber diet handouts provided.

## 2014-05-23 ENCOUNTER — Telehealth: Payer: Self-pay | Admitting: *Deleted

## 2014-05-23 NOTE — Telephone Encounter (Signed)
  Follow up Call-  Call back number 05/22/2014  Post procedure Call Back phone  # [phone]  Permission to leave phone message Yes     Patient questions:  Do you have a fever, pain , or abdominal swelling? No. Pain Score  0 *  Have you tolerated food without any problems? Yes.    Have you been able to return to your normal activities? Yes.    Do you have any questions about your discharge instructions: Diet   No. Medications  No. Follow up visit  No.  Do you have questions or concerns about your Care? No.  Actions: * If pain score is 4 or above: No action needed, pain <4.

## 2014-08-21 ENCOUNTER — Other Ambulatory Visit: Payer: Self-pay | Admitting: *Deleted

## 2014-08-21 MED ORDER — DILTIAZEM HCL ER COATED BEADS 180 MG PO CP24: 180.0000 mg | ORAL_CAPSULE | Freq: Every day | ORAL | Status: DC

## 2014-08-23 ENCOUNTER — Other Ambulatory Visit: Payer: Self-pay

## 2014-08-23 MED ORDER — DILTIAZEM HCL ER COATED BEADS 180 MG PO CP24: 180.0000 mg | ORAL_CAPSULE | Freq: Every day | ORAL | Status: DC

## 2014-08-30 ENCOUNTER — Encounter: Payer: 59 | Admitting: Internal Medicine

## 2014-09-09 ENCOUNTER — Ambulatory Visit (INDEPENDENT_AMBULATORY_CARE_PROVIDER_SITE_OTHER): Payer: 59 | Admitting: Internal Medicine

## 2014-09-09 ENCOUNTER — Encounter: Payer: Self-pay | Admitting: Internal Medicine

## 2014-09-09 VITALS — BP 120/70 | HR 67 | Temp 98.3°F | Ht 68.0 in | Wt 186.0 lb

## 2014-09-09 DIAGNOSIS — I4819 Other persistent atrial fibrillation: Secondary | ICD-10-CM

## 2014-09-09 DIAGNOSIS — N400 Enlarged prostate without lower urinary tract symptoms: Secondary | ICD-10-CM | POA: Diagnosis not present

## 2014-09-09 DIAGNOSIS — R7301 Impaired fasting glucose: Secondary | ICD-10-CM

## 2014-09-09 DIAGNOSIS — I481 Persistent atrial fibrillation: Secondary | ICD-10-CM

## 2014-09-09 DIAGNOSIS — Z0000 Encounter for general adult medical examination without abnormal findings: Secondary | ICD-10-CM | POA: Diagnosis not present

## 2014-09-09 DIAGNOSIS — E785 Hyperlipidemia, unspecified: Secondary | ICD-10-CM | POA: Diagnosis not present

## 2014-09-09 NOTE — Progress Notes (Signed)
Subjective:    Patient ID: Rodney Gray, male    DOB: 20-Mar-1951, 64 y.o.   MRN: [id number]  HPI Here for physical  Doing well in general Seeing orthopedist tomorrow--follow up with right hip problems (Dr Ninfa Linden) Diagnosed with trochanteric bursitis and got cortisone shot Hurt at first--but then improved Diclofenac Rx as well--uses once or twice a day also with some relief Forgets to use ice  Had been going to gym Had to stop leg presses, etc Doing bicycle still, stretches No other arthritic problems  Still with urinary urgency Usually has at least nocturia x 1 Flow is fine Minimal dribbling  On fish oil for cholesterol  Doesn't feel palpitations No chest pain No dizziness or syncope Does think he tires out easy with exercise at times (but after over an hour in the gym and then came home and cut grass in the heat)  Current Outpatient Prescriptions on File Prior to Visit  Medication Sig Dispense Refill  . aspirin EC 81 MG tablet Take 1 tablet (81 mg total) by mouth daily. 90 tablet 3  . diltiazem (CARDIZEM CD) 180 MG 24 hr capsule Take 1 capsule (180 mg total) by mouth daily. 90 capsule 1  . fluticasone (FLONASE) 50 MCG/ACT nasal spray Place 1 spray into both nostrils daily as needed for allergies or rhinitis.     . Multiple Vitamins-Minerals (MENS MULTIVITAMIN PLUS PO) Take 1 capsule by mouth daily.     . Omega-3 Fatty Acids (FISH OIL) 1200 MG CAPS Take 1 capsule by mouth daily.     No current facility-administered medications on file prior to visit.    Allergies  Allergen Reactions  . Ampicillin     REACTION: hives Has tolerated cephalosporins    Past Medical History  Diagnosis Date  . Osteoarthritis   . Atrial fibrillation     A-Fib, 1987, 2003, 2005, EPS RX 2005 (A-Fib reocurred)  . Cataract   . Allergic rhinitis due to pollen   . BPH (benign prostatic hypertrophy)   . Cancer     skin CA- squamous and Basal cell  . Hyperlipemia     no per pt    . Hypertension     no per pt    Past Surgical History  Procedure Laterality Date  . Retinal tear repair cryotherapy      right 12/06  . Thumb arthroscopy  1999  . Retinal tear repair cryotherapy      Retinal tear/bleed- Right 12/06  . Capsulotomy  08/2007    bilaterally  . Squamous cell carcinoma excision  07/2008    left forearm  . Meniscus repair  6/11    left knee---Dr Blackmon  . Cataract extraction      both eyes  . Colonoscopy    . Nasal sinus surgery  May 2014    Family History  Problem Relation Age of Onset  . Hypertension Father   . Coronary artery disease Neg Hx   . Diabetes Neg Hx   . Cancer Neg Hx     prostate or colon  . Colon cancer Neg Hx   . Rectal cancer Neg Hx   . Stomach cancer Neg Hx   . Esophageal cancer Brother     History   Social History  . Marital Status: Married    Spouse Name: N/A  . Number of Children: N/A  . Years of Education: N/A   Occupational History  . retired Ship broker     Social History  Main Topics  . Smoking status: Never Smoker   . Smokeless tobacco: Never Used  . Alcohol Use: Yes     Comment: occ.  . Drug Use: Not on file  . Sexual Activity: Not on file   Other Topics Concern  . Not on file   Social History Narrative   Review of Systems  Constitutional: Negative for fatigue and unexpected weight change.       Wears seat belt  HENT: Positive for hearing loss. Negative for dental problem and tinnitus.        Considering hearing aides Keeps up with dentist  Eyes: Negative for visual disturbance.       No diplopia or unilateral vision loss  Respiratory: Negative for cough, chest tightness and shortness of breath.   Cardiovascular: Negative for chest pain, palpitations and leg swelling.  Gastrointestinal: Negative for nausea, vomiting, abdominal pain, constipation and blood in stool.       Occ heartburn-- seems to be helped by mint (??)   Endocrine: Negative for polydipsia and polyuria.   Genitourinary: Positive for urgency, frequency and difficulty urinating.       Rare ED--not an issue  Musculoskeletal: Positive for arthralgias. Negative for back pain and joint swelling.  Skin: Negative for rash.       No suspicious lesions ---does keep up with derm  Allergic/Immunologic: Positive for environmental allergies. Negative for immunocompromised state.       Bothered when cutting grass  Neurological: Positive for numbness. Negative for dizziness, syncope, weakness, light-headedness and headaches.       Transient numbness in hands--like prolonged driving. Improves quickly  Hematological: Negative for adenopathy. Does not bruise/bleed easily.  Psychiatric/Behavioral: Positive for sleep disturbance. Negative for dysphoric mood. The patient is nervous/anxious.        Getting new mattress Some family stress-daughter getting divorced       Objective:   Physical Exam  Constitutional: He is oriented to person, place, and time. He appears well-developed and well-nourished. No distress.  HENT:  Head: Normocephalic and atraumatic.  Right Ear: External ear normal.  Left Ear: External ear normal.  Mouth/Throat: Oropharynx is clear and moist. No oropharyngeal exudate.  Eyes: Conjunctivae and EOM are normal. Pupils are equal, round, and reactive to light.  Neck: Normal range of motion. Neck supple. No thyromegaly present.  Cardiovascular: Normal rate, normal heart sounds and intact distal pulses.  Exam reveals no gallop.   No murmur heard. irregular  Pulmonary/Chest: Effort normal and breath sounds normal. No respiratory distress. He has no wheezes. He has no rales.  Abdominal: Soft. There is no tenderness.  Musculoskeletal: He exhibits no edema or tenderness.  Lymphadenopathy:    He has no cervical adenopathy.  Neurological: He is alert and oriented to person, place, and time.  Skin: No rash noted. No erythema.  Psychiatric: He has a normal mood and affect. His behavior is normal.           Assessment & Plan:

## 2014-09-09 NOTE — Assessment & Plan Note (Signed)
Chronic symptoms but not ready for meds tamsulosin if worsens

## 2014-09-09 NOTE — Assessment & Plan Note (Signed)
Just on fish oil

## 2014-09-09 NOTE — Assessment & Plan Note (Signed)
Good rate control Keeps up with cardiologist

## 2014-09-09 NOTE — Assessment & Plan Note (Signed)
Just had colonoscopy Defer PSA to next year Working on fitness

## 2014-09-09 NOTE — Assessment & Plan Note (Signed)
Check A1c. 

## 2014-09-09 NOTE — Progress Notes (Signed)
Pre visit review using our clinic review tool, if applicable. No additional management support is needed unless otherwise documented below in the visit note. 

## 2014-09-10 LAB — COMPREHENSIVE METABOLIC PANEL
ALBUMIN: 4.2 g/dL (ref 3.5–5.2)
ALK PHOS: 48 U/L (ref 39–117)
ALT: 21 U/L (ref 0–53)
AST: 22 U/L (ref 0–37)
BILIRUBIN TOTAL: 0.4 mg/dL (ref 0.2–1.2)
BUN: 19 mg/dL (ref 6–23)
CALCIUM: 9.5 mg/dL (ref 8.4–10.5)
CO2: 30 meq/L (ref 19–32)
Chloride: 104 mEq/L (ref 96–112)
Creatinine, Ser: 0.91 mg/dL (ref 0.40–1.50)
GFR: 89.11 mL/min (ref >60.00)
Glucose, Bld: 77 mg/dL (ref 70–99)
Potassium: 4.6 mEq/L (ref 3.5–5.1)
Sodium: 138 mEq/L (ref 135–145)
TOTAL PROTEIN: 6.6 g/dL (ref 6.0–8.3)

## 2014-09-10 LAB — CBC WITH DIFFERENTIAL/PLATELET
Basophils Absolute: 0 10*3/uL (ref 0.0–0.1)
Basophils Relative: 0.2 % (ref 0.0–3.0)
Eosinophils Absolute: 0.1 10*3/uL (ref 0.0–0.7)
Eosinophils Relative: 1.8 % (ref 0.0–5.0)
HEMATOCRIT: 42.2 % (ref 39.0–52.0)
HEMOGLOBIN: 14.1 g/dL (ref 13.0–17.0)
LYMPHS PCT: 33 % (ref 12.0–46.0)
Lymphs Abs: 2 10*3/uL (ref 0.7–4.0)
MCHC: 33.5 g/dL (ref 30.0–36.0)
MCV: 94 fl (ref 78.0–100.0)
MONOS PCT: 7 % (ref 3.0–12.0)
Monocytes Absolute: 0.4 10*3/uL (ref 0.1–1.0)
Neutro Abs: 3.5 10*3/uL (ref 1.4–7.7)
Neutrophils Relative %: 58 % (ref 43.0–77.0)
Platelets: 220 10*3/uL (ref 150.0–400.0)
RBC: 4.48 Mil/uL (ref 4.22–5.81)
RDW: 13.9 % (ref 11.5–15.5)
WBC: 6 10*3/uL (ref 4.0–10.5)

## 2014-09-10 LAB — T4, FREE: Free T4: 0.99 ng/dL (ref 0.60–1.60)

## 2014-09-10 LAB — LIPID PANEL
CHOL/HDL RATIO: 4
Cholesterol: 192 mg/dL (ref 0–200)
HDL: 48.1 mg/dL (ref >39.00)
LDL CALC: 117 mg/dL — AB (ref 0–99)
NonHDL: 143.9
TRIGLYCERIDES: 135 mg/dL (ref 0.0–149.0)
VLDL: 27 mg/dL (ref 0.0–40.0)

## 2014-09-10 LAB — HEMOGLOBIN A1C: Hgb A1c MFr Bld: 6.2 % (ref 4.6–6.5)

## 2014-12-20 ENCOUNTER — Ambulatory Visit (INDEPENDENT_AMBULATORY_CARE_PROVIDER_SITE_OTHER): Payer: 59

## 2014-12-20 DIAGNOSIS — Z23 Encounter for immunization: Secondary | ICD-10-CM

## 2015-04-30 ENCOUNTER — Encounter: Payer: Self-pay | Admitting: Internal Medicine

## 2015-04-30 ENCOUNTER — Ambulatory Visit (INDEPENDENT_AMBULATORY_CARE_PROVIDER_SITE_OTHER): Payer: 59 | Admitting: Internal Medicine

## 2015-04-30 VITALS — BP 132/80 | HR 57 | Ht 68.0 in | Wt 188.0 lb

## 2015-04-30 DIAGNOSIS — I482 Chronic atrial fibrillation, unspecified: Secondary | ICD-10-CM

## 2015-04-30 DIAGNOSIS — R42 Dizziness and giddiness: Secondary | ICD-10-CM | POA: Diagnosis not present

## 2015-04-30 NOTE — Patient Instructions (Addendum)
Medication Instructions:  Your physician recommends that you continue on your current medications as directed. Please refer to the Current Medication list given to you today.  Labwork: None ordered  Testing/Procedures: None ordered  Follow-Up: Your physician wants you to follow-up in: 1 year with Dr. Lovena Le.  You will receive a reminder letter in the mail two months in advance. If you don't receive a letter, please call our office to schedule the follow-up appointment.   Any Other Special Instructions Will Be Listed Below (If Applicable).  If you need a refill on your cardiac medications before your next appointment, please call your pharmacy.  Thank you for choosing [facility]!!

## 2015-04-30 NOTE — Progress Notes (Signed)
HPI Rodney Gray returns today for followup. He is a very pleasant 65 year old man with chronic atrial fibrillation. He has done well in the interim. His blood pressure is well controlled on low-dose calcium channel blockers. He denies palpitations. No syncope, cough, chest pain, or shortness of breath. No peripheral edema. His only complaint today is that when he eats he will get lightheaded although he has not passed out.  Allergies  Allergen Reactions  . Ampicillin     REACTION: hives Has tolerated cephalosporins     Current Outpatient Prescriptions  Medication Sig Dispense Refill  . aspirin EC 81 MG tablet Take 1 tablet (81 mg total) by mouth daily. 90 tablet 3  . diltiazem (CARDIZEM CD) 180 MG 24 hr capsule Take 1 capsule (180 mg total) by mouth daily. 90 capsule 1  . gentamicin ointment (GARAMYCIN) 0.1 % Apply 1 application topically daily.    . Multiple Vitamins-Minerals (MENS MULTIVITAMIN PLUS PO) Take 1 capsule by mouth daily.     . Omega-3 Fatty Acids (FISH OIL) 1200 MG CAPS Take 1 capsule by mouth daily.     No current facility-administered medications for this visit.     Past Medical History  Diagnosis Date  . Osteoarthritis   . Atrial fibrillation ([place])     A-Fib, 1987, 2003, 2005, EPS RX 2005 (A-Fib reocurred)  . Cataract   . Allergic rhinitis due to pollen   . BPH (benign prostatic hypertrophy)   . Cancer ([place])     skin CA- squamous and Basal cell  . Hyperlipemia     no per pt  . Hypertension     no per pt    ROS:   All systems reviewed and negative except as noted in the HPI.   Past Surgical History  Procedure Laterality Date  . Retinal tear repair cryotherapy      right 12/06  . Thumb arthroscopy  1999  . Retinal tear repair cryotherapy      Retinal tear/bleed- Right 12/06  . Capsulotomy  08/2007    bilaterally  . Squamous cell carcinoma excision  07/2008    left forearm  . Meniscus repair  6/11    left knee---Dr Blackmon  . Cataract  extraction      both eyes  . Colonoscopy    . Nasal sinus surgery  May 2014     Family History  Problem Relation Age of Onset  . Hypertension Father   . Coronary artery disease Neg Hx   . Diabetes Neg Hx   . Cancer Neg Hx     prostate or colon  . Colon cancer Neg Hx   . Rectal cancer Neg Hx   . Stomach cancer Neg Hx   . Esophageal cancer Brother      Social History   Social History  . Marital Status: Married    Spouse Name: N/A  . Number of Children: N/A  . Years of Education: N/A   Occupational History  . retired Ship broker     Social History Main Topics  . Smoking status: Never Smoker   . Smokeless tobacco: Never Used  . Alcohol Use: Yes     Comment: occ.  . Drug Use: Not on file  . Sexual Activity: Not on file   Other Topics Concern  . Not on file   Social History Narrative     BP 132/80 mmHg  Pulse 57  Ht 5\' 8"  (1.727 m)  Wt 188 lb (85.276 kg)  BMI  28.59 kg/m2  Physical Exam:  Well appearing middle-aged man, NAD HEENT: Unremarkable Neck:  7 cm JVD, no thyromegally Lungs:  Clear with no wheezes, rales, or rhonchi. HEART:  IRegular rate rhythm, no murmurs, no rubs, no clicks Abd:  soft, positive bowel sounds, no organomegally, no rebound, no guarding Ext:  2 plus pulses, no edema, no cyanosis, no clubbing Skin:  No rashes no nodules Neuro:  CN II through XII intact, motor grossly intact  EKG Atrial fibrillation with controlled ventricular response.  Assess/Plan:

## 2015-04-30 NOTE — Assessment & Plan Note (Signed)
His ventricular rate is well controlled. No change in meds. 

## 2015-04-30 NOTE — Assessment & Plan Note (Signed)
His symptoms sound like a mild case of deglutition presyncope. He will eat slower, and more often with smaller meals. He will increase his fluid intake. Episodes only occur while swallowing food.

## 2015-09-02 ENCOUNTER — Other Ambulatory Visit: Payer: Self-pay | Admitting: Internal Medicine

## 2015-09-12 ENCOUNTER — Ambulatory Visit (INDEPENDENT_AMBULATORY_CARE_PROVIDER_SITE_OTHER): Payer: Medicare Other | Admitting: Internal Medicine

## 2015-09-12 ENCOUNTER — Encounter: Payer: Self-pay | Admitting: Internal Medicine

## 2015-09-12 VITALS — BP 112/70 | HR 66 | Temp 97.6°F | Ht 68.0 in | Wt 173.0 lb

## 2015-09-12 DIAGNOSIS — N400 Enlarged prostate without lower urinary tract symptoms: Secondary | ICD-10-CM

## 2015-09-12 DIAGNOSIS — Z7189 Other specified counseling: Secondary | ICD-10-CM | POA: Diagnosis not present

## 2015-09-12 DIAGNOSIS — I482 Chronic atrial fibrillation, unspecified: Secondary | ICD-10-CM

## 2015-09-12 DIAGNOSIS — J301 Allergic rhinitis due to pollen: Secondary | ICD-10-CM

## 2015-09-12 DIAGNOSIS — Z0000 Encounter for general adult medical examination without abnormal findings: Secondary | ICD-10-CM

## 2015-09-12 DIAGNOSIS — Z23 Encounter for immunization: Secondary | ICD-10-CM

## 2015-09-12 LAB — CBC WITH DIFFERENTIAL/PLATELET
BASOS ABS: 0 10*3/uL (ref 0.0–0.1)
Basophils Relative: 0.6 % (ref 0.0–3.0)
EOS ABS: 0.1 10*3/uL (ref 0.0–0.7)
EOS PCT: 1.5 % (ref 0.0–5.0)
HEMATOCRIT: 45 % (ref 39.0–52.0)
HEMOGLOBIN: 14.9 g/dL (ref 13.0–17.0)
Lymphocytes Relative: 24.9 % (ref 12.0–46.0)
Lymphs Abs: 1.6 10*3/uL (ref 0.7–4.0)
MCHC: 33.1 g/dL (ref 30.0–36.0)
MCV: 94.3 fl (ref 78.0–100.0)
MONO ABS: 0.6 10*3/uL (ref 0.1–1.0)
MONOS PCT: 9.2 % (ref 3.0–12.0)
Neutro Abs: 4.2 10*3/uL (ref 1.4–7.7)
Neutrophils Relative %: 63.8 % (ref 43.0–77.0)
Platelets: 236 10*3/uL (ref 150.0–400.0)
RBC: 4.77 Mil/uL (ref 4.22–5.81)
RDW: 13.2 % (ref 11.5–15.5)
WBC: 6.6 10*3/uL (ref 4.0–10.5)

## 2015-09-12 LAB — COMPREHENSIVE METABOLIC PANEL
ALBUMIN: 4.6 g/dL (ref 3.5–5.2)
ALK PHOS: 50 U/L (ref 39–117)
ALT: 17 U/L (ref 0–53)
AST: 20 U/L (ref 0–37)
BILIRUBIN TOTAL: 1 mg/dL (ref 0.2–1.2)
BUN: 22 mg/dL (ref 6–23)
CALCIUM: 9.9 mg/dL (ref 8.4–10.5)
CO2: 30 mEq/L (ref 19–32)
CREATININE: 0.81 mg/dL (ref 0.40–1.50)
Chloride: 104 mEq/L (ref 96–112)
GFR: 101.6 mL/min (ref >60.00)
Glucose, Bld: 92 mg/dL (ref 70–99)
Potassium: 5 mEq/L (ref 3.5–5.1)
Sodium: 141 mEq/L (ref 135–145)
Total Protein: 6.9 g/dL (ref 6.0–8.3)

## 2015-09-12 LAB — T4, FREE: FREE T4: 1.09 ng/dL (ref 0.60–1.60)

## 2015-09-12 LAB — PSA: PSA: 0.96 ng/mL (ref 0.10–4.00)

## 2015-09-12 MED ORDER — FLUTICASONE PROPIONATE 50 MCG/ACT NA SUSP: 2.0000 | Freq: Every day | NASAL | Status: DC

## 2015-09-12 NOTE — Progress Notes (Signed)
Subjective:    Patient ID: Rodney Gray, male    DOB: [birth year], 65 y.o.   MRN: [id number]  HPI Here for initial Medicare preventative visit and follow up of chronic health issues Reviewed form and advanced directives Reviewed other doctors Occasional drink No tobacco Vision is fine Hearing is poor--not sure if he wants to consider hearing aides (will review with ENT) No falls No depression or anhedonia Independent with instrumental ADLs Mild short term memory issues--nothing worrisome.  Has been going to the gym regularly Has lost ~15# Feels good  Has ongoing problems with urination Gets urgency when he gets up or comes into the house This is bothersome but not bad enough for meds Nocturia is intermittent  Heart has been fine No palpitations No chest pain Some dizziness when eating in past---this hasn't happened in a while No edema Very slight orthostatic dizziness--if bending over and gets up really quickly No dizziness with extensive work outs  Some allergy symptoms Ran out of flonase Will go ahead with refill Uses gentamicin for lubrication for nose--not recently. Will review with ENT  Current Outpatient Prescriptions on File Prior to Visit  Medication Sig Dispense Refill  . aspirin EC 81 MG tablet Take 1 tablet (81 mg total) by mouth daily. 90 tablet 3  . diltiazem (CARDIZEM CD) 180 MG 24 hr capsule TAKE 1 CAPSULE (180 MG TOTAL) BY MOUTH DAILY. 90 capsule 1  . Multiple Vitamins-Minerals (MENS MULTIVITAMIN PLUS PO) Take 1 capsule by mouth daily.     . Omega-3 Fatty Acids (FISH OIL) 1200 MG CAPS Take 1 capsule by mouth daily.    Marland Kitchen gentamicin ointment (GARAMYCIN) 0.1 % Apply 1 application topically daily. Reported on 09/12/2015     No current facility-administered medications on file prior to visit.    Allergies  Allergen Reactions  . Ampicillin     REACTION: hives Has tolerated cephalosporins    Past Medical History  Diagnosis Date  .  Osteoarthritis   . Atrial fibrillation ([place])     A-Fib, 1987, 2003, 2005, EPS RX 2005 (A-Fib reocurred)  . Cataract   . Allergic rhinitis due to pollen   . BPH (benign prostatic hypertrophy)   . Cancer ([place])     skin CA- squamous and Basal cell  . Hyperlipemia     no per pt  . Hypertension     no per pt    Past Surgical History  Procedure Laterality Date  . Retinal tear repair cryotherapy      right 12/06  . Thumb arthroscopy  1999  . Retinal tear repair cryotherapy      Retinal tear/bleed- Right 12/06  . Capsulotomy  08/2007    bilaterally  . Squamous cell carcinoma excision  07/2008    left forearm  . Meniscus repair  6/11    left knee---Dr Blackmon  . Cataract extraction      both eyes  . Colonoscopy    . Nasal sinus surgery  May 2014    Family History  Problem Relation Age of Onset  . Hypertension Father   . Coronary artery disease Neg Hx   . Diabetes Neg Hx   . Cancer Neg Hx     prostate or colon  . Colon cancer Neg Hx   . Rectal cancer Neg Hx   . Stomach cancer Neg Hx   . Esophageal cancer Brother     Social History   Social History  . Marital Status: Married    Spouse  Name: N/A  . Number of Children: N/A  . Years of Education: N/A   Occupational History  . retired Ship broker     Social History Main Topics  . Smoking status: Never Smoker   . Smokeless tobacco: Never Used  . Alcohol Use: Yes     Comment: occ.  . Drug Use: Not on file  . Sexual Activity: Not on file   Other Topics Concern  . Not on file   Social History Narrative   No living will--plans to do this.   Wife should make health care decisions for him.   Would accept resuscitation attempts   Would accept tube feeds for some time--depending on prognosis   Review of Systems Appetite is good Sleeps variably-- "in chunks". If awakens, can take a while to get back to sleep New mattress-- so no hip pain now. But sleeps with pillow inbetween knees still Wears seat  belt Recent root canal--keeps up with dentist No rash or suspicious lesions. Does see derm yearly--had biopsy on forehead (precancer) No other major arthritis problems Bowels are fine. No blood in stool Occasional ED--like if tired. Usually does okay.    Objective:   Physical Exam  Constitutional: He is oriented to person, place, and time. He appears well-developed and well-nourished. No distress.  HENT:  Mouth/Throat: Oropharynx is clear and moist. No oropharyngeal exudate.  Neck: Normal range of motion. Neck supple. No thyromegaly present.  Cardiovascular: Normal rate, normal heart sounds and intact distal pulses.  Exam reveals no gallop.   No murmur heard. irregular  Pulmonary/Chest: Effort normal and breath sounds normal. No respiratory distress. He has no wheezes. He has no rales.  Abdominal: Soft. There is no tenderness.  Musculoskeletal: He exhibits no edema or tenderness.  Lymphadenopathy:    He has no cervical adenopathy.  Neurological: He is alert and oriented to person, place, and time.  President-- "Daisy Floro, Obama, Bush" [phone] [id number] Recall 3/3  Skin: No rash noted. No erythema.  Psychiatric: He has a normal mood and affect. His behavior is normal.          Assessment & Plan:

## 2015-09-12 NOTE — Assessment & Plan Note (Signed)
Good rate control ASA only

## 2015-09-12 NOTE — Addendum Note (Signed)
Addended by: Pilar Grammes on: 09/12/2015 01:02 PM   Modules accepted: Orders

## 2015-09-12 NOTE — Progress Notes (Signed)
Pre visit review using our clinic review tool, if applicable. No additional management support is needed unless otherwise documented below in the visit note. 

## 2015-09-12 NOTE — Assessment & Plan Note (Signed)
Will refill the flonase

## 2015-09-12 NOTE — Assessment & Plan Note (Signed)
I have personally reviewed the Medicare Annual Wellness questionnaire and have noted 1. The patient's medical and social history 2. Their use of alcohol, tobacco or illicit drugs 3. Their current medications and supplements 4. The patient's functional ability including ADL's, fall risks, home safety risks and hearing or visual             impairment. 5. Diet and physical activities 6. Evidence for depression or mood disorders  The patients weight, height, BMI and visual acuity have been recorded in the chart I have made referrals, counseling and provided education to the patient based review of the above and I have provided the pt with a written personalized care plan for preventive services.  I have provided you with a copy of your personalized plan for preventive services. Please take the time to review along with your updated medication list.  prevnar today Pneumovax next year Colon due 2026 Will check PSA Doing great with fitness No reason for AAA screen

## 2015-09-12 NOTE — Assessment & Plan Note (Signed)
Mild symptoms He is not ready for med If worsens, would try tamsulosin

## 2015-09-12 NOTE — Assessment & Plan Note (Signed)
See social history Blank forms given 

## 2015-12-10 ENCOUNTER — Ambulatory Visit (INDEPENDENT_AMBULATORY_CARE_PROVIDER_SITE_OTHER): Payer: Medicare Other

## 2015-12-10 DIAGNOSIS — Z23 Encounter for immunization: Secondary | ICD-10-CM | POA: Diagnosis not present

## 2015-12-18 DIAGNOSIS — Z9841 Cataract extraction status, right eye: Secondary | ICD-10-CM | POA: Diagnosis not present

## 2015-12-18 DIAGNOSIS — Z9842 Cataract extraction status, left eye: Secondary | ICD-10-CM | POA: Diagnosis not present

## 2015-12-18 DIAGNOSIS — H59811 Chorioretinal scars after surgery for detachment, right eye: Secondary | ICD-10-CM | POA: Diagnosis not present

## 2015-12-18 DIAGNOSIS — H524 Presbyopia: Secondary | ICD-10-CM | POA: Diagnosis not present

## 2016-02-14 DIAGNOSIS — I10 Essential (primary) hypertension: Secondary | ICD-10-CM | POA: Diagnosis not present

## 2016-02-14 DIAGNOSIS — J209 Acute bronchitis, unspecified: Secondary | ICD-10-CM | POA: Diagnosis not present

## 2016-03-01 ENCOUNTER — Other Ambulatory Visit: Payer: Self-pay | Admitting: Internal Medicine

## 2016-03-08 DIAGNOSIS — L728 Other follicular cysts of the skin and subcutaneous tissue: Secondary | ICD-10-CM | POA: Diagnosis not present

## 2016-03-08 DIAGNOSIS — L570 Actinic keratosis: Secondary | ICD-10-CM | POA: Diagnosis not present

## 2016-03-08 DIAGNOSIS — Z1283 Encounter for screening for malignant neoplasm of skin: Secondary | ICD-10-CM | POA: Diagnosis not present

## 2016-03-08 DIAGNOSIS — Z85828 Personal history of other malignant neoplasm of skin: Secondary | ICD-10-CM | POA: Diagnosis not present

## 2016-03-08 DIAGNOSIS — Z08 Encounter for follow-up examination after completed treatment for malignant neoplasm: Secondary | ICD-10-CM | POA: Diagnosis not present

## 2016-04-09 ENCOUNTER — Encounter: Payer: Self-pay | Admitting: *Deleted

## 2016-04-27 ENCOUNTER — Ambulatory Visit (INDEPENDENT_AMBULATORY_CARE_PROVIDER_SITE_OTHER): Payer: Medicare Other | Admitting: Internal Medicine

## 2016-04-27 ENCOUNTER — Encounter: Payer: Self-pay | Admitting: Internal Medicine

## 2016-04-27 VITALS — BP 122/80 | HR 77 | Ht 68.0 in | Wt 178.6 lb

## 2016-04-27 DIAGNOSIS — I482 Chronic atrial fibrillation, unspecified: Secondary | ICD-10-CM

## 2016-04-27 DIAGNOSIS — R42 Dizziness and giddiness: Secondary | ICD-10-CM

## 2016-04-27 MED ORDER — DILTIAZEM HCL ER COATED BEADS 180 MG PO CP24
ORAL_CAPSULE | ORAL | 3 refills | Status: DC
Start: 2016-04-27 — End: 2017-05-13

## 2016-04-27 NOTE — Progress Notes (Signed)
HPI Rodney Gray returns today for followup. He is a very pleasant 66 year old man with chronic atrial fibrillation. He has done well in the interim. His HR is well controlled on low-dose calcium channel blockers. He denies palpitations. No syncope, cough, chest pain, or shortness of breath. No peripheral edema. His only complaint today is that he has recovered from bronchitis.  Allergies  Allergen Reactions  . Ampicillin     REACTION: hives Has tolerated cephalosporins     Current Outpatient Prescriptions  Medication Sig Dispense Refill  . aspirin EC 81 MG tablet Take 1 tablet (81 mg total) by mouth daily. 90 tablet 3  . diltiazem (CARDIZEM CD) 180 MG 24 hr capsule TAKE 1 CAPSULE (180 MG TOTAL) BY MOUTH DAILY. 90 capsule 0  . fluticasone (FLONASE) 50 MCG/ACT nasal spray Place 2 sprays into both nostrils daily as needed for allergies or rhinitis.    . Multiple Vitamins-Minerals (MENS MULTIVITAMIN PLUS PO) Take 1 capsule by mouth daily.     . Omega-3 Fatty Acids (FISH OIL) 1200 MG CAPS Take 1 capsule by mouth daily.     No current facility-administered medications for this visit.      Past Medical History:  Diagnosis Date  . Allergic rhinitis due to pollen   . Atrial fibrillation ([place])    A-Fib, 1987, 2003, 2005, EPS RX 2005 (A-Fib reocurred)  . BPH (benign prostatic hypertrophy)   . Cancer ([place])    skin CA- squamous and Basal cell  . Cataract   . Hyperlipemia    no per pt  . Hypertension    no per pt  . Osteoarthritis     ROS:   All systems reviewed and negative except as noted in the HPI.   Past Surgical History:  Procedure Laterality Date  . CAPSULOTOMY  08/2007   bilaterally  . CATARACT EXTRACTION     both eyes  . COLONOSCOPY    . MENISCUS REPAIR  6/11   left knee---Dr Blackmon  . NASAL SINUS SURGERY  May 2014  . RETINAL TEAR REPAIR CRYOTHERAPY     right 12/06  . RETINAL TEAR REPAIR CRYOTHERAPY     Retinal tear/bleed- Right 12/06  . SQUAMOUS CELL  CARCINOMA EXCISION  07/2008   left forearm  . THUMB ARTHROSCOPY  1999     Family History  Problem Relation Age of Onset  . Hypertension Father   . Cirrhosis Father 105  . Alcohol abuse Father   . Esophageal cancer Brother   . Coronary artery disease Neg Hx   . Diabetes Neg Hx   . Cancer Neg Hx     prostate or colon  . Colon cancer Neg Hx   . Rectal cancer Neg Hx   . Stomach cancer Neg Hx      Social History   Social History  . Marital status: Married    Spouse name: N/A  . Number of children: N/A  . Years of education: N/A   Occupational History  . retired Ship broker  Retired   Social History Main Topics  . Smoking status: Never Smoker  . Smokeless tobacco: Never Used  . Alcohol use Yes     Comment: occ.  . Drug use: Unknown  . Sexual activity: Not on file   Other Topics Concern  . Not on file   Social History Narrative   No living will--plans to do this.   Wife should make health care decisions for him.   Would accept resuscitation attempts  Would accept tube feeds for some time--depending on prognosis     BP 122/80   Pulse 77   Ht 5\' 8"  (1.727 m)   Wt 178 lb 9.6 oz (81 kg)   BMI 27.16 kg/m   Physical Exam:  Well appearing middle-aged man, NAD HEENT: Unremarkable Neck:  7 cm JVD, no thyromegally Lungs:  Clear with no wheezes, rales, or rhonchi. HEART:  IRegular rate rhythm, no murmurs, no rubs, no clicks Abd:  soft, positive bowel sounds, no organomegally, no rebound, no guarding Ext:  2 plus pulses, no edema, no cyanosis, no clubbing Skin:  No rashes no nodules Neuro:  CN II through XII intact, motor grossly intact  EKG Atrial fibrillation with controlled ventricular response.  Assess/Plan:  1. Atrial fib - his ventricular rate is well controlled. He will continue his current meds.  2. Coags - he will continue asa. He denies HTN. I have asked the patient to record his blood pressure over the next 2-3 months and keep a log. If  his pressures are high, then we will start a NOAC. I am not convinced at this point that he has HTN. 3. Bronchitis - he appears to have resolved this problem. Will follow. Rodney Gray.D.

## 2016-04-27 NOTE — Patient Instructions (Addendum)
Medication Instructions:  Your physician recommends that you continue on your current medications as directed. Please refer to the Current Medication list given to you today.   Labwork: None Ordered   Testing/Procedures: None Ordered   Follow-Up: Your physician wants you to follow-up in: 1 year with Dr. Lovena Le.  You will receive a reminder letter in the mail two months in advance. If you don't receive a letter, please call our office to schedule the follow-up appointment.   Any Other Special Instructions Will Be Listed Below (If Applicable). Patient will record blood pressure readings for 2-3 months and drop readings off at our office for Dr. Lovena Le to review.    If you need a refill on your cardiac medications before your next appointment, please call your pharmacy.

## 2016-05-21 ENCOUNTER — Ambulatory Visit (INDEPENDENT_AMBULATORY_CARE_PROVIDER_SITE_OTHER): Payer: Medicare Other | Admitting: Internal Medicine

## 2016-05-21 ENCOUNTER — Encounter: Payer: Self-pay | Admitting: Internal Medicine

## 2016-05-21 VITALS — BP 102/70 | HR 80 | Temp 97.3°F | Wt 182.0 lb

## 2016-05-21 DIAGNOSIS — K602 Anal fissure, unspecified: Secondary | ICD-10-CM | POA: Diagnosis not present

## 2016-05-21 MED ORDER — NITROGLYCERIN 0.4 % RE OINT: 1.0000 "application " | TOPICAL_OINTMENT | Freq: Three times a day (TID) | RECTAL | 1 refills | Status: DC

## 2016-05-21 NOTE — Progress Notes (Signed)
Subjective:    Patient ID: Rodney Gray, male    DOB: 1950-04-06, 66 y.o.   MRN: [id number]  HPI Here due to a rectal problem Thinks he has a fissure right at anus Painful at times  Noticed it about 5 days ago--after shower Using wipes to clean and triple antibiotic cream (that doesn't hurt) No bleeding Bowels are normal--no straining No drainage  Current Outpatient Prescriptions on File Prior to Visit  Medication Sig Dispense Refill  . aspirin EC 81 MG tablet Take 1 tablet (81 mg total) by mouth daily. 90 tablet 3  . diltiazem (CARDIZEM CD) 180 MG 24 hr capsule TAKE 1 CAPSULE (180 MG TOTAL) BY MOUTH DAILY. 90 capsule 3  . fluticasone (FLONASE) 50 MCG/ACT nasal spray Place 2 sprays into both nostrils daily as needed for allergies or rhinitis.    . Multiple Vitamins-Minerals (MENS MULTIVITAMIN PLUS PO) Take 1 capsule by mouth daily.     . Omega-3 Fatty Acids (FISH OIL) 1200 MG CAPS Take 1 capsule by mouth daily.     No current facility-administered medications on file prior to visit.     Allergies  Allergen Reactions  . Ampicillin     REACTION: hives Has tolerated cephalosporins    Past Medical History:  Diagnosis Date  . Allergic rhinitis due to pollen   . Atrial fibrillation ([place])    A-Fib, 1987, 2003, 2005, EPS RX 2005 (A-Fib reocurred)  . BPH (benign prostatic hypertrophy)   . Cancer ([place])    skin CA- squamous and Basal cell  . Cataract   . Hyperlipemia    no per pt  . Hypertension    no per pt  . Osteoarthritis     Past Surgical History:  Procedure Laterality Date  . CAPSULOTOMY  08/2007   bilaterally  . CATARACT EXTRACTION     both eyes  . COLONOSCOPY    . MENISCUS REPAIR  6/11   left knee---Dr Blackmon  . NASAL SINUS SURGERY  May 2014  . RETINAL TEAR REPAIR CRYOTHERAPY     right 12/06  . RETINAL TEAR REPAIR CRYOTHERAPY     Retinal tear/bleed- Right 12/06  . SQUAMOUS CELL CARCINOMA EXCISION  07/2008   left forearm  . THUMB ARTHROSCOPY   1999    Family History  Problem Relation Age of Onset  . Hypertension Father   . Cirrhosis Father 35  . Alcohol abuse Father   . Esophageal cancer Brother   . Coronary artery disease Neg Hx   . Diabetes Neg Hx   . Cancer Neg Hx     prostate or colon  . Colon cancer Neg Hx   . Rectal cancer Neg Hx   . Stomach cancer Neg Hx     Social History   Social History  . Marital status: Married    Spouse name: N/A  . Number of children: N/A  . Years of education: N/A   Occupational History  . retired Ship broker  Retired   Social History Main Topics  . Smoking status: Never Smoker  . Smokeless tobacco: Never Used  . Alcohol use Yes     Comment: occ.  . Drug use: Unknown  . Sexual activity: Not on file   Other Topics Concern  . Not on file   Social History Narrative   No living will--plans to do this.   Wife should make health care decisions for him.   Would accept resuscitation attempts   Would accept tube feeds for  some time--depending on prognosis   Review of Systems Appetite is good No fever No abdominal pain    Objective:   Physical Exam  Genitourinary:  Genitourinary Comments: 2 shallow fissures towards perineum No hemorrhoids No fistula or drainage          Assessment & Plan:

## 2016-05-21 NOTE — Progress Notes (Signed)
Pre visit review using our clinic review tool, if applicable. No additional management support is needed unless otherwise documented below in the visit note. 

## 2016-05-21 NOTE — Assessment & Plan Note (Signed)
Fairly superficial Not constipated Discussed sitz baths and gentle cleaning Will try nitro ointment

## 2016-05-21 NOTE — Patient Instructions (Signed)
Please try sitz baths as well as the ointment.

## 2016-06-03 DIAGNOSIS — M5416 Radiculopathy, lumbar region: Secondary | ICD-10-CM | POA: Diagnosis not present

## 2016-06-03 DIAGNOSIS — M9903 Segmental and somatic dysfunction of lumbar region: Secondary | ICD-10-CM | POA: Diagnosis not present

## 2016-06-03 DIAGNOSIS — M9905 Segmental and somatic dysfunction of pelvic region: Secondary | ICD-10-CM | POA: Diagnosis not present

## 2016-06-03 DIAGNOSIS — M955 Acquired deformity of pelvis: Secondary | ICD-10-CM | POA: Diagnosis not present

## 2016-06-03 DIAGNOSIS — M5136 Other intervertebral disc degeneration, lumbar region: Secondary | ICD-10-CM | POA: Diagnosis not present

## 2016-06-07 DIAGNOSIS — M9905 Segmental and somatic dysfunction of pelvic region: Secondary | ICD-10-CM | POA: Diagnosis not present

## 2016-06-07 DIAGNOSIS — M5136 Other intervertebral disc degeneration, lumbar region: Secondary | ICD-10-CM | POA: Diagnosis not present

## 2016-06-07 DIAGNOSIS — M955 Acquired deformity of pelvis: Secondary | ICD-10-CM | POA: Diagnosis not present

## 2016-06-07 DIAGNOSIS — M5416 Radiculopathy, lumbar region: Secondary | ICD-10-CM | POA: Diagnosis not present

## 2016-06-07 DIAGNOSIS — M9903 Segmental and somatic dysfunction of lumbar region: Secondary | ICD-10-CM | POA: Diagnosis not present

## 2016-06-08 DIAGNOSIS — M9905 Segmental and somatic dysfunction of pelvic region: Secondary | ICD-10-CM | POA: Diagnosis not present

## 2016-06-08 DIAGNOSIS — M9903 Segmental and somatic dysfunction of lumbar region: Secondary | ICD-10-CM | POA: Diagnosis not present

## 2016-06-08 DIAGNOSIS — M5136 Other intervertebral disc degeneration, lumbar region: Secondary | ICD-10-CM | POA: Diagnosis not present

## 2016-06-08 DIAGNOSIS — M5416 Radiculopathy, lumbar region: Secondary | ICD-10-CM | POA: Diagnosis not present

## 2016-06-08 DIAGNOSIS — M955 Acquired deformity of pelvis: Secondary | ICD-10-CM | POA: Diagnosis not present

## 2016-06-09 DIAGNOSIS — M9903 Segmental and somatic dysfunction of lumbar region: Secondary | ICD-10-CM | POA: Diagnosis not present

## 2016-06-09 DIAGNOSIS — M5136 Other intervertebral disc degeneration, lumbar region: Secondary | ICD-10-CM | POA: Diagnosis not present

## 2016-06-09 DIAGNOSIS — M955 Acquired deformity of pelvis: Secondary | ICD-10-CM | POA: Diagnosis not present

## 2016-06-09 DIAGNOSIS — M5416 Radiculopathy, lumbar region: Secondary | ICD-10-CM | POA: Diagnosis not present

## 2016-06-09 DIAGNOSIS — M9905 Segmental and somatic dysfunction of pelvic region: Secondary | ICD-10-CM | POA: Diagnosis not present

## 2016-06-10 DIAGNOSIS — M955 Acquired deformity of pelvis: Secondary | ICD-10-CM | POA: Diagnosis not present

## 2016-06-10 DIAGNOSIS — M9905 Segmental and somatic dysfunction of pelvic region: Secondary | ICD-10-CM | POA: Diagnosis not present

## 2016-06-10 DIAGNOSIS — M5136 Other intervertebral disc degeneration, lumbar region: Secondary | ICD-10-CM | POA: Diagnosis not present

## 2016-06-10 DIAGNOSIS — M5416 Radiculopathy, lumbar region: Secondary | ICD-10-CM | POA: Diagnosis not present

## 2016-06-10 DIAGNOSIS — M9903 Segmental and somatic dysfunction of lumbar region: Secondary | ICD-10-CM | POA: Diagnosis not present

## 2016-06-14 ENCOUNTER — Ambulatory Visit (INDEPENDENT_AMBULATORY_CARE_PROVIDER_SITE_OTHER): Payer: Self-pay | Admitting: Orthopaedic Surgery

## 2016-06-14 DIAGNOSIS — M5416 Radiculopathy, lumbar region: Secondary | ICD-10-CM | POA: Diagnosis not present

## 2016-06-14 DIAGNOSIS — M9903 Segmental and somatic dysfunction of lumbar region: Secondary | ICD-10-CM | POA: Diagnosis not present

## 2016-06-14 DIAGNOSIS — M9905 Segmental and somatic dysfunction of pelvic region: Secondary | ICD-10-CM | POA: Diagnosis not present

## 2016-06-14 DIAGNOSIS — M5136 Other intervertebral disc degeneration, lumbar region: Secondary | ICD-10-CM | POA: Diagnosis not present

## 2016-06-16 DIAGNOSIS — M9903 Segmental and somatic dysfunction of lumbar region: Secondary | ICD-10-CM | POA: Diagnosis not present

## 2016-06-16 DIAGNOSIS — M5136 Other intervertebral disc degeneration, lumbar region: Secondary | ICD-10-CM | POA: Diagnosis not present

## 2016-06-16 DIAGNOSIS — M9905 Segmental and somatic dysfunction of pelvic region: Secondary | ICD-10-CM | POA: Diagnosis not present

## 2016-06-16 DIAGNOSIS — M5416 Radiculopathy, lumbar region: Secondary | ICD-10-CM | POA: Diagnosis not present

## 2016-06-17 ENCOUNTER — Ambulatory Visit (INDEPENDENT_AMBULATORY_CARE_PROVIDER_SITE_OTHER): Payer: Medicare Other | Admitting: Physician Assistant

## 2016-06-17 ENCOUNTER — Encounter (INDEPENDENT_AMBULATORY_CARE_PROVIDER_SITE_OTHER): Payer: Self-pay | Admitting: Physician Assistant

## 2016-06-17 DIAGNOSIS — M5416 Radiculopathy, lumbar region: Secondary | ICD-10-CM | POA: Diagnosis not present

## 2016-06-17 DIAGNOSIS — M5442 Lumbago with sciatica, left side: Secondary | ICD-10-CM

## 2016-06-17 DIAGNOSIS — M9903 Segmental and somatic dysfunction of lumbar region: Secondary | ICD-10-CM | POA: Diagnosis not present

## 2016-06-17 DIAGNOSIS — M5136 Other intervertebral disc degeneration, lumbar region: Secondary | ICD-10-CM | POA: Diagnosis not present

## 2016-06-17 DIAGNOSIS — M9905 Segmental and somatic dysfunction of pelvic region: Secondary | ICD-10-CM | POA: Diagnosis not present

## 2016-06-17 MED ORDER — METHYLPREDNISOLONE 4 MG PO TABS: ORAL_TABLET | ORAL | 0 refills | Status: DC

## 2016-06-17 MED ORDER — METHOCARBAMOL 500 MG PO TABS: 500.0000 mg | ORAL_TABLET | Freq: Three times a day (TID) | ORAL | 0 refills | Status: DC | PRN

## 2016-06-17 NOTE — Progress Notes (Signed)
Office Visit Note   Patient: Rodney Gray           Date of Birth: 05-29-1950           MRN: [id number] Visit Date: 06/17/2016              Requested by: Venia Carbon, MD [place], [place] [id number] PCP: Viviana Simpler, MD   Assessment & Plan: Visit Diagnoses:  1. Acute midline low back pain with left-sided sciatica     Plan: We will place him on a Medrol Dosepak and Robaxin. He does not take NSAIDs. He can continue to use his Tylenol arthritis as needed. 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 If he is having pain still in the next 2 weeks he'll call our office will try to pain approval for an MRI of his lumbar spine rule out HNP and also for epidural steroid injection planning.  Follow-Up Instructions: Return in about 4 weeks (around 07/15/2016).   Orders:  No orders of the defined types were placed in this encounter.  No orders of the defined types were placed in this encounter.     Procedures: No procedures performed   Clinical Data: No additional findings.   Subjective: Chief Complaint  Patient presents with  . Lower Back - Pain    HPI Rodney Gray is a 66 year old male well-known Dr. [place] services not seen him in several years though. He comes in with a new complaint of low back pain that began a month ago. This began after shoveling some snow. He also has a history of injury to his back shoveling snow once again 25 years ago. Since seen a chiropractor is helping somebody still having pain worse with sitting. He's having no awaking pain he is having radicular symptoms with pain and tingling down the lateral aspect of left leg to the ankle. He's had no bowel or bladder dysfunction. He is nondiabetic. Radiographs brought him on a disc AP and lateral views of lumbar spine shows a slight retrolisthesis of L5 on S1. Disc spaces are well maintained throughout otherwise. Bridging osteophyte at L2-L3. L4-L5 and L5-S1 facet arthritic  changes. Normal  lordotic curvature is maintained. No acute fractures. Review of Systems Negative for fevers, chills, bowel changes, bladder change, chest pain, shortness breath, visual changes, dysphasia, orthopnea or waking pain. Positive for occasional acid reflux mid back pain and radicular symptoms down the left leg  Objective: Vital Signs: There were no vitals taken for this visit.  Physical Exam  Constitutional: He is oriented to person, place, and time. He appears well-developed and well-nourished. No distress.  Eyes: EOM are normal.  Cardiovascular: Intact distal pulses.   Calves are supple and nontender bilaterally  Pulmonary/Chest: Effort normal.  Neurological: He is alert and oriented to person, place, and time.  Skin: Skin is warm and dry.  Psychiatric: He has a normal mood and affect. His behavior is normal.    Ortho Exam Lower extremities he has 5 out of 5 strengths throughout the lower extremities against resistance. Tenderness over the left lower lumbar paraspinous region. Negative straight leg raise bilaterally. He is able to bend over and put his palms on the floor. He has limited extension of the lumbar spine. Is able to walk on his tiptoes and heels. Full sensation bilateral feet. Deep tendon reflexes are 2+ at the knees and 1+ at the ankles and equal and symmetric. Good range of motion of bilateral hips. No tenderness over  the trochanteric region of either hip. Specialty Comments:  No specialty comments available.  Imaging: No results found.   PMFS History: Patient Active Problem List   Diagnosis Date Noted  . Rectal fissure 05/21/2016  . Advance directive discussed with patient 09/12/2015  . Dizzy spells 04/30/2015  . Impaired fasting glucose 09/09/2014  . BPH (benign prostatic hypertrophy)   . Recurrent sinusitis 08/07/2012  . Allergic rhinitis due to pollen   . Routine general medical examination at a health care facility 08/19/2010  . Hyperlipemia  05/12/2009  . Atrial fibrillation ([place]) 05/12/2009  . OSTEOARTHRITIS 09/13/2006   Past Medical History:  Diagnosis Date  . Allergic rhinitis due to pollen   . Atrial fibrillation ([place])    A-Fib, 1987, 2003, 2005, EPS RX 2005 (A-Fib reocurred)  . BPH (benign prostatic hypertrophy)   . Cancer ([place])    skin CA- squamous and Basal cell  . Cataract   . Hyperlipemia    no per pt  . Hypertension    no per pt  . Osteoarthritis     Family History  Problem Relation Age of Onset  . Hypertension Father   . Cirrhosis Father 56  . Alcohol abuse Father   . Esophageal cancer Brother   . Coronary artery disease Neg Hx   . Diabetes Neg Hx   . Cancer Neg Hx     prostate or colon  . Colon cancer Neg Hx   . Rectal cancer Neg Hx   . Stomach cancer Neg Hx     Past Surgical History:  Procedure Laterality Date  . CAPSULOTOMY  08/2007   bilaterally  . CATARACT EXTRACTION     both eyes  . COLONOSCOPY    . MENISCUS REPAIR  6/11   left knee---Dr Blackmon  . NASAL SINUS SURGERY  May 2014  . RETINAL TEAR REPAIR CRYOTHERAPY     right 12/06  . RETINAL TEAR REPAIR CRYOTHERAPY     Retinal tear/bleed- Right 12/06  . SQUAMOUS CELL CARCINOMA EXCISION  07/2008   left forearm  . THUMB ARTHROSCOPY  1999   Social History   Occupational History  . retired Ship broker  Retired   Social History Main Topics  . Smoking status: Never Smoker  . Smokeless tobacco: Never Used  . Alcohol use Yes     Comment: occ.  . Drug use: Unknown  . Sexual activity: Not on file

## 2016-06-21 DIAGNOSIS — M9903 Segmental and somatic dysfunction of lumbar region: Secondary | ICD-10-CM | POA: Diagnosis not present

## 2016-06-21 DIAGNOSIS — M5136 Other intervertebral disc degeneration, lumbar region: Secondary | ICD-10-CM | POA: Diagnosis not present

## 2016-06-21 DIAGNOSIS — M9905 Segmental and somatic dysfunction of pelvic region: Secondary | ICD-10-CM | POA: Diagnosis not present

## 2016-06-21 DIAGNOSIS — M5416 Radiculopathy, lumbar region: Secondary | ICD-10-CM | POA: Diagnosis not present

## 2016-06-23 ENCOUNTER — Other Ambulatory Visit (INDEPENDENT_AMBULATORY_CARE_PROVIDER_SITE_OTHER): Payer: Self-pay

## 2016-06-23 DIAGNOSIS — M9905 Segmental and somatic dysfunction of pelvic region: Secondary | ICD-10-CM | POA: Diagnosis not present

## 2016-06-23 DIAGNOSIS — M9903 Segmental and somatic dysfunction of lumbar region: Secondary | ICD-10-CM | POA: Diagnosis not present

## 2016-06-23 DIAGNOSIS — M5416 Radiculopathy, lumbar region: Secondary | ICD-10-CM | POA: Diagnosis not present

## 2016-06-23 DIAGNOSIS — M5136 Other intervertebral disc degeneration, lumbar region: Secondary | ICD-10-CM | POA: Diagnosis not present

## 2016-06-23 MED ORDER — TIZANIDINE HCL 4 MG PO TABS: 4.0000 mg | ORAL_TABLET | Freq: Four times a day (QID) | ORAL | 0 refills | Status: DC | PRN

## 2016-06-23 NOTE — Progress Notes (Unsigned)
zanaflex

## 2016-06-24 DIAGNOSIS — M5136 Other intervertebral disc degeneration, lumbar region: Secondary | ICD-10-CM | POA: Diagnosis not present

## 2016-06-24 DIAGNOSIS — M9903 Segmental and somatic dysfunction of lumbar region: Secondary | ICD-10-CM | POA: Diagnosis not present

## 2016-06-24 DIAGNOSIS — M5416 Radiculopathy, lumbar region: Secondary | ICD-10-CM | POA: Diagnosis not present

## 2016-06-24 DIAGNOSIS — M9905 Segmental and somatic dysfunction of pelvic region: Secondary | ICD-10-CM | POA: Diagnosis not present

## 2016-06-28 DIAGNOSIS — M5416 Radiculopathy, lumbar region: Secondary | ICD-10-CM | POA: Diagnosis not present

## 2016-06-28 DIAGNOSIS — M9903 Segmental and somatic dysfunction of lumbar region: Secondary | ICD-10-CM | POA: Diagnosis not present

## 2016-06-28 DIAGNOSIS — M5136 Other intervertebral disc degeneration, lumbar region: Secondary | ICD-10-CM | POA: Diagnosis not present

## 2016-06-28 DIAGNOSIS — M9905 Segmental and somatic dysfunction of pelvic region: Secondary | ICD-10-CM | POA: Diagnosis not present

## 2016-06-29 DIAGNOSIS — M9903 Segmental and somatic dysfunction of lumbar region: Secondary | ICD-10-CM | POA: Diagnosis not present

## 2016-06-29 DIAGNOSIS — M5416 Radiculopathy, lumbar region: Secondary | ICD-10-CM | POA: Diagnosis not present

## 2016-06-29 DIAGNOSIS — M9905 Segmental and somatic dysfunction of pelvic region: Secondary | ICD-10-CM | POA: Diagnosis not present

## 2016-06-29 DIAGNOSIS — M5136 Other intervertebral disc degeneration, lumbar region: Secondary | ICD-10-CM | POA: Diagnosis not present

## 2016-07-15 DIAGNOSIS — M9905 Segmental and somatic dysfunction of pelvic region: Secondary | ICD-10-CM | POA: Diagnosis not present

## 2016-07-15 DIAGNOSIS — M9903 Segmental and somatic dysfunction of lumbar region: Secondary | ICD-10-CM | POA: Diagnosis not present

## 2016-07-15 DIAGNOSIS — M5136 Other intervertebral disc degeneration, lumbar region: Secondary | ICD-10-CM | POA: Diagnosis not present

## 2016-07-15 DIAGNOSIS — M5416 Radiculopathy, lumbar region: Secondary | ICD-10-CM | POA: Diagnosis not present

## 2016-07-19 ENCOUNTER — Ambulatory Visit (INDEPENDENT_AMBULATORY_CARE_PROVIDER_SITE_OTHER): Payer: Medicare Other | Admitting: Physician Assistant

## 2016-07-20 DIAGNOSIS — M5416 Radiculopathy, lumbar region: Secondary | ICD-10-CM | POA: Diagnosis not present

## 2016-07-20 DIAGNOSIS — M9905 Segmental and somatic dysfunction of pelvic region: Secondary | ICD-10-CM | POA: Diagnosis not present

## 2016-07-20 DIAGNOSIS — M9903 Segmental and somatic dysfunction of lumbar region: Secondary | ICD-10-CM | POA: Diagnosis not present

## 2016-07-20 DIAGNOSIS — M5136 Other intervertebral disc degeneration, lumbar region: Secondary | ICD-10-CM | POA: Diagnosis not present

## 2016-07-22 DIAGNOSIS — M5416 Radiculopathy, lumbar region: Secondary | ICD-10-CM | POA: Diagnosis not present

## 2016-07-22 DIAGNOSIS — M9905 Segmental and somatic dysfunction of pelvic region: Secondary | ICD-10-CM | POA: Diagnosis not present

## 2016-07-22 DIAGNOSIS — M5136 Other intervertebral disc degeneration, lumbar region: Secondary | ICD-10-CM | POA: Diagnosis not present

## 2016-07-22 DIAGNOSIS — M9903 Segmental and somatic dysfunction of lumbar region: Secondary | ICD-10-CM | POA: Diagnosis not present

## 2016-07-28 DIAGNOSIS — M5416 Radiculopathy, lumbar region: Secondary | ICD-10-CM | POA: Diagnosis not present

## 2016-07-28 DIAGNOSIS — M9903 Segmental and somatic dysfunction of lumbar region: Secondary | ICD-10-CM | POA: Diagnosis not present

## 2016-07-28 DIAGNOSIS — M9905 Segmental and somatic dysfunction of pelvic region: Secondary | ICD-10-CM | POA: Diagnosis not present

## 2016-07-28 DIAGNOSIS — M5136 Other intervertebral disc degeneration, lumbar region: Secondary | ICD-10-CM | POA: Diagnosis not present

## 2016-08-10 DIAGNOSIS — M5416 Radiculopathy, lumbar region: Secondary | ICD-10-CM | POA: Diagnosis not present

## 2016-08-10 DIAGNOSIS — M9903 Segmental and somatic dysfunction of lumbar region: Secondary | ICD-10-CM | POA: Diagnosis not present

## 2016-08-10 DIAGNOSIS — M9905 Segmental and somatic dysfunction of pelvic region: Secondary | ICD-10-CM | POA: Diagnosis not present

## 2016-08-10 DIAGNOSIS — M5136 Other intervertebral disc degeneration, lumbar region: Secondary | ICD-10-CM | POA: Diagnosis not present

## 2016-08-12 DIAGNOSIS — M5136 Other intervertebral disc degeneration, lumbar region: Secondary | ICD-10-CM | POA: Diagnosis not present

## 2016-08-12 DIAGNOSIS — M9903 Segmental and somatic dysfunction of lumbar region: Secondary | ICD-10-CM | POA: Diagnosis not present

## 2016-08-12 DIAGNOSIS — M5416 Radiculopathy, lumbar region: Secondary | ICD-10-CM | POA: Diagnosis not present

## 2016-08-12 DIAGNOSIS — M9905 Segmental and somatic dysfunction of pelvic region: Secondary | ICD-10-CM | POA: Diagnosis not present

## 2016-08-25 DIAGNOSIS — M9903 Segmental and somatic dysfunction of lumbar region: Secondary | ICD-10-CM | POA: Diagnosis not present

## 2016-08-25 DIAGNOSIS — M5136 Other intervertebral disc degeneration, lumbar region: Secondary | ICD-10-CM | POA: Diagnosis not present

## 2016-08-25 DIAGNOSIS — M5416 Radiculopathy, lumbar region: Secondary | ICD-10-CM | POA: Diagnosis not present

## 2016-08-25 DIAGNOSIS — M9905 Segmental and somatic dysfunction of pelvic region: Secondary | ICD-10-CM | POA: Diagnosis not present

## 2016-09-01 DIAGNOSIS — M5416 Radiculopathy, lumbar region: Secondary | ICD-10-CM | POA: Diagnosis not present

## 2016-09-01 DIAGNOSIS — M5136 Other intervertebral disc degeneration, lumbar region: Secondary | ICD-10-CM | POA: Diagnosis not present

## 2016-09-01 DIAGNOSIS — M9903 Segmental and somatic dysfunction of lumbar region: Secondary | ICD-10-CM | POA: Diagnosis not present

## 2016-09-01 DIAGNOSIS — M9905 Segmental and somatic dysfunction of pelvic region: Secondary | ICD-10-CM | POA: Diagnosis not present

## 2016-09-08 DIAGNOSIS — M9903 Segmental and somatic dysfunction of lumbar region: Secondary | ICD-10-CM | POA: Diagnosis not present

## 2016-09-08 DIAGNOSIS — M9905 Segmental and somatic dysfunction of pelvic region: Secondary | ICD-10-CM | POA: Diagnosis not present

## 2016-09-08 DIAGNOSIS — M5416 Radiculopathy, lumbar region: Secondary | ICD-10-CM | POA: Diagnosis not present

## 2016-09-08 DIAGNOSIS — M5136 Other intervertebral disc degeneration, lumbar region: Secondary | ICD-10-CM | POA: Diagnosis not present

## 2016-09-13 ENCOUNTER — Ambulatory Visit (INDEPENDENT_AMBULATORY_CARE_PROVIDER_SITE_OTHER): Payer: Medicare Other | Admitting: Internal Medicine

## 2016-09-13 ENCOUNTER — Encounter: Payer: Self-pay | Admitting: Internal Medicine

## 2016-09-13 VITALS — BP 118/80 | HR 61 | Temp 97.6°F | Ht 68.0 in | Wt 176.0 lb

## 2016-09-13 DIAGNOSIS — Z7189 Other specified counseling: Secondary | ICD-10-CM

## 2016-09-13 DIAGNOSIS — J301 Allergic rhinitis due to pollen: Secondary | ICD-10-CM

## 2016-09-13 DIAGNOSIS — N138 Other obstructive and reflux uropathy: Secondary | ICD-10-CM | POA: Diagnosis not present

## 2016-09-13 DIAGNOSIS — I482 Chronic atrial fibrillation, unspecified: Secondary | ICD-10-CM

## 2016-09-13 DIAGNOSIS — Z23 Encounter for immunization: Secondary | ICD-10-CM

## 2016-09-13 DIAGNOSIS — N401 Enlarged prostate with lower urinary tract symptoms: Secondary | ICD-10-CM

## 2016-09-13 DIAGNOSIS — Z0000 Encounter for general adult medical examination without abnormal findings: Secondary | ICD-10-CM

## 2016-09-13 LAB — COMPREHENSIVE METABOLIC PANEL
ALBUMIN: 4.3 g/dL (ref 3.5–5.2)
ALK PHOS: 47 U/L (ref 39–117)
ALT: 18 U/L (ref 0–53)
AST: 18 U/L (ref 0–37)
BUN: 16 mg/dL (ref 6–23)
CALCIUM: 9.4 mg/dL (ref 8.4–10.5)
CO2: 28 mEq/L (ref 19–32)
Chloride: 104 mEq/L (ref 96–112)
Creatinine, Ser: 0.79 mg/dL (ref 0.40–1.50)
GFR: 104.25 mL/min (ref >60.00)
GLUCOSE: 97 mg/dL (ref 70–99)
POTASSIUM: 4.2 meq/L (ref 3.5–5.1)
Sodium: 138 mEq/L (ref 135–145)
TOTAL PROTEIN: 6.7 g/dL (ref 6.0–8.3)
Total Bilirubin: 0.9 mg/dL (ref 0.2–1.2)

## 2016-09-13 LAB — LIPID PANEL
CHOLESTEROL: 182 mg/dL (ref 0–200)
HDL: 51 mg/dL (ref >39.00)
LDL Cholesterol: 114 mg/dL — ABNORMAL HIGH (ref 0–99)
NonHDL: 131.23
TRIGLYCERIDES: 85 mg/dL (ref 0.0–149.0)
Total CHOL/HDL Ratio: 4
VLDL: 17 mg/dL (ref 0.0–40.0)

## 2016-09-13 LAB — CBC WITH DIFFERENTIAL/PLATELET
Basophils Absolute: 0 10*3/uL (ref 0.0–0.1)
Basophils Relative: 0.9 % (ref 0.0–3.0)
EOS ABS: 0.1 10*3/uL (ref 0.0–0.7)
Eosinophils Relative: 1.6 % (ref 0.0–5.0)
HCT: 41.9 % (ref 39.0–52.0)
Hemoglobin: 14.2 g/dL (ref 13.0–17.0)
Lymphocytes Relative: 27.9 % (ref 12.0–46.0)
Lymphs Abs: 1.2 10*3/uL (ref 0.7–4.0)
MCHC: 33.9 g/dL (ref 30.0–36.0)
MCV: 94.6 fl (ref 78.0–100.0)
MONO ABS: 0.5 10*3/uL (ref 0.1–1.0)
Monocytes Relative: 10.9 % (ref 3.0–12.0)
Neutro Abs: 2.6 10*3/uL (ref 1.4–7.7)
Neutrophils Relative %: 58.7 % (ref 43.0–77.0)
Platelets: 222 10*3/uL (ref 150.0–400.0)
RBC: 4.43 Mil/uL (ref 4.22–5.81)
RDW: 13.5 % (ref 11.5–15.5)
WBC: 4.4 10*3/uL (ref 4.0–10.5)

## 2016-09-13 LAB — T4, FREE: FREE T4: 0.87 ng/dL (ref 0.60–1.60)

## 2016-09-13 NOTE — Assessment & Plan Note (Signed)
I have personally reviewed the Medicare Annual Wellness questionnaire and have noted 1. The patient's medical and social history 2. Their use of alcohol, tobacco or illicit drugs 3. Their current medications and supplements 4. The patient's functional ability including ADL's, fall risks, home safety risks and hearing or visual             impairment. 5. Diet and physical activities 6. Evidence for depression or mood disorders  The patients weight, height, BMI and visual acuity have been recorded in the chart I have made referrals, counseling and provided education to the patient based review of the above and I have provided the pt with a written personalized care plan for preventive services.  I have provided you with a copy of your personalized plan for preventive services. Please take the time to review along with your updated medication list.  Will update pneumovax Colon due 2026 Defer PSA to at least next year Keeping in shape

## 2016-09-13 NOTE — Progress Notes (Signed)
Subjective:    Patient ID: Rodney Gray, male    DOB: [birth year], 66 y.o.   MRN: [id number]  HPI Here for Medicare wellness and follow up of chronic health conditions Reviewed form and advanced directives  Reviewed other doctors Has rare drink of alcohol No tobacco Tries to exercise regularly Vision is fine Hearing is poor--getting assessment soon No falls No depression or anhedonia Independent with instrumental ADLs Mild memory issues---nothing worrisome  Heart is okay No palpitations Dr Lovena Le had been concerned about his BP--but seems to be better now Exercise tolerance is good No chest pain  No SOB No dizziness or sycnope No edema  Still has some urinary urgency Stream is good No incontinence Nocturia usually x 1 (at most)  Allergies are not that bad Rarely uses flonase Uses mask when doing grass--then showers  Current Outpatient Prescriptions on File Prior to Visit  Medication Sig Dispense Refill  . aspirin EC 81 MG tablet Take 1 tablet (81 mg total) by mouth daily. 90 tablet 3  . diltiazem (CARDIZEM CD) 180 MG 24 hr capsule TAKE 1 CAPSULE (180 MG TOTAL) BY MOUTH DAILY. 90 capsule 3  . fluticasone (FLONASE) 50 MCG/ACT nasal spray Place 2 sprays into both nostrils daily as needed for allergies or rhinitis.    . Multiple Vitamins-Minerals (MENS MULTIVITAMIN PLUS PO) Take 1 capsule by mouth daily.     . Omega-3 Fatty Acids (FISH OIL) 1200 MG CAPS Take 1 capsule by mouth daily.     No current facility-administered medications on file prior to visit.     Allergies  Allergen Reactions  . Ampicillin     REACTION: hives Has tolerated cephalosporins    Past Medical History:  Diagnosis Date  . Allergic rhinitis due to pollen   . Atrial fibrillation ([place])    A-Fib, 1987, 2003, 2005, EPS RX 2005 (A-Fib reocurred)  . BPH (benign prostatic hypertrophy)   . Cancer ([place])    skin CA- squamous and Basal cell  . Cataract   . Hyperlipemia    no per pt  .  Hypertension    no per pt  . Osteoarthritis     Past Surgical History:  Procedure Laterality Date  . CAPSULOTOMY  08/2007   bilaterally  . CATARACT EXTRACTION     both eyes  . COLONOSCOPY    . MENISCUS REPAIR  6/11   left knee---Dr Blackmon  . NASAL SINUS SURGERY  May 2014  . RETINAL TEAR REPAIR CRYOTHERAPY     right 12/06  . RETINAL TEAR REPAIR CRYOTHERAPY     Retinal tear/bleed- Right 12/06  . SQUAMOUS CELL CARCINOMA EXCISION  07/2008   left forearm  . THUMB ARTHROSCOPY  1999    Family History  Problem Relation Age of Onset  . Hypertension Father   . Cirrhosis Father 1  . Alcohol abuse Father   . Esophageal cancer Brother   . Coronary artery disease Neg Hx   . Diabetes Neg Hx   . Cancer Neg Hx        prostate or colon  . Colon cancer Neg Hx   . Rectal cancer Neg Hx   . Stomach cancer Neg Hx     Social History   Social History  . Marital status: Married    Spouse name: N/A  . Number of children: N/A  . Years of education: N/A   Occupational History  . retired Ship broker  Retired   Social History Main Topics  .  Smoking status: Never Smoker  . Smokeless tobacco: Never Used  . Alcohol use Yes     Comment: occ.  . Drug use: Unknown  . Sexual activity: Not on file   Other Topics Concern  . Not on file   Social History Narrative   No living will--plans to do this.   Wife should make health care decisions for him.   Would accept resuscitation attempts   Would accept tube feeds for some time--depending on prognosis   Review of Systems Rectal fissure is better. Improved with OTC cream--never filled the Rx Bowels have been okay--no blood Occasional back or joint pain--if he overdoes it in the gym Had back pain with left sciatica also--- then to chiropractor. Better after 2 weeks of manipulation. Still keeps up with Dr Joyce Copa. Did have check with orthopedist--- no action needed Sleep is still "broken"--- but only occasional daytime  tiredness (envigorated by the gym) Appetite is good Weight is stable Wears seat belt Teeth are fine---keeps up with dentist (Dr Earlie Lou) Rare heartburn---occ dysphagia (needs water with dry stuff like cereal) Bothered by lesion on back--wife notes scab (due for derm visit)     Objective:   Physical Exam  Constitutional: He is oriented to person, place, and time. He appears well-developed. No distress.  HENT:  Mouth/Throat: Oropharynx is clear and moist. No oropharyngeal exudate.  Neck: No thyromegaly present.  Cardiovascular: Normal rate, normal heart sounds and intact distal pulses.  Exam reveals no gallop.   No murmur heard. Slightly irregular  Pulmonary/Chest: Effort normal and breath sounds normal. No respiratory distress. He has no wheezes. He has no rales.  Abdominal: Soft. There is no tenderness.  Musculoskeletal: He exhibits no edema or tenderness.  Lymphadenopathy:    He has no cervical adenopathy.  Neurological: He is alert and oriented to person, place, and time.  President--- "Doyle Askew Obama, Bush" [phone] D-[id number] Recall 3/3  Skin: No rash noted. No erythema.  slight irritated papule on back          Assessment & Plan:

## 2016-09-13 NOTE — Assessment & Plan Note (Signed)
Still with no official documents See social history

## 2016-09-13 NOTE — Assessment & Plan Note (Signed)
Mild No regular meds

## 2016-09-13 NOTE — Assessment & Plan Note (Signed)
Mild symptoms only No meds needed

## 2016-09-13 NOTE — Addendum Note (Signed)
Addended by: Pilar Grammes on: 09/13/2016 11:31 AM   Modules accepted: Orders

## 2016-09-13 NOTE — Assessment & Plan Note (Signed)
Rate control is good Just ASA for now

## 2016-09-16 DIAGNOSIS — M5136 Other intervertebral disc degeneration, lumbar region: Secondary | ICD-10-CM | POA: Diagnosis not present

## 2016-09-16 DIAGNOSIS — M9905 Segmental and somatic dysfunction of pelvic region: Secondary | ICD-10-CM | POA: Diagnosis not present

## 2016-09-16 DIAGNOSIS — M5416 Radiculopathy, lumbar region: Secondary | ICD-10-CM | POA: Diagnosis not present

## 2016-09-16 DIAGNOSIS — M9903 Segmental and somatic dysfunction of lumbar region: Secondary | ICD-10-CM | POA: Diagnosis not present

## 2016-10-07 DIAGNOSIS — M5136 Other intervertebral disc degeneration, lumbar region: Secondary | ICD-10-CM | POA: Diagnosis not present

## 2016-10-07 DIAGNOSIS — M9905 Segmental and somatic dysfunction of pelvic region: Secondary | ICD-10-CM | POA: Diagnosis not present

## 2016-10-07 DIAGNOSIS — M9903 Segmental and somatic dysfunction of lumbar region: Secondary | ICD-10-CM | POA: Diagnosis not present

## 2016-10-07 DIAGNOSIS — M5416 Radiculopathy, lumbar region: Secondary | ICD-10-CM | POA: Diagnosis not present

## 2016-10-13 DIAGNOSIS — M9903 Segmental and somatic dysfunction of lumbar region: Secondary | ICD-10-CM | POA: Diagnosis not present

## 2016-10-13 DIAGNOSIS — M9905 Segmental and somatic dysfunction of pelvic region: Secondary | ICD-10-CM | POA: Diagnosis not present

## 2016-10-13 DIAGNOSIS — M5136 Other intervertebral disc degeneration, lumbar region: Secondary | ICD-10-CM | POA: Diagnosis not present

## 2016-10-13 DIAGNOSIS — M5416 Radiculopathy, lumbar region: Secondary | ICD-10-CM | POA: Diagnosis not present

## 2016-10-27 DIAGNOSIS — M5416 Radiculopathy, lumbar region: Secondary | ICD-10-CM | POA: Diagnosis not present

## 2016-10-27 DIAGNOSIS — M5136 Other intervertebral disc degeneration, lumbar region: Secondary | ICD-10-CM | POA: Diagnosis not present

## 2016-10-27 DIAGNOSIS — M9903 Segmental and somatic dysfunction of lumbar region: Secondary | ICD-10-CM | POA: Diagnosis not present

## 2016-10-27 DIAGNOSIS — M9905 Segmental and somatic dysfunction of pelvic region: Secondary | ICD-10-CM | POA: Diagnosis not present

## 2016-11-03 DIAGNOSIS — M5136 Other intervertebral disc degeneration, lumbar region: Secondary | ICD-10-CM | POA: Diagnosis not present

## 2016-11-03 DIAGNOSIS — M9902 Segmental and somatic dysfunction of thoracic region: Secondary | ICD-10-CM | POA: Diagnosis not present

## 2016-11-03 DIAGNOSIS — M62830 Muscle spasm of back: Secondary | ICD-10-CM | POA: Diagnosis not present

## 2016-11-03 DIAGNOSIS — M9903 Segmental and somatic dysfunction of lumbar region: Secondary | ICD-10-CM | POA: Diagnosis not present

## 2016-11-25 DIAGNOSIS — M5136 Other intervertebral disc degeneration, lumbar region: Secondary | ICD-10-CM | POA: Diagnosis not present

## 2016-11-25 DIAGNOSIS — M62830 Muscle spasm of back: Secondary | ICD-10-CM | POA: Diagnosis not present

## 2016-11-25 DIAGNOSIS — M9902 Segmental and somatic dysfunction of thoracic region: Secondary | ICD-10-CM | POA: Diagnosis not present

## 2016-11-25 DIAGNOSIS — M9903 Segmental and somatic dysfunction of lumbar region: Secondary | ICD-10-CM | POA: Diagnosis not present

## 2016-12-23 DIAGNOSIS — M9902 Segmental and somatic dysfunction of thoracic region: Secondary | ICD-10-CM | POA: Diagnosis not present

## 2016-12-23 DIAGNOSIS — M9903 Segmental and somatic dysfunction of lumbar region: Secondary | ICD-10-CM | POA: Diagnosis not present

## 2016-12-23 DIAGNOSIS — M5136 Other intervertebral disc degeneration, lumbar region: Secondary | ICD-10-CM | POA: Diagnosis not present

## 2016-12-23 DIAGNOSIS — M62830 Muscle spasm of back: Secondary | ICD-10-CM | POA: Diagnosis not present

## 2016-12-24 ENCOUNTER — Ambulatory Visit (INDEPENDENT_AMBULATORY_CARE_PROVIDER_SITE_OTHER): Payer: Medicare Other

## 2016-12-24 DIAGNOSIS — Z23 Encounter for immunization: Secondary | ICD-10-CM

## 2017-01-18 DIAGNOSIS — M9903 Segmental and somatic dysfunction of lumbar region: Secondary | ICD-10-CM | POA: Diagnosis not present

## 2017-01-18 DIAGNOSIS — M9902 Segmental and somatic dysfunction of thoracic region: Secondary | ICD-10-CM | POA: Diagnosis not present

## 2017-01-18 DIAGNOSIS — M5136 Other intervertebral disc degeneration, lumbar region: Secondary | ICD-10-CM | POA: Diagnosis not present

## 2017-01-18 DIAGNOSIS — M62830 Muscle spasm of back: Secondary | ICD-10-CM | POA: Diagnosis not present

## 2017-02-04 ENCOUNTER — Other Ambulatory Visit: Payer: Self-pay | Admitting: Internal Medicine

## 2017-02-09 DIAGNOSIS — Z9842 Cataract extraction status, left eye: Secondary | ICD-10-CM | POA: Diagnosis not present

## 2017-02-09 DIAGNOSIS — H33331 Multiple defects of retina without detachment, right eye: Secondary | ICD-10-CM | POA: Diagnosis not present

## 2017-02-09 DIAGNOSIS — Z9841 Cataract extraction status, right eye: Secondary | ICD-10-CM | POA: Diagnosis not present

## 2017-02-09 DIAGNOSIS — H59811 Chorioretinal scars after surgery for detachment, right eye: Secondary | ICD-10-CM | POA: Diagnosis not present

## 2017-02-16 DIAGNOSIS — M9903 Segmental and somatic dysfunction of lumbar region: Secondary | ICD-10-CM | POA: Diagnosis not present

## 2017-02-16 DIAGNOSIS — M9902 Segmental and somatic dysfunction of thoracic region: Secondary | ICD-10-CM | POA: Diagnosis not present

## 2017-02-16 DIAGNOSIS — M5136 Other intervertebral disc degeneration, lumbar region: Secondary | ICD-10-CM | POA: Diagnosis not present

## 2017-02-16 DIAGNOSIS — M62830 Muscle spasm of back: Secondary | ICD-10-CM | POA: Diagnosis not present

## 2017-03-08 DIAGNOSIS — Z872 Personal history of diseases of the skin and subcutaneous tissue: Secondary | ICD-10-CM | POA: Diagnosis not present

## 2017-03-08 DIAGNOSIS — Z859 Personal history of malignant neoplasm, unspecified: Secondary | ICD-10-CM | POA: Diagnosis not present

## 2017-03-08 DIAGNOSIS — L570 Actinic keratosis: Secondary | ICD-10-CM | POA: Diagnosis not present

## 2017-03-08 DIAGNOSIS — Z85828 Personal history of other malignant neoplasm of skin: Secondary | ICD-10-CM | POA: Diagnosis not present

## 2017-03-23 DIAGNOSIS — M9903 Segmental and somatic dysfunction of lumbar region: Secondary | ICD-10-CM | POA: Diagnosis not present

## 2017-03-23 DIAGNOSIS — M5136 Other intervertebral disc degeneration, lumbar region: Secondary | ICD-10-CM | POA: Diagnosis not present

## 2017-03-23 DIAGNOSIS — M62830 Muscle spasm of back: Secondary | ICD-10-CM | POA: Diagnosis not present

## 2017-03-23 DIAGNOSIS — M9902 Segmental and somatic dysfunction of thoracic region: Secondary | ICD-10-CM | POA: Diagnosis not present

## 2017-04-20 DIAGNOSIS — M5136 Other intervertebral disc degeneration, lumbar region: Secondary | ICD-10-CM | POA: Diagnosis not present

## 2017-04-20 DIAGNOSIS — M9902 Segmental and somatic dysfunction of thoracic region: Secondary | ICD-10-CM | POA: Diagnosis not present

## 2017-04-20 DIAGNOSIS — M9903 Segmental and somatic dysfunction of lumbar region: Secondary | ICD-10-CM | POA: Diagnosis not present

## 2017-04-20 DIAGNOSIS — M62830 Muscle spasm of back: Secondary | ICD-10-CM | POA: Diagnosis not present

## 2017-05-03 ENCOUNTER — Ambulatory Visit (INDEPENDENT_AMBULATORY_CARE_PROVIDER_SITE_OTHER): Payer: Medicare Other | Admitting: Internal Medicine

## 2017-05-03 ENCOUNTER — Encounter: Payer: Self-pay | Admitting: Internal Medicine

## 2017-05-03 VITALS — BP 130/72 | HR 65 | Ht 68.0 in | Wt 184.0 lb

## 2017-05-03 DIAGNOSIS — I482 Chronic atrial fibrillation, unspecified: Secondary | ICD-10-CM

## 2017-05-03 NOTE — Progress Notes (Signed)
HPI Rodney Gray returns today for ongoing evaluation of atrial fibrillation. He is a pleasant 67 yo man with no other medical problems. He uses a calcium channel blocker for rate control. He remains active exercising 3-4 times a week and is also active. He denies chest pain or sob. No edema.  Allergies  Allergen Reactions  . Ampicillin     REACTION: hives Has tolerated cephalosporins     Current Outpatient Medications  Medication Sig Dispense Refill  . aspirin EC 81 MG tablet Take 1 tablet (81 mg total) by mouth daily. 90 tablet 3  . diltiazem (CARDIZEM CD) 180 MG 24 hr capsule TAKE 1 CAPSULE (180 MG TOTAL) BY MOUTH DAILY. 90 capsule 3  . fluticasone (FLONASE) 50 MCG/ACT nasal spray Place 2 sprays into both nostrils daily as needed for allergies or rhinitis.    . Multiple Vitamins-Minerals (MENS MULTIVITAMIN PLUS PO) Take 1 capsule by mouth daily.     . Omega-3 Fatty Acids (FISH OIL) 1200 MG CAPS Take 1 capsule by mouth daily.     No current facility-administered medications for this visit.      Past Medical History:  Diagnosis Date  . Allergic rhinitis due to pollen   . Atrial fibrillation ([place])    A-Fib, 1987, 2003, 2005, EPS RX 2005 (A-Fib reocurred)  . BPH (benign prostatic hypertrophy)   . Cancer ([place])    skin CA- squamous and Basal cell  . Cataract   . Hyperlipemia    no per pt  . Hypertension    no per pt  . Osteoarthritis     ROS:   All systems reviewed and negative except as noted in the HPI.   Past Surgical History:  Procedure Laterality Date  . CAPSULOTOMY  08/2007   bilaterally  . CATARACT EXTRACTION     both eyes  . COLONOSCOPY    . MENISCUS REPAIR  6/11   left knee---Dr Blackmon  . NASAL SINUS SURGERY  May 2014  . RETINAL TEAR REPAIR CRYOTHERAPY     right 12/06  . RETINAL TEAR REPAIR CRYOTHERAPY     Retinal tear/bleed- Right 12/06  . SQUAMOUS CELL CARCINOMA EXCISION  07/2008   left forearm  . THUMB ARTHROSCOPY  1999     Family  History  Problem Relation Age of Onset  . Hypertension Father   . Cirrhosis Father 59  . Alcohol abuse Father   . Esophageal cancer Brother   . Coronary artery disease Neg Hx   . Diabetes Neg Hx   . Cancer Neg Hx        prostate or colon  . Colon cancer Neg Hx   . Rectal cancer Neg Hx   . Stomach cancer Neg Hx      Social History   Socioeconomic History  . Marital status: Married    Spouse name: Not on file  . Number of children: Not on file  . Years of education: Not on file  . Highest education level: Not on file  Social Needs  . Financial resource strain: Not on file  . Food insecurity - worry: Not on file  . Food insecurity - inability: Not on file  . Transportation needs - medical: Not on file  . Transportation needs - non-medical: Not on file  Occupational History  . Occupation: retired Primary school teacher: RETIRED  Tobacco Use  . Smoking status: Never Smoker  . Smokeless tobacco: Never Used  Substance and  Sexual Activity  . Alcohol use: Yes    Comment: occ.  . Drug use: Not on file  . Sexual activity: Not on file  Other Topics Concern  . Not on file  Social History Narrative   No living will--plans to do this.   Wife should make health care decisions for him.   Would accept resuscitation attempts   Would accept tube feeds for some time--depending on prognosis     BP 130/72   Pulse 65   Ht 5\' 8"  (1.727 m)   Wt 184 lb (83.5 kg)   BMI 27.98 kg/m   Physical Exam:  Well appearing NAD HEENT: Unremarkable Neck:  No JVD, no thyromegally Lymphatics:  No adenopathy Back:  No CVA tenderness Lungs:  Clear with no wheezes HEART:  IRegular rate rhythm, no murmurs, no rubs, no clicks Abd:  soft, positive bowel sounds, no organomegally, no rebound, no guarding Ext:  2 plus pulses, no edema, no cyanosis, no clubbing Skin:  No rashes no nodules Neuro:  CN II through XII intact, motor grossly intact  EKG - atrial fibrillation with a  controlled VR  Assess/Plan: 1. Atrial fib - his symptoms are well controlled. He will continue his current meds. Because he has no evidence of vascular disease and is normotensive, he will not require systemic anti-coagulation until he reaches 65 or until/if he develops HTN or some other stroke risk factor.  Mikle Bosworth.D.

## 2017-05-03 NOTE — Patient Instructions (Signed)

## 2017-05-09 DIAGNOSIS — H903 Sensorineural hearing loss, bilateral: Secondary | ICD-10-CM | POA: Diagnosis not present

## 2017-05-10 ENCOUNTER — Other Ambulatory Visit: Payer: Self-pay | Admitting: Otolaryngology

## 2017-05-10 DIAGNOSIS — H903 Sensorineural hearing loss, bilateral: Secondary | ICD-10-CM | POA: Diagnosis not present

## 2017-05-10 DIAGNOSIS — H9042 Sensorineural hearing loss, unilateral, left ear, with unrestricted hearing on the contralateral side: Secondary | ICD-10-CM

## 2017-05-10 DIAGNOSIS — H90A22 Sensorineural hearing loss, unilateral, left ear, with restricted hearing on the contralateral side: Secondary | ICD-10-CM | POA: Diagnosis not present

## 2017-05-10 DIAGNOSIS — IMO0001 Reserved for inherently not codable concepts without codable children: Secondary | ICD-10-CM

## 2017-05-13 ENCOUNTER — Other Ambulatory Visit: Payer: Self-pay | Admitting: Internal Medicine

## 2017-05-17 ENCOUNTER — Ambulatory Visit
Admission: RE | Admit: 2017-05-17 | Discharge: 2017-05-17 | Disposition: A | Discharge status: Home / Self Care | Payer: Medicare Other | Source: Ambulatory Visit | Attending: Otolaryngology | Admitting: Otolaryngology

## 2017-05-17 DIAGNOSIS — H9042 Sensorineural hearing loss, unilateral, left ear, with unrestricted hearing on the contralateral side: Secondary | ICD-10-CM | POA: Insufficient documentation

## 2017-05-17 DIAGNOSIS — IMO0001 Reserved for inherently not codable concepts without codable children: Secondary | ICD-10-CM

## 2017-05-17 DIAGNOSIS — H905 Unspecified sensorineural hearing loss: Secondary | ICD-10-CM | POA: Diagnosis not present

## 2017-05-17 LAB — POCT I-STAT CREATININE: Creatinine, Ser: 0.9 mg/dL (ref 0.61–1.24)

## 2017-05-17 MED ORDER — GADOBENATE DIMEGLUMINE 529 MG/ML IV SOLN
17.0000 mL | Freq: Once | INTRAVENOUS | Status: AC | PRN
  Administered 2017-05-17: 17 mL via INTRAVENOUS

## 2017-05-18 DIAGNOSIS — M5136 Other intervertebral disc degeneration, lumbar region: Secondary | ICD-10-CM | POA: Diagnosis not present

## 2017-05-18 DIAGNOSIS — M9903 Segmental and somatic dysfunction of lumbar region: Secondary | ICD-10-CM | POA: Diagnosis not present

## 2017-05-18 DIAGNOSIS — M62830 Muscle spasm of back: Secondary | ICD-10-CM | POA: Diagnosis not present

## 2017-05-18 DIAGNOSIS — M9902 Segmental and somatic dysfunction of thoracic region: Secondary | ICD-10-CM | POA: Diagnosis not present

## 2017-05-19 DIAGNOSIS — H90A22 Sensorineural hearing loss, unilateral, left ear, with restricted hearing on the contralateral side: Secondary | ICD-10-CM | POA: Diagnosis not present

## 2017-06-17 ENCOUNTER — Ambulatory Visit (INDEPENDENT_AMBULATORY_CARE_PROVIDER_SITE_OTHER): Payer: Medicare Other | Admitting: Internal Medicine

## 2017-06-17 ENCOUNTER — Encounter: Payer: Self-pay | Admitting: Internal Medicine

## 2017-06-17 ENCOUNTER — Ambulatory Visit (INDEPENDENT_AMBULATORY_CARE_PROVIDER_SITE_OTHER)
Admission: RE | Admit: 2017-06-17 | Discharge: 2017-06-17 | Disposition: A | Discharge status: Home / Self Care | Payer: Medicare Other | Source: Ambulatory Visit | Attending: Internal Medicine | Admitting: Internal Medicine

## 2017-06-17 VITALS — BP 118/84 | HR 57 | Temp 97.4°F | Wt 183.0 lb

## 2017-06-17 DIAGNOSIS — R079 Chest pain, unspecified: Secondary | ICD-10-CM

## 2017-06-17 NOTE — Progress Notes (Signed)
Subjective:    Patient ID: Rodney Gray, male    DOB: 23-Jul-1950, 67 y.o.   MRN: [id number]  HPI Here due to rib pain  Has noticed some pain on his left flank---seems to be gas ?after eating Steady for 3 days last August----and only once in a while since then  Woke up 4 days ago with low left ribs hurting Did fall when working on a glider chair--- and fell onto the point of one of the legs Bruised at low ribs laterally on left This happened 2 weeks ago  Current Outpatient Medications on File Prior to Visit  Medication Sig Dispense Refill  . aspirin EC 81 MG tablet Take 1 tablet (81 mg total) by mouth daily. 90 tablet 3  . diltiazem (CARDIZEM CD) 180 MG 24 hr capsule TAKE 1 CAPSULE (180 MG TOTAL) BY MOUTH DAILY. 90 capsule 3  . fluticasone (FLONASE) 50 MCG/ACT nasal spray Place 2 sprays into both nostrils daily as needed for allergies or rhinitis.    . Multiple Vitamins-Minerals (MENS MULTIVITAMIN PLUS PO) Take 1 capsule by mouth daily.     . Omega-3 Fatty Acids (FISH OIL) 1200 MG CAPS Take 1 capsule by mouth daily.     No current facility-administered medications on file prior to visit.     Allergies  Allergen Reactions  . Ampicillin     REACTION: hives Has tolerated cephalosporins    Past Medical History:  Diagnosis Date  . Allergic rhinitis due to pollen   . Atrial fibrillation ([place])    A-Fib, 1987, 2003, 2005, EPS RX 2005 (A-Fib reocurred)  . BPH (benign prostatic hypertrophy)   . Cancer ([place])    skin CA- squamous and Basal cell  . Cataract   . Hyperlipemia    no per pt  . Hypertension    no per pt  . Osteoarthritis     Past Surgical History:  Procedure Laterality Date  . CAPSULOTOMY  08/2007   bilaterally  . CATARACT EXTRACTION     both eyes  . COLONOSCOPY    . MENISCUS REPAIR  6/11   left knee---Dr Blackmon  . NASAL SINUS SURGERY  May 2014  . RETINAL TEAR REPAIR CRYOTHERAPY     right 12/06  . RETINAL TEAR REPAIR CRYOTHERAPY     Retinal  tear/bleed- Right 12/06  . SQUAMOUS CELL CARCINOMA EXCISION  07/2008   left forearm  . THUMB ARTHROSCOPY  1999    Family History  Problem Relation Age of Onset  . Hypertension Father   . Cirrhosis Father 53  . Alcohol abuse Father   . Esophageal cancer Brother   . Coronary artery disease Neg Hx   . Diabetes Neg Hx   . Cancer Neg Hx        prostate or colon  . Colon cancer Neg Hx   . Rectal cancer Neg Hx   . Stomach cancer Neg Hx     Social History   Socioeconomic History  . Marital status: Married    Spouse name: Not on file  . Number of children: Not on file  . Years of education: Not on file  . Highest education level: Not on file  Social Needs  . Financial resource strain: Not on file  . Food insecurity - worry: Not on file  . Food insecurity - inability: Not on file  . Transportation needs - medical: Not on file  . Transportation needs - non-medical: Not on file  Occupational History  . Occupation:  retired Primary school teacher: RETIRED  Tobacco Use  . Smoking status: Never Smoker  . Smokeless tobacco: Never Used  Substance and Sexual Activity  . Alcohol use: Yes    Comment: occ.  . Drug use: Not on file  . Sexual activity: Not on file  Other Topics Concern  . Not on file  Social History Narrative   No living will--plans to do this.   Wife should make health care decisions for him.   Would accept resuscitation attempts   Would accept tube feeds for some time--depending on prognosis   Review of Systems Also pulled groin 2 weeks ago with the fall--pain on left No cough No fever No SOB Still able to go to gym---doing bike for now    Objective:   Physical Exam  Pulmonary/Chest: Effort normal and breath sounds normal. No respiratory distress. He has no wheezes. He has no rales.  Tenderness ~ left T10-11 along lateral side  Abdominal: Soft. He exhibits no distension. There is no tenderness. There is no rebound and no guarding.  No HSM    Musculoskeletal:  Normal internal rotation of left hip          Assessment & Plan:

## 2017-06-17 NOTE — Assessment & Plan Note (Signed)
Seems to be rib related He hit himself lower with the fall---so no clear explanation there Has had intermittent problems there since August----still seems to be musculoskeletal as opposed to any other alternatives (like atypical gallbladder). Reassured--doesn't seem to be something serious Will check CXR to be sure no rib lesions or parenchymal lesions

## 2017-06-22 DIAGNOSIS — M9903 Segmental and somatic dysfunction of lumbar region: Secondary | ICD-10-CM | POA: Diagnosis not present

## 2017-06-22 DIAGNOSIS — M5136 Other intervertebral disc degeneration, lumbar region: Secondary | ICD-10-CM | POA: Diagnosis not present

## 2017-06-22 DIAGNOSIS — M9905 Segmental and somatic dysfunction of pelvic region: Secondary | ICD-10-CM | POA: Diagnosis not present

## 2017-06-22 DIAGNOSIS — M955 Acquired deformity of pelvis: Secondary | ICD-10-CM | POA: Diagnosis not present

## 2017-07-18 ENCOUNTER — Ambulatory Visit (INDEPENDENT_AMBULATORY_CARE_PROVIDER_SITE_OTHER): Payer: Medicare Other

## 2017-07-18 ENCOUNTER — Ambulatory Visit (INDEPENDENT_AMBULATORY_CARE_PROVIDER_SITE_OTHER): Payer: Medicare Other | Admitting: Physician Assistant

## 2017-07-18 ENCOUNTER — Encounter (INDEPENDENT_AMBULATORY_CARE_PROVIDER_SITE_OTHER): Payer: Self-pay | Admitting: Physician Assistant

## 2017-07-18 ENCOUNTER — Other Ambulatory Visit (INDEPENDENT_AMBULATORY_CARE_PROVIDER_SITE_OTHER): Payer: Self-pay

## 2017-07-18 VITALS — Ht 68.0 in | Wt 183.0 lb

## 2017-07-18 DIAGNOSIS — M5442 Lumbago with sciatica, left side: Secondary | ICD-10-CM

## 2017-07-18 DIAGNOSIS — G8929 Other chronic pain: Secondary | ICD-10-CM

## 2017-07-18 DIAGNOSIS — M25552 Pain in left hip: Secondary | ICD-10-CM

## 2017-07-18 NOTE — Progress Notes (Signed)
Office Visit Note   Patient: Rodney Gray           Date of Birth: [birth year]           MRN: [id number] Visit Date: 07/18/2017              Requested by: Venia Carbon, MD Rodney Gray, Rodney Gray [id number] PCP: Venia Carbon, MD   Assessment & Plan: Visit Diagnoses:  1. Pain in left hip   2. Chronic left-sided low back pain with left-sided sciatica     Plan: Due to his continued left hip pain which is becoming more painful over the last few weeks recommend MRI with arthrogram rule out labral tear.  Have him follow-up after the MRI to go over results and discuss further treatment.  Follow-Up Instructions: No follow-ups on file.   Orders:  Orders Placed This Encounter  Procedures  . XR HIP UNILAT W OR W/O PELVIS 2-3 VIEWS LEFT  . XR Lumbar Spine 2-3 Views   No orders of the defined types were placed in this encounter.     Procedures: No procedures performed   Clinical Data: No additional findings.   Subjective: Chief Complaint  Patient presents with  . Left Hip - Pain    S/p fall over a chair mid-March 2019  . Lower Back - Pain    HPI Rodney Gray comes in today due to left buttocks and left groin pain.  Was seen over a year ago due to some back pain.  He has seen a chiropractor for this issue and had one adjustment.  He reports in mid March that he had a fall over gliding chair.  He is unsure exactly what happened with his hip but he is having difficulty lifting his left leg now.  He has pain in the groin area with motion and also in the buttocks area with motion of the hip.  He does have some pain in the left lower leg but no numbness or tingling down the leg.  States the pain in the groin area is getting worse and is having trouble sleeping.  He is taking Tylenol which helps takes the edge off of the pain.  Having no significant back pain.  States he has sleep with a pillow between his knees take pressure off of his left hip.  He has  difficulty getting in and out of the car as a passenger. Review of Systems Positive for chills.  No chest pain shortness of breath.  Please see HPI otherwise negative  Objective: Vital Signs: Ht 5\' 8"  (1.727 m)   Wt 183 lb (83 kg)   BMI 27.83 kg/m   Physical Exam  Constitutional: He is oriented to person, place, and time. He appears well-developed and well-nourished. No distress.  Pulmonary/Chest: Effort normal.  Neurological: He is alert and oriented to person, place, and time.  Skin: He is not diaphoretic.  Psychiatric: He has a normal mood and affect.    Ortho Exam Lower extremities 5 out of 5 strength throughout negative straight leg raise bilaterally.  He is nontender over the lumbar spine.  He has tenderness over the left initial tuberosity region left greater trochanteric region.  Internal/external rotation causes the hip pain.  Overall good range of motion.  Gentle log roll causes pain in the left hip no pain on the right.  He ambulates with a nonantalgic gait without assistive device. Specialty Comments:  No specialty comments available.  Imaging: Xr  Hip Unilat W Or W/o Pelvis 2-3 Views Left  Result Date: 07/18/2017 AP pelvis lateral view left hip: Hips are well located.  Hip joints are well maintained.  No acute fractures or bony abnormalities.  Xr Lumbar Spine 2-3 Views  Result Date: 07/18/2017 Lumbar spine 2 views: No acute fractures.  Lower thoracic and upper lumbar endplate spurring.  Normal lordotic curvature.  Disc spaces are well maintained.    PMFS History: Patient Active Problem List   Diagnosis Date Noted  . Left sided chest pain 06/17/2017  . Advance directive discussed with patient 09/12/2015  . BPH with obstruction/lower urinary tract symptoms   . Allergic rhinitis due to pollen   . Routine general medical examination at a health care facility 08/19/2010  . Hyperlipemia 05/12/2009  . Atrial fibrillation ([place]) 05/12/2009  . OSTEOARTHRITIS 09/13/2006     Past Medical History:  Diagnosis Date  . Allergic rhinitis due to pollen   . Atrial fibrillation ([place])    A-Fib, 1987, 2003, 2005, EPS RX 2005 (A-Fib reocurred)  . BPH (benign prostatic hypertrophy)   . Cancer ([place])    skin CA- squamous and Basal cell  . Cataract   . Hyperlipemia    no per pt  . Hypertension    no per pt  . Osteoarthritis     Family History  Problem Relation Age of Onset  . Hypertension Father   . Cirrhosis Father 7  . Alcohol abuse Father   . Esophageal cancer Brother   . Coronary artery disease Neg Hx   . Diabetes Neg Hx   . Cancer Neg Hx        prostate or colon  . Colon cancer Neg Hx   . Rectal cancer Neg Hx   . Stomach cancer Neg Hx     Past Surgical History:  Procedure Laterality Date  . CAPSULOTOMY  08/2007   bilaterally  . CATARACT EXTRACTION     both eyes  . COLONOSCOPY    . MENISCUS REPAIR  6/11   left knee---Dr Blackmon  . NASAL SINUS SURGERY  May 2014  . RETINAL TEAR REPAIR CRYOTHERAPY     right 12/06  . RETINAL TEAR REPAIR CRYOTHERAPY     Retinal tear/bleed- Right 12/06  . SQUAMOUS CELL CARCINOMA EXCISION  07/2008   left forearm  . THUMB ARTHROSCOPY  1999   Social History   Occupational History  . Occupation: retired Primary school teacher: RETIRED  Tobacco Use  . Smoking status: Never Smoker  . Smokeless tobacco: Never Used  Substance and Sexual Activity  . Alcohol use: Yes    Comment: occ.  . Drug use: Not on file  . Sexual activity: Not on file

## 2017-07-19 ENCOUNTER — Other Ambulatory Visit (INDEPENDENT_AMBULATORY_CARE_PROVIDER_SITE_OTHER): Payer: Self-pay | Admitting: Physician Assistant

## 2017-07-19 DIAGNOSIS — M25552 Pain in left hip: Secondary | ICD-10-CM

## 2017-07-20 DIAGNOSIS — M5136 Other intervertebral disc degeneration, lumbar region: Secondary | ICD-10-CM | POA: Diagnosis not present

## 2017-07-20 DIAGNOSIS — M9905 Segmental and somatic dysfunction of pelvic region: Secondary | ICD-10-CM | POA: Diagnosis not present

## 2017-07-20 DIAGNOSIS — M9903 Segmental and somatic dysfunction of lumbar region: Secondary | ICD-10-CM | POA: Diagnosis not present

## 2017-07-20 DIAGNOSIS — M955 Acquired deformity of pelvis: Secondary | ICD-10-CM | POA: Diagnosis not present

## 2017-07-26 ENCOUNTER — Ambulatory Visit: Payer: Self-pay | Admitting: *Deleted

## 2017-07-26 NOTE — Telephone Encounter (Signed)
Pt  Had  A  Fall  In beginning march   Seen  By  Dr  Silvio Pate . Had  Pain  For  For  About  I month  Was  In  [place]  -  Pt  Continued  To  Have pain l groin  And  Buttock   Saw  Orthopedist April 15 in [place]  Scheduled. Pt has  Been in [place] 5 days  And  Now  Developed  Pain r  Buttock  And  Groin.  also  Pain in both  Shoulders. Pt denies any  Urinary symptoms. Pt reports symptoms of feeling hot and flushed off  And on 3   Weeks Has not  Checked  Temp. Pt was  Advised to see a provider while in new  [place] - will  Not back in [place] till  Friday.Pt  States he declines to  See  A provider in [place]. He  States he  Will  Check his temp and if he has a fever he will see somebody.Patient   Wants  An  Appointment  With  Dr  Silvio Pate when he gets back. Appt made pt again advised he needs to be seen sooner.

## 2017-08-01 ENCOUNTER — Ambulatory Visit (INDEPENDENT_AMBULATORY_CARE_PROVIDER_SITE_OTHER): Payer: Medicare Other | Admitting: Internal Medicine

## 2017-08-01 ENCOUNTER — Encounter: Payer: Self-pay | Admitting: Internal Medicine

## 2017-08-01 VITALS — BP 106/70 | HR 88 | Temp 97.4°F | Ht 68.0 in | Wt 180.0 lb

## 2017-08-01 DIAGNOSIS — M353 Polymyalgia rheumatica: Secondary | ICD-10-CM | POA: Insufficient documentation

## 2017-08-01 DIAGNOSIS — M791 Myalgia, unspecified site: Secondary | ICD-10-CM

## 2017-08-01 LAB — SEDIMENTATION RATE: SED RATE: 39 mm/h — AB (ref 0–20)

## 2017-08-01 NOTE — Patient Instructions (Signed)
I am concerned about possible polymyalgia rheumatica

## 2017-08-01 NOTE — Assessment & Plan Note (Signed)
Discussed---even if osteoarthritis, shouldn't cause the systemic symptoms Had pain in shoulder and hip girdles Some back pain with apparent left sciatica Getting MRI of hip---can't exclude hip pathology Discussed --not Lyme without true synovitis Symptom complex is concerning for PMR Will check sed rate----if elevated, will give empiric trial with prednisone 20mg  Okay to continue the tylenol arthritis 1300mg  bid

## 2017-08-01 NOTE — Progress Notes (Signed)
Subjective:    Patient ID: Rodney Gray, male    DOB: 1950-12-28, 67 y.o.   MRN: [id number]  HPI Here with wife----having some pain Especially bad in left hip into groin Will go into his leg also Did see ortho PA Very limited motion in left hip---x-ray was okay but getting MRI  Since his fall in March, has had some chills that last 5-10 minutes Will get flushed sensation in evening Both shoulders are bothering him now Also some right hip/low back/buttock pain  Current Outpatient Medications on File Prior to Visit  Medication Sig Dispense Refill  . aspirin EC 81 MG tablet Take 1 tablet (81 mg total) by mouth daily. 90 tablet 3  . diltiazem (CARDIZEM CD) 180 MG 24 hr capsule TAKE 1 CAPSULE (180 MG TOTAL) BY MOUTH DAILY. 90 capsule 3  . fluticasone (FLONASE) 50 MCG/ACT nasal spray Place 2 sprays into both nostrils daily as needed for allergies or rhinitis.    . Multiple Vitamins-Minerals (MENS MULTIVITAMIN PLUS PO) Take 1 capsule by mouth daily.     . Omega-3 Fatty Acids (FISH OIL) 1200 MG CAPS Take 1 capsule by mouth daily.     No current facility-administered medications on file prior to visit.     Allergies  Allergen Reactions  . Ampicillin     REACTION: hives Has tolerated cephalosporins    Past Medical History:  Diagnosis Date  . Allergic rhinitis due to pollen   . Atrial fibrillation ([place])    A-Fib, 1987, 2003, 2005, EPS RX 2005 (A-Fib reocurred)  . BPH (benign prostatic hypertrophy)   . Cancer ([place])    skin CA- squamous and Basal cell  . Cataract   . Hyperlipemia    no per pt  . Hypertension    no per pt  . Osteoarthritis     Past Surgical History:  Procedure Laterality Date  . CAPSULOTOMY  08/2007   bilaterally  . CATARACT EXTRACTION     both eyes  . COLONOSCOPY    . MENISCUS REPAIR  6/11   left knee---Dr Blackmon  . NASAL SINUS SURGERY  May 2014  . RETINAL TEAR REPAIR CRYOTHERAPY     right 12/06  . RETINAL TEAR REPAIR CRYOTHERAPY     Retinal tear/bleed- Right 12/06  . SQUAMOUS CELL CARCINOMA EXCISION  07/2008   left forearm  . THUMB ARTHROSCOPY  1999    Family History  Problem Relation Age of Onset  . Hypertension Father   . Cirrhosis Father 20  . Alcohol abuse Father   . Esophageal cancer Brother   . Coronary artery disease Neg Hx   . Diabetes Neg Hx   . Cancer Neg Hx        prostate or colon  . Colon cancer Neg Hx   . Rectal cancer Neg Hx   . Stomach cancer Neg Hx     Social History   Socioeconomic History  . Marital status: Married    Spouse name: Not on file  . Number of children: Not on file  . Years of education: Not on file  . Highest education level: Not on file  Occupational History  . Occupation: retired Primary school teacher: RETIRED  Social Needs  . Financial resource strain: Not on file  . Food insecurity:    Worry: Not on file    Inability: Not on file  . Transportation needs:    Medical: Not on file    Non-medical: Not on  file  Tobacco Use  . Smoking status: Never Smoker  . Smokeless tobacco: Never Used  Substance and Sexual Activity  . Alcohol use: Yes    Comment: occ.  . Drug use: Not on file  . Sexual activity: Not on file  Lifestyle  . Physical activity:    Days per week: Not on file    Minutes per session: Not on file  . Stress: Not on file  Relationships  . Social connections:    Talks on phone: Not on file    Gets together: Not on file    Attends religious service: Not on file    Active member of club or organization: Not on file    Attends meetings of clubs or organizations: Not on file    Relationship status: Not on file  . Intimate partner violence:    Fear of current or ex partner: Not on file    Emotionally abused: Not on file    Physically abused: Not on file    Forced sexual activity: Not on file  Other Topics Concern  . Not on file  Social History Narrative   No living will--plans to do this.   Wife should make health care  decisions for him.   Would accept resuscitation attempts   Would accept tube feeds for some time--depending on prognosis   Review of Systems Appetite is good Weight stable No joint swelling  (other than DIP in left 5th finger)    Objective:   Physical Exam  Constitutional: He appears well-developed. No distress.  Musculoskeletal:  ROM in shoulders fairly good Some pain with posterior movement of shoulders No weakness  Full ROM right hip Some decrease in internal rotation left hip          Assessment & Plan:

## 2017-08-02 ENCOUNTER — Other Ambulatory Visit: Payer: Self-pay | Admitting: Internal Medicine

## 2017-08-02 MED ORDER — PREDNISONE 20 MG PO TABS: 20.0000 mg | ORAL_TABLET | Freq: Every day | ORAL | 1 refills | Status: DC

## 2017-08-03 ENCOUNTER — Ambulatory Visit
Admission: RE | Admit: 2017-08-03 | Discharge: 2017-08-03 | Disposition: A | Discharge status: Home / Self Care | Payer: Medicare Other | Source: Ambulatory Visit | Attending: Physician Assistant | Admitting: Physician Assistant

## 2017-08-03 DIAGNOSIS — M25552 Pain in left hip: Secondary | ICD-10-CM | POA: Diagnosis not present

## 2017-08-03 MED ORDER — IOPAMIDOL (ISOVUE-M 200) INJECTION 41%
1.0000 mL | Freq: Once | INTRAMUSCULAR | Status: AC
  Administered 2017-08-03: 1 mL via INTRA_ARTICULAR

## 2017-08-10 ENCOUNTER — Ambulatory Visit (INDEPENDENT_AMBULATORY_CARE_PROVIDER_SITE_OTHER): Payer: Medicare Other | Admitting: Physician Assistant

## 2017-08-10 ENCOUNTER — Encounter (INDEPENDENT_AMBULATORY_CARE_PROVIDER_SITE_OTHER): Payer: Self-pay | Admitting: Physician Assistant

## 2017-08-10 DIAGNOSIS — M25552 Pain in left hip: Secondary | ICD-10-CM | POA: Diagnosis not present

## 2017-08-10 NOTE — Progress Notes (Signed)
HPI: Mr. Gloriann Loan returns today to go over the MRI of his left hip with contrast.. Since he was seen in our office he developed bilateral shoulder pain and his other hip started bothering him.  His primary care physician felt that he may have polymyalgia rheumatica and he was placed on a prednisone Dosepak which relieved most of his symptoms within a day or so.  His sed rate was also elevated at 33.  Still having pain in the left hip but not to the severity he was having before.  He also is having decreased hip flexion actively.  He denies any radicular symptoms down the left leg. MRI dated 08/03/2017 left hip with contrast showed no labral tear.  There were no articular cartilage changes.  Essentially a normal MRI of the left hip.  Physical exam: Still has decreased flexion of the left hip actively compared to the right.  5 out of 5 strength against resistance with hip flexion fluid range of motion of the left hip without pain bilaterally.   Impression: Left hip pain Polly myalgia rheumatica  Plan: At this point time we will continue the Dosepak as prescribed by his primary care physician.  He will go back to normal activities in the gym and see if he can regain the strength and active range of motion of the left hip on his own.  However if he finds that he is unable to accomplish this he will call our office and we will send him formal physical therapy to work on range of motion strengthening the left hip.  Otherwise he will follow-up with [place] on an as-needed basis.

## 2017-08-15 ENCOUNTER — Telehealth: Payer: Self-pay | Admitting: Internal Medicine

## 2017-08-15 DIAGNOSIS — H903 Sensorineural hearing loss, bilateral: Secondary | ICD-10-CM | POA: Diagnosis not present

## 2017-08-15 NOTE — Telephone Encounter (Signed)
Copied from [place] [id number]. Topic: Quick Communication - See Telephone Encounter >> Aug 15, 2017 11:54 AM Ahmed Prima L wrote: CRM for notification. See Telephone encounter for: 08/15/17.  Patient states that the predniSONE (DELTASONE) 20 MG tablet that [name] gave him 2 weeks ago is not settling on his stomach. He is having a lot of gas and stomach acid. He said that the pharmacy gave him a liquid Mylanta. He is wondering if he needs something stronger. He would like a call back @ [phone]

## 2017-08-15 NOTE — Telephone Encounter (Signed)
Make sure he is taking it on a full stomach Okay to cut in half and split dose twice a day. If ongoing problems, can try omeprazole 20mg  daily at bedtime to see if that helps  Has he noticed any improvement with the pain?

## 2017-08-15 NOTE — Telephone Encounter (Signed)
Left detailed message on VM per DPR. Will wait to see if he calls back to advise how his pain is doing.

## 2017-08-16 DIAGNOSIS — M955 Acquired deformity of pelvis: Secondary | ICD-10-CM | POA: Diagnosis not present

## 2017-08-16 DIAGNOSIS — M9905 Segmental and somatic dysfunction of pelvic region: Secondary | ICD-10-CM | POA: Diagnosis not present

## 2017-08-16 DIAGNOSIS — M9903 Segmental and somatic dysfunction of lumbar region: Secondary | ICD-10-CM | POA: Diagnosis not present

## 2017-08-16 DIAGNOSIS — M5136 Other intervertebral disc degeneration, lumbar region: Secondary | ICD-10-CM | POA: Diagnosis not present

## 2017-08-16 NOTE — Telephone Encounter (Signed)
Spoke to pt. He said the prednisone is working well. Said the 1st day he was 90% better and the next day he was 100% better. His ENT and Chiropractor have told him he should get off of the prednisone . I advised him [name] has a plan in mind and to not stop the medication on his own. I told him the medication in lower doses along with frequent monitoring by his physician is okay to take. He will call back closer to time of it running out to find out if he needs to take the next refill, taper off, or lower the dosage.

## 2017-08-17 NOTE — Telephone Encounter (Signed)
Spoke to pt. He appreciated the answer from [name].

## 2017-08-17 NOTE — Telephone Encounter (Signed)
Let him know that the quick response just about assures the diagnosis of polymyalgia rheumatica and that he should stay on a total of 20mg  a day (split dose if better for him) until he comes in for follow up in July. At that time we will try to start weaning it down

## 2017-08-31 ENCOUNTER — Telehealth: Payer: Self-pay | Admitting: Internal Medicine

## 2017-08-31 NOTE — Telephone Encounter (Signed)
Spoke to [place] at the pharmacy.

## 2017-08-31 NOTE — Telephone Encounter (Signed)
Okay to use 10mg  but keep the dose the same

## 2017-08-31 NOTE — Telephone Encounter (Signed)
Copied from [place] [id number]. Topic: Quick Communication - See Telephone Encounter >> Aug 31, 2017 10:55 AM Arletha Grippe wrote: CRM for notification. See Telephone encounter for: 08/31/17.[facility] - predniSONE (DELTASONE) 20 MG tablet is on back order. Pharm is asking to substitute 10 mg for this.  Cb is [phone]

## 2017-09-16 ENCOUNTER — Encounter: Payer: Medicare Other | Admitting: Internal Medicine

## 2017-09-22 DIAGNOSIS — M9905 Segmental and somatic dysfunction of pelvic region: Secondary | ICD-10-CM | POA: Diagnosis not present

## 2017-09-22 DIAGNOSIS — M9903 Segmental and somatic dysfunction of lumbar region: Secondary | ICD-10-CM | POA: Diagnosis not present

## 2017-09-22 DIAGNOSIS — M955 Acquired deformity of pelvis: Secondary | ICD-10-CM | POA: Diagnosis not present

## 2017-09-22 DIAGNOSIS — M5136 Other intervertebral disc degeneration, lumbar region: Secondary | ICD-10-CM | POA: Diagnosis not present

## 2017-09-26 ENCOUNTER — Telehealth: Payer: Self-pay | Admitting: Internal Medicine

## 2017-09-26 MED ORDER — PREDNISONE 20 MG PO TABS: 20.0000 mg | ORAL_TABLET | Freq: Every day | ORAL | 0 refills | Status: DC

## 2017-09-26 NOTE — Telephone Encounter (Signed)
Left detailed message on VM per DPR. Rx sent electronically.

## 2017-09-26 NOTE — Telephone Encounter (Signed)
He needs to continue 20mg  daily until his follow up appt

## 2017-09-26 NOTE — Telephone Encounter (Signed)
See 08/15/17 phone note.

## 2017-09-26 NOTE — Telephone Encounter (Signed)
Copied from [place] [id number]. Topic: General - Other >> Sep 26, 2017 10:09 AM Lennox Solders wrote: Reason for CRM: pt is calling and he will run out of prednisone on 09-30-17. Pt has an appt with [name] on 10-05-17. Pt would like to know if md wants he to continue prednisone if so he will need a refill. [facility]

## 2017-10-05 ENCOUNTER — Encounter: Payer: Self-pay | Admitting: Internal Medicine

## 2017-10-05 ENCOUNTER — Ambulatory Visit (INDEPENDENT_AMBULATORY_CARE_PROVIDER_SITE_OTHER): Payer: Medicare Other | Admitting: Internal Medicine

## 2017-10-05 VITALS — BP 124/84 | HR 84 | Temp 97.6°F | Ht 67.75 in | Wt 176.0 lb

## 2017-10-05 DIAGNOSIS — Z7189 Other specified counseling: Secondary | ICD-10-CM

## 2017-10-05 DIAGNOSIS — Z0000 Encounter for general adult medical examination without abnormal findings: Secondary | ICD-10-CM | POA: Diagnosis not present

## 2017-10-05 DIAGNOSIS — N401 Enlarged prostate with lower urinary tract symptoms: Secondary | ICD-10-CM | POA: Diagnosis not present

## 2017-10-05 DIAGNOSIS — M353 Polymyalgia rheumatica: Secondary | ICD-10-CM

## 2017-10-05 DIAGNOSIS — I482 Chronic atrial fibrillation, unspecified: Secondary | ICD-10-CM

## 2017-10-05 DIAGNOSIS — N138 Other obstructive and reflux uropathy: Secondary | ICD-10-CM

## 2017-10-05 LAB — COMPREHENSIVE METABOLIC PANEL
ALT: 20 U/L (ref 0–53)
AST: 15 U/L (ref 0–37)
Albumin: 4.1 g/dL (ref 3.5–5.2)
Alkaline Phosphatase: 38 U/L — ABNORMAL LOW (ref 39–117)
BUN: 15 mg/dL (ref 6–23)
CALCIUM: 9.5 mg/dL (ref 8.4–10.5)
CHLORIDE: 102 meq/L (ref 96–112)
CO2: 30 meq/L (ref 19–32)
Creatinine, Ser: 0.83 mg/dL (ref 0.40–1.50)
GFR: 98.16 mL/min (ref >60.00)
Glucose, Bld: 96 mg/dL (ref 70–99)
Potassium: 4.2 mEq/L (ref 3.5–5.1)
Sodium: 138 mEq/L (ref 135–145)
Total Bilirubin: 0.9 mg/dL (ref 0.2–1.2)
Total Protein: 6.6 g/dL (ref 6.0–8.3)

## 2017-10-05 LAB — CBC
HCT: 44.3 % (ref 39.0–52.0)
Hemoglobin: 14.6 g/dL (ref 13.0–17.0)
MCHC: 33.1 g/dL (ref 30.0–36.0)
MCV: 96.8 fl (ref 78.0–100.0)
Platelets: 243 10*3/uL (ref 150.0–400.0)
RBC: 4.57 Mil/uL (ref 4.22–5.81)
RDW: 16 % — ABNORMAL HIGH (ref 11.5–15.5)
WBC: 8.2 10*3/uL (ref 4.0–10.5)

## 2017-10-05 LAB — SEDIMENTATION RATE: Sed Rate: 6 mm/hr (ref 0–20)

## 2017-10-05 MED ORDER — TETANUS-DIPHTHERIA TOXOIDS TD 5-2 LFU IM INJ: 0.5000 mL | INJECTION | Freq: Once | INTRAMUSCULAR | 0 refills | Status: AC

## 2017-10-05 MED ORDER — PREDNISONE 5 MG PO TABS: 15.0000 mg | ORAL_TABLET | Freq: Every day | ORAL | 11 refills | Status: DC

## 2017-10-05 NOTE — Assessment & Plan Note (Signed)
Mild symptoms ?No Rx needed ?

## 2017-10-05 NOTE — Assessment & Plan Note (Signed)
Planning to do advanced directives soon

## 2017-10-05 NOTE — Patient Instructions (Signed)
Please ask your pharmacist for the tetanus booster.  Decrease the prednisone to 15 mg daily. About 7/17, you can try to decrease it to 10mg  daily. August 1st, try down to 10mg  on even days and 5 mg on odd days. Then August 15, try 10mg  every other day. Let me know if you have your symptoms come back. Stay on the 10mg  every other day till you come back in.

## 2017-10-05 NOTE — Assessment & Plan Note (Signed)
Has responded well to Rx but side effects with the prednisone Will start wean

## 2017-10-05 NOTE — Assessment & Plan Note (Signed)
Chronic ASA only per Dr Lovena Le

## 2017-10-05 NOTE — Progress Notes (Signed)
Hearing Screening Comments: Wearing hearing aids Vision Screening Comments: November 2018

## 2017-10-05 NOTE — Progress Notes (Signed)
Subjective:    Patient ID: Rodney Gray, male    DOB: 09-03-1950, 67 y.o.   MRN: [id number]  HPI Here for Medicare wellness and follow up of chronic health conditions Reviewed form and advanced directives Reviewed other doctors Occasional drink of alcohol No tobacco Exercises regularly Vision basically okay Hearing aides do help some Regular exercise No falls No depression or anhedonia Independent with instrumental ADLs No sig memory issues  No pain in shoulders or hips Myalgia is gone Has gone jittery with the prednisone --and some leg cramps he relates to the prednisone No headache or sig vision changes Ready to wean  Permanent atrial fibrillation No change in exercise tolerance Walks and then goes to gym No chest pain No dizziness or syncope (expept some lightheadedness when starting to eat) No edema  Voids okay Increased frequency--but flow is okay Does have some urge--- even some leakage Mild ED--wonders about the prednisone. Not ready for medicine for this yet  Current Outpatient Medications on File Prior to Visit  Medication Sig Dispense Refill  . aspirin EC 81 MG tablet Take 1 tablet (81 mg total) by mouth daily. 90 tablet 3  . diltiazem (CARDIZEM CD) 180 MG 24 hr capsule TAKE 1 CAPSULE (180 MG TOTAL) BY MOUTH DAILY. 90 capsule 3  . fluticasone (FLONASE) 50 MCG/ACT nasal spray Place 2 sprays into both nostrils daily as needed for allergies or rhinitis.    . Multiple Vitamins-Minerals (MENS MULTIVITAMIN PLUS PO) Take 1 capsule by mouth daily.     . Omega-3 Fatty Acids (FISH OIL) 1200 MG CAPS Take 1 capsule by mouth daily.    . predniSONE (DELTASONE) 20 MG tablet Take 1 tablet (20 mg total) by mouth daily. 30 tablet 0   No current facility-administered medications on file prior to visit.     Allergies  Allergen Reactions  . Ampicillin     REACTION: hives Has tolerated cephalosporins    Past Medical History:  Diagnosis Date  . Allergic rhinitis  due to pollen   . Atrial fibrillation ([place])    A-Fib, 1987, 2003, 2005, EPS RX 2005 (A-Fib reocurred)  . BPH (benign prostatic hypertrophy)   . Cancer ([place])    skin CA- squamous and Basal cell  . Cataract   . Hyperlipemia    no per pt  . Hypertension    no per pt  . Osteoarthritis     Past Surgical History:  Procedure Laterality Date  . CAPSULOTOMY  08/2007   bilaterally  . CATARACT EXTRACTION     both eyes  . COLONOSCOPY    . MENISCUS REPAIR  6/11   left knee---Dr Blackmon  . NASAL SINUS SURGERY  May 2014  . RETINAL TEAR REPAIR CRYOTHERAPY     right 12/06  . RETINAL TEAR REPAIR CRYOTHERAPY     Retinal tear/bleed- Right 12/06  . SQUAMOUS CELL CARCINOMA EXCISION  07/2008   left forearm  . THUMB ARTHROSCOPY  1999    Family History  Problem Relation Age of Onset  . Hypertension Father   . Cirrhosis Father 74  . Alcohol abuse Father   . Esophageal cancer Brother   . Coronary artery disease Neg Hx   . Diabetes Neg Hx   . Cancer Neg Hx        prostate or colon  . Colon cancer Neg Hx   . Rectal cancer Neg Hx   . Stomach cancer Neg Hx     Social History   Socioeconomic History  .  Marital status: Married    Spouse name: Not on file  . Number of children: Not on file  . Years of education: Not on file  . Highest education level: Not on file  Occupational History  . Occupation: retired Primary school teacher: RETIRED  Social Needs  . Financial resource strain: Not on file  . Food insecurity:    Worry: Not on file    Inability: Not on file  . Transportation needs:    Medical: Not on file    Non-medical: Not on file  Tobacco Use  . Smoking status: Never Smoker  . Smokeless tobacco: Never Used  Substance and Sexual Activity  . Alcohol use: Yes    Comment: occ.  . Drug use: Not on file  . Sexual activity: Not on file  Lifestyle  . Physical activity:    Days per week: Not on file    Minutes per session: Not on file  . Stress: Not on file    Relationships  . Social connections:    Talks on phone: Not on file    Gets together: Not on file    Attends religious service: Not on file    Active member of club or organization: Not on file    Attends meetings of clubs or organizations: Not on file    Relationship status: Not on file  . Intimate partner violence:    Fear of current or ex partner: Not on file    Emotionally abused: Not on file    Physically abused: Not on file    Forced sexual activity: Not on file  Other Topics Concern  . Not on file  Social History Narrative   No living will--plans to do this.   Wife should make health care decisions for him--alternate is son [name]   Would accept resuscitation attempts   Would accept tube feeds for some time--depending on prognosis   Review of Systems Appetite is fine Weight stable Sleeps okay---not as good since on prednisone. Nocturia x 1 Wears seat belt Teeth are okay---did need a crown replacement. Sees derm yearly--no recent problems Heartburn at first with prednisone--better with 10 bid. mylanta will help. No dysphagia Bowels are fine No back or joint pains Uses flonase as needed for allergy symptoms-- nothing recently    Objective:   Physical Exam  Constitutional: He is oriented to person, place, and time. He appears well-developed. No distress.  HENT:  Mouth/Throat: Oropharynx is clear and moist. No oropharyngeal exudate.  Neck: No thyromegaly present.  Cardiovascular: Normal rate, normal heart sounds and intact distal pulses. Exam reveals no gallop.  No murmur heard. irregular  Respiratory: Effort normal and breath sounds normal. No respiratory distress. He has no wheezes. He has no rales.  GI: Soft. There is no tenderness.  Musculoskeletal: He exhibits no edema or tenderness.  Lymphadenopathy:    He has no cervical adenopathy.  Neurological: He is alert and oriented to person, place, and time.  President--"Donald Trump, Barack Obama,  Bush" [id number] Recall 3/3  Skin: No rash noted. No erythema.  Psychiatric: He has a normal mood and affect. His behavior is normal.           Assessment & Plan:

## 2017-10-05 NOTE — Assessment & Plan Note (Signed)
I have personally reviewed the Medicare Annual Wellness questionnaire and have noted 1. The patient's medical and social history 2. Their use of alcohol, tobacco or illicit drugs 3. Their current medications and supplements 4. The patient's functional ability including ADL's, fall risks, home safety risks and hearing or visual             impairment. 5. Diet and physical activities 6. Evidence for depression or mood disorders  The patients weight, height, BMI and visual acuity have been recorded in the chart I have made referrals, counseling and provided education to the patient based review of the above and I have provided the pt with a written personalized care plan for preventive services.  I have provided you with a copy of your personalized plan for preventive services. Please take the time to review along with your updated medication list.  Rx for Td Discussed PSA--will check Colon due 2026 Keeps fit Yearly flu vaccine

## 2017-10-20 DIAGNOSIS — M9905 Segmental and somatic dysfunction of pelvic region: Secondary | ICD-10-CM | POA: Diagnosis not present

## 2017-10-20 DIAGNOSIS — M9903 Segmental and somatic dysfunction of lumbar region: Secondary | ICD-10-CM | POA: Diagnosis not present

## 2017-10-20 DIAGNOSIS — M5136 Other intervertebral disc degeneration, lumbar region: Secondary | ICD-10-CM | POA: Diagnosis not present

## 2017-10-20 DIAGNOSIS — M955 Acquired deformity of pelvis: Secondary | ICD-10-CM | POA: Diagnosis not present

## 2017-10-25 ENCOUNTER — Other Ambulatory Visit: Payer: Self-pay | Admitting: Internal Medicine

## 2017-11-24 DIAGNOSIS — M9903 Segmental and somatic dysfunction of lumbar region: Secondary | ICD-10-CM | POA: Diagnosis not present

## 2017-11-24 DIAGNOSIS — M955 Acquired deformity of pelvis: Secondary | ICD-10-CM | POA: Diagnosis not present

## 2017-11-24 DIAGNOSIS — M5136 Other intervertebral disc degeneration, lumbar region: Secondary | ICD-10-CM | POA: Diagnosis not present

## 2017-11-24 DIAGNOSIS — M9905 Segmental and somatic dysfunction of pelvic region: Secondary | ICD-10-CM | POA: Diagnosis not present

## 2017-12-12 DIAGNOSIS — H903 Sensorineural hearing loss, bilateral: Secondary | ICD-10-CM | POA: Diagnosis not present

## 2017-12-12 DIAGNOSIS — J328 Other chronic sinusitis: Secondary | ICD-10-CM | POA: Diagnosis not present

## 2017-12-22 DIAGNOSIS — M955 Acquired deformity of pelvis: Secondary | ICD-10-CM | POA: Diagnosis not present

## 2017-12-22 DIAGNOSIS — M9905 Segmental and somatic dysfunction of pelvic region: Secondary | ICD-10-CM | POA: Diagnosis not present

## 2017-12-22 DIAGNOSIS — M5136 Other intervertebral disc degeneration, lumbar region: Secondary | ICD-10-CM | POA: Diagnosis not present

## 2017-12-22 DIAGNOSIS — M9903 Segmental and somatic dysfunction of lumbar region: Secondary | ICD-10-CM | POA: Diagnosis not present

## 2017-12-29 ENCOUNTER — Ambulatory Visit (INDEPENDENT_AMBULATORY_CARE_PROVIDER_SITE_OTHER): Payer: Medicare Other

## 2017-12-29 DIAGNOSIS — Z23 Encounter for immunization: Secondary | ICD-10-CM | POA: Diagnosis not present

## 2018-01-10 DIAGNOSIS — M5136 Other intervertebral disc degeneration, lumbar region: Secondary | ICD-10-CM | POA: Diagnosis not present

## 2018-01-10 DIAGNOSIS — M9903 Segmental and somatic dysfunction of lumbar region: Secondary | ICD-10-CM | POA: Diagnosis not present

## 2018-01-10 DIAGNOSIS — M9905 Segmental and somatic dysfunction of pelvic region: Secondary | ICD-10-CM | POA: Diagnosis not present

## 2018-01-10 DIAGNOSIS — M955 Acquired deformity of pelvis: Secondary | ICD-10-CM | POA: Diagnosis not present

## 2018-01-12 DIAGNOSIS — M5136 Other intervertebral disc degeneration, lumbar region: Secondary | ICD-10-CM | POA: Diagnosis not present

## 2018-01-12 DIAGNOSIS — M955 Acquired deformity of pelvis: Secondary | ICD-10-CM | POA: Diagnosis not present

## 2018-01-12 DIAGNOSIS — M9905 Segmental and somatic dysfunction of pelvic region: Secondary | ICD-10-CM | POA: Diagnosis not present

## 2018-01-12 DIAGNOSIS — M9903 Segmental and somatic dysfunction of lumbar region: Secondary | ICD-10-CM | POA: Diagnosis not present

## 2018-01-16 DIAGNOSIS — M955 Acquired deformity of pelvis: Secondary | ICD-10-CM | POA: Diagnosis not present

## 2018-01-16 DIAGNOSIS — M5136 Other intervertebral disc degeneration, lumbar region: Secondary | ICD-10-CM | POA: Diagnosis not present

## 2018-01-16 DIAGNOSIS — M9903 Segmental and somatic dysfunction of lumbar region: Secondary | ICD-10-CM | POA: Diagnosis not present

## 2018-01-16 DIAGNOSIS — M9905 Segmental and somatic dysfunction of pelvic region: Secondary | ICD-10-CM | POA: Diagnosis not present

## 2018-01-17 ENCOUNTER — Ambulatory Visit (INDEPENDENT_AMBULATORY_CARE_PROVIDER_SITE_OTHER): Payer: Medicare Other

## 2018-01-17 ENCOUNTER — Ambulatory Visit (INDEPENDENT_AMBULATORY_CARE_PROVIDER_SITE_OTHER): Payer: Medicare Other | Admitting: Orthopaedic Surgery

## 2018-01-17 ENCOUNTER — Encounter (INDEPENDENT_AMBULATORY_CARE_PROVIDER_SITE_OTHER): Payer: Self-pay | Admitting: Orthopaedic Surgery

## 2018-01-17 ENCOUNTER — Telehealth (INDEPENDENT_AMBULATORY_CARE_PROVIDER_SITE_OTHER): Payer: Self-pay | Admitting: Physician Assistant

## 2018-01-17 VITALS — Ht 67.0 in | Wt 176.0 lb

## 2018-01-17 DIAGNOSIS — M545 Low back pain, unspecified: Secondary | ICD-10-CM

## 2018-01-17 DIAGNOSIS — M7061 Trochanteric bursitis, right hip: Secondary | ICD-10-CM

## 2018-01-17 MED ORDER — LIDOCAINE HCL 1 % IJ SOLN
3.0000 mL | INTRAMUSCULAR | Status: AC | PRN
  Administered 2018-01-17: 3 mL

## 2018-01-17 MED ORDER — METHYLPREDNISOLONE ACETATE 40 MG/ML IJ SUSP
40.0000 mg | INTRAMUSCULAR | Status: AC | PRN
Start: 2018-01-17 — End: 2018-01-17
  Administered 2018-01-17: 40 mg via INTRA_ARTICULAR

## 2018-01-17 MED ORDER — TIZANIDINE HCL 4 MG PO TABS: 4.0000 mg | ORAL_TABLET | Freq: Three times a day (TID) | ORAL | 0 refills | Status: DC | PRN

## 2018-01-17 MED ORDER — CYCLOBENZAPRINE HCL 10 MG PO TABS: 10.0000 mg | ORAL_TABLET | Freq: Three times a day (TID) | ORAL | 0 refills | Status: DC | PRN

## 2018-01-17 NOTE — Telephone Encounter (Signed)
Prior authorization needed for muscle relaxer prescribed this am. Phone number to complete prior auth [id number]  auth # [id number]

## 2018-01-17 NOTE — Telephone Encounter (Signed)
His insurance won't cover Flexeril, want to send in something different? Maybe zanaflex?

## 2018-01-17 NOTE — Telephone Encounter (Signed)
I sent in some Zanaflex. 

## 2018-01-17 NOTE — Progress Notes (Signed)
Office Visit Note   Patient: Rodney Gray           Date of Birth: 1950/12/07           MRN: [id number] Visit Date: 01/17/2018              Requested by: Venia Carbon, MD [place], [place] [id number] PCP: Venia Carbon, MD   Assessment & Plan: Visit Diagnoses:  1. Acute right-sided low back pain without sciatica   2. Trochanteric bursitis, right hip     Plan: We will see what type of response he has to the right hip trochanteric injection.  He is given Flexeril to take at night.  Moist heat to the back.  Whenever he returns to go to physical therapy so he can learn proper back exercises for home exercise program.  His pain persist or becomes worse can always call the office we can order an MRI this most likely before epidural steroid planning.  Questions were encouraged and answered at length.  Follow-Up Instructions: Return if symptoms worsen or fail to improve.   Orders:  Orders Placed This Encounter  Procedures  . Large Joint Inj  . XR Lumbar Spine 2-3 Views   Meds ordered this encounter  Medications  . cyclobenzaprine (FLEXERIL) 10 MG tablet    Sig: Take 1 tablet (10 mg total) by mouth 3 (three) times daily as needed for muscle spasms.    Dispense:  30 tablet    Refill:  0      Procedures: Large Joint Inj: R greater trochanter on 01/17/2018 8:57 AM Indications: pain Details: 22 G 1.5 in needle, lateral approach  Arthrogram: No  Medications: 3 mL lidocaine 1 %; 40 mg methylPREDNISolone acetate 40 MG/ML Outcome: tolerated well, no immediate complications Procedure, treatment alternatives, risks and benefits explained, specific risks discussed. Consent was given by the patient. Immediately prior to procedure a time out was called to verify the correct patient, procedure, equipment, support staff and site/side marked as required. Patient was prepped and draped in the usual sterile fashion.       Clinical Data: No additional  findings.   Subjective: Chief Complaint  Patient presents with  . Lower Back - Pain    HPI [name] comes in today with low back pain.  Pain began last Monday he is doing some leg lift exercises on his own.  States that the time of the insult he felt a pop and some stabbing pain in his low back.  Pain to the right lower back region.  He also is having some pain in the right lateral hip.  He has had no numbness tingling down either leg.  No radicular symptoms down either leg.  He is on Medrol chronically due to poly-myalgia rheumatica.  Is been a chiropractor 3 times since last Monday for adjustments.  He states that his pain overall is dissipating but he still having waking pain whenever he rolls over on the right hip at night.  He is going out of town tomorrow for a week. Denies any bowel or bladder dysfunction. Review of Systems Please see HPI otherwise negative  Objective: Vital Signs: Ht 5\' 7"  (1.702 m)   Wt 176 lb (79.8 kg)   BMI 27.57 kg/m   Physical Exam  Constitutional: He is oriented to person, place, and time. He appears well-developed and well-nourished. No distress.  Cardiovascular: Intact distal pulses.  Pulmonary/Chest: Effort normal.  Neurological: He is alert  and oriented to person, place, and time.  Skin: He is not diaphoretic.  Psychiatric: He has a normal mood and affect.    Ortho Exam 5 out of 5 strength against resistance bilateral lower extremities.  Sensation intact bilateral feet.  Dorsal pedal pulses are intact bilaterally.  Positive straight leg raise on the right negative on the left.  Good range of motion bilateral hips.  He has limited with extension of the lumbar spine flexion he comes within 2 inches of touching his toes. Specialty Comments:  No specialty comments available.  Imaging: Xr Lumbar Spine 2-3 Views  Result Date: 01/17/2018 Lumbar spine 2 views AP lateral shows some diffuse mild degenerative changes but no acute fractures no acute  findings.  Normal lordotic curvature.    PMFS History: Patient Active Problem List   Diagnosis Date Noted  . Polymyalgia rheumatica ([place]) 08/01/2017  . Advance directive discussed with patient 09/12/2015  . BPH with obstruction/lower urinary tract symptoms   . Allergic rhinitis due to pollen   . Routine general medical examination at a health care facility 08/19/2010  . Atrial fibrillation ([place]) 05/12/2009  . OSTEOARTHRITIS 09/13/2006   Past Medical History:  Diagnosis Date  . Allergic rhinitis due to pollen   . Atrial fibrillation ([place])    A-Fib, 1987, 2003, 2005, EPS RX 2005 (A-Fib reocurred)  . BPH (benign prostatic hypertrophy)   . Cancer ([place])    skin CA- squamous and Basal cell  . Cataract   . Hyperlipemia    no per pt  . Hypertension    no per pt  . Osteoarthritis     Family History  Problem Relation Age of Onset  . Hypertension Father   . Cirrhosis Father 63  . Alcohol abuse Father   . Esophageal cancer Brother   . Coronary artery disease Neg Hx   . Diabetes Neg Hx   . Cancer Neg Hx        prostate or colon  . Colon cancer Neg Hx   . Rectal cancer Neg Hx   . Stomach cancer Neg Hx     Past Surgical History:  Procedure Laterality Date  . CAPSULOTOMY  08/2007   bilaterally  . CATARACT EXTRACTION     both eyes  . COLONOSCOPY    . MENISCUS REPAIR  6/11   left knee---Dr Blackmon  . NASAL SINUS SURGERY  May 2014  . RETINAL TEAR REPAIR CRYOTHERAPY     right 12/06  . RETINAL TEAR REPAIR CRYOTHERAPY     Retinal tear/bleed- Right 12/06  . SQUAMOUS CELL CARCINOMA EXCISION  07/2008   left forearm  . THUMB ARTHROSCOPY  1999   Social History   Occupational History  . Occupation: retired Primary school teacher: RETIRED  Tobacco Use  . Smoking status: Never Smoker  . Smokeless tobacco: Never Used  Substance and Sexual Activity  . Alcohol use: Yes    Comment: occ.  . Drug use: Not on file  . Sexual activity: Not on file

## 2018-01-18 DIAGNOSIS — M5136 Other intervertebral disc degeneration, lumbar region: Secondary | ICD-10-CM | POA: Diagnosis not present

## 2018-01-18 DIAGNOSIS — M9903 Segmental and somatic dysfunction of lumbar region: Secondary | ICD-10-CM | POA: Diagnosis not present

## 2018-01-18 DIAGNOSIS — M9905 Segmental and somatic dysfunction of pelvic region: Secondary | ICD-10-CM | POA: Diagnosis not present

## 2018-01-18 DIAGNOSIS — M955 Acquired deformity of pelvis: Secondary | ICD-10-CM | POA: Diagnosis not present

## 2018-01-25 DIAGNOSIS — M9903 Segmental and somatic dysfunction of lumbar region: Secondary | ICD-10-CM | POA: Diagnosis not present

## 2018-01-25 DIAGNOSIS — M955 Acquired deformity of pelvis: Secondary | ICD-10-CM | POA: Diagnosis not present

## 2018-01-25 DIAGNOSIS — M9905 Segmental and somatic dysfunction of pelvic region: Secondary | ICD-10-CM | POA: Diagnosis not present

## 2018-01-25 DIAGNOSIS — M5136 Other intervertebral disc degeneration, lumbar region: Secondary | ICD-10-CM | POA: Diagnosis not present

## 2018-02-08 DIAGNOSIS — M5136 Other intervertebral disc degeneration, lumbar region: Secondary | ICD-10-CM | POA: Diagnosis not present

## 2018-02-08 DIAGNOSIS — M9905 Segmental and somatic dysfunction of pelvic region: Secondary | ICD-10-CM | POA: Diagnosis not present

## 2018-02-08 DIAGNOSIS — M9903 Segmental and somatic dysfunction of lumbar region: Secondary | ICD-10-CM | POA: Diagnosis not present

## 2018-02-08 DIAGNOSIS — M955 Acquired deformity of pelvis: Secondary | ICD-10-CM | POA: Diagnosis not present

## 2018-02-09 DIAGNOSIS — H59811 Chorioretinal scars after surgery for detachment, right eye: Secondary | ICD-10-CM | POA: Diagnosis not present

## 2018-02-09 DIAGNOSIS — H52223 Regular astigmatism, bilateral: Secondary | ICD-10-CM | POA: Diagnosis not present

## 2018-02-09 DIAGNOSIS — Z9842 Cataract extraction status, left eye: Secondary | ICD-10-CM | POA: Diagnosis not present

## 2018-02-09 DIAGNOSIS — Z9841 Cataract extraction status, right eye: Secondary | ICD-10-CM | POA: Diagnosis not present

## 2018-03-01 DIAGNOSIS — M9905 Segmental and somatic dysfunction of pelvic region: Secondary | ICD-10-CM | POA: Diagnosis not present

## 2018-03-01 DIAGNOSIS — M5136 Other intervertebral disc degeneration, lumbar region: Secondary | ICD-10-CM | POA: Diagnosis not present

## 2018-03-01 DIAGNOSIS — M955 Acquired deformity of pelvis: Secondary | ICD-10-CM | POA: Diagnosis not present

## 2018-03-01 DIAGNOSIS — M9903 Segmental and somatic dysfunction of lumbar region: Secondary | ICD-10-CM | POA: Diagnosis not present

## 2018-03-09 ENCOUNTER — Ambulatory Visit (INDEPENDENT_AMBULATORY_CARE_PROVIDER_SITE_OTHER): Payer: Medicare Other | Admitting: Family Medicine

## 2018-03-09 ENCOUNTER — Encounter: Payer: Self-pay | Admitting: Family Medicine

## 2018-03-09 VITALS — BP 132/64 | HR 96 | Temp 98.0°F | Resp 12 | Wt 186.2 lb

## 2018-03-09 DIAGNOSIS — B9789 Other viral agents as the cause of diseases classified elsewhere: Secondary | ICD-10-CM

## 2018-03-09 DIAGNOSIS — J069 Acute upper respiratory infection, unspecified: Secondary | ICD-10-CM

## 2018-03-09 NOTE — Patient Instructions (Addendum)
Based on your symptoms, it looks like you have a virus.   Antibiotics are not need for a viral infection but the following will help:   1. Drink plenty of fluids 2. Get lots of rest  Sinus Congestion 1) Neti Pot (Saline rinse) -- 2 times day -- if tolerated 2) Flonase (Store Brand ok) - once daily 3) Over the counter congestion medications  Cough 1) Cough drops can be helpful 2) Honey is proven to be one of the best cough medications   Sore Throat 1) Honey as above, cough drops 2) Ibuprofen or Aleve can be helpful 3) Salt water Gargles  If you develop fevers (Temperature >100.4), chills, worsening symptoms or symptoms lasting longer than 10 days return to clinic.

## 2018-03-09 NOTE — Progress Notes (Signed)
   Subjective:     Rodney Gray is a 67 y.o. male presenting for Cough (symptoms started on 02/28/18, started with sore throat but this is better now. Does have cough productive phlegm with yellowish/greenish phlegm. No fever. )     URI   This is a new problem. The current episode started 1 to 4 weeks ago. The problem has been gradually improving. There has been no fever. Associated symptoms include chest pain (tightness yesterday), congestion, coughing (yellow phlegm), sinus pain and a sore throat (resolved). Pertinent negatives include no ear pain, nausea, plugged ear sensation, vomiting or wheezing. He has tried acetaminophen and increased fluids (flonase, honey) for the symptoms. The treatment provided mild relief.   Exposure to sick grandkids   Tried hydrocodone-homatropine in the past w/ side effects   Review of Systems  HENT: Positive for congestion, sinus pain and sore throat (resolved). Negative for ear pain.   Respiratory: Positive for cough (yellow phlegm). Negative for wheezing.   Cardiovascular: Positive for chest pain (tightness yesterday).  Gastrointestinal: Negative for nausea and vomiting.     Social History   Tobacco Use  Smoking Status Never Smoker  Smokeless Tobacco Never Used        Objective:    BP Readings from Last 3 Encounters:  03/09/18 132/64  10/05/17 124/84  08/01/17 106/70   Wt Readings from Last 3 Encounters:  03/09/18 186 lb 4 oz (84.5 kg)  01/17/18 176 lb (79.8 kg)  10/05/17 176 lb (79.8 kg)    BP 132/64   Pulse 96   Temp 98 F (36.7 C)   Resp 12   Wt 186 lb 4 oz (84.5 kg)   SpO2 97%   BMI 29.17 kg/m    Physical Exam  Constitutional: He appears well-developed and well-nourished. He does not appear ill. No distress.  HENT:  Head: Normocephalic and atraumatic.  Right Ear: Tympanic membrane and ear canal normal.  Left Ear: Tympanic membrane and ear canal normal.  Nose: Mucosal edema and rhinorrhea present. Right  sinus exhibits no maxillary sinus tenderness and no frontal sinus tenderness. Left sinus exhibits no maxillary sinus tenderness and no frontal sinus tenderness.  Mouth/Throat: Uvula is midline and mucous membranes are normal. Posterior oropharyngeal erythema present. No oropharyngeal exudate or posterior oropharyngeal edema. Tonsils are 0 on the right. Tonsils are 0 on the left.  Eyes: EOM are normal.  Neck: Neck supple.  Cardiovascular: Normal rate and regular rhythm.  No murmur heard. Pulmonary/Chest: Effort normal and breath sounds normal. No respiratory distress.  Lymphadenopathy:    He has no cervical adenopathy.  Neurological: He is alert.  Skin: Skin is warm and dry. Capillary refill takes less than 2 seconds.  Psychiatric: He has a normal mood and affect.          Assessment & Plan:   Problem List Items Addressed This Visit    None    Visit Diagnoses    Viral URI with cough    -  Primary     Clear lungs, afebrile.  Symptomatic care Discussed that cough may last 6 weeks Also discussed that in setting of prednisone if not improving may consider reflux as the cause of the cough as well  Return if symptoms worsen or fail to improve.  Lesleigh Noe, MD

## 2018-03-22 DIAGNOSIS — M5136 Other intervertebral disc degeneration, lumbar region: Secondary | ICD-10-CM | POA: Diagnosis not present

## 2018-03-22 DIAGNOSIS — M9903 Segmental and somatic dysfunction of lumbar region: Secondary | ICD-10-CM | POA: Diagnosis not present

## 2018-03-22 DIAGNOSIS — M955 Acquired deformity of pelvis: Secondary | ICD-10-CM | POA: Diagnosis not present

## 2018-03-22 DIAGNOSIS — Z85828 Personal history of other malignant neoplasm of skin: Secondary | ICD-10-CM | POA: Diagnosis not present

## 2018-03-22 DIAGNOSIS — M9905 Segmental and somatic dysfunction of pelvic region: Secondary | ICD-10-CM | POA: Diagnosis not present

## 2018-03-22 DIAGNOSIS — Z859 Personal history of malignant neoplasm, unspecified: Secondary | ICD-10-CM | POA: Diagnosis not present

## 2018-03-22 DIAGNOSIS — L570 Actinic keratosis: Secondary | ICD-10-CM | POA: Diagnosis not present

## 2018-03-22 DIAGNOSIS — Z1283 Encounter for screening for malignant neoplasm of skin: Secondary | ICD-10-CM | POA: Diagnosis not present

## 2018-03-22 DIAGNOSIS — Z872 Personal history of diseases of the skin and subcutaneous tissue: Secondary | ICD-10-CM | POA: Diagnosis not present

## 2018-04-07 ENCOUNTER — Encounter: Payer: Self-pay | Admitting: Internal Medicine

## 2018-04-07 ENCOUNTER — Ambulatory Visit (INDEPENDENT_AMBULATORY_CARE_PROVIDER_SITE_OTHER): Payer: Medicare Other | Admitting: Internal Medicine

## 2018-04-07 VITALS — BP 122/86 | HR 68 | Temp 97.9°F | Ht 67.0 in | Wt 194.0 lb

## 2018-04-07 DIAGNOSIS — M353 Polymyalgia rheumatica: Secondary | ICD-10-CM | POA: Diagnosis not present

## 2018-04-07 DIAGNOSIS — M7061 Trochanteric bursitis, right hip: Secondary | ICD-10-CM | POA: Diagnosis not present

## 2018-04-07 MED ORDER — PREDNISONE 5 MG PO TABS: 10.0000 mg | ORAL_TABLET | ORAL | 0 refills | Status: DC

## 2018-04-07 MED ORDER — PREDNISONE 2.5 MG PO TABS: 2.5000 mg | ORAL_TABLET | Freq: Every day | ORAL | 5 refills | Status: DC

## 2018-04-07 NOTE — Assessment & Plan Note (Signed)
Did have 3 great days after steroid injection by ortho Discussed ice Can use tylenol

## 2018-04-07 NOTE — Patient Instructions (Signed)
Please try cutting back on the prednisone to 7.5mg  every other day. If you continue to do well, cut back to 5mg  every other day in February. If this works out, you can stay at this dose, or try to continue to wean it down at that rate.

## 2018-04-07 NOTE — Progress Notes (Signed)
Subjective:    Patient ID: Rodney Gray, male    DOB: [birth year], 68 y.o.   MRN: [id number]  HPI Here for follow up of polymyalgia rheumatica  Has been able to wean the prednisone down Now on 10mg  every other day Since August Does have some soreness in right thigh---may be bursitis (not from PMR)  No trouble walking or at gym Hard when he is lying down Tylenol helps a little  Current Outpatient Medications on File Prior to Visit  Medication Sig Dispense Refill  . aspirin EC 81 MG tablet Take 1 tablet (81 mg total) by mouth daily. 90 tablet 3  . diltiazem (CARDIZEM CD) 180 MG 24 hr capsule TAKE 1 CAPSULE (180 MG TOTAL) BY MOUTH DAILY. 90 capsule 3  . fluticasone (FLONASE) 50 MCG/ACT nasal spray Place 2 sprays into both nostrils daily as needed for allergies or rhinitis.    Marland Kitchen montelukast (SINGULAIR) 10 MG tablet Take 10 mg by mouth at bedtime.    . Multiple Vitamins-Minerals (MENS MULTIVITAMIN PLUS PO) Take 1 capsule by mouth daily.     . Omega-3 Fatty Acids (FISH OIL) 1200 MG CAPS Take 1 capsule by mouth daily.    . predniSONE (DELTASONE) 5 MG tablet Take 3 tablets (15 mg total) by mouth daily. Wean as directed (Patient taking differently: Take 10 mg by mouth every other day. ) 90 tablet 11   No current facility-administered medications on file prior to visit.     Allergies  Allergen Reactions  . Ampicillin     REACTION: hives Has tolerated cephalosporins    Past Medical History:  Diagnosis Date  . Allergic rhinitis due to pollen   . Atrial fibrillation ([place])    A-Fib, 1987, 2003, 2005, EPS RX 2005 (A-Fib reocurred)  . BPH (benign prostatic hypertrophy)   . Cancer ([place])    skin CA- squamous and Basal cell  . Cataract   . Hyperlipemia    no per pt  . Hypertension    no per pt  . Osteoarthritis     Past Surgical History:  Procedure Laterality Date  . CAPSULOTOMY  08/2007   bilaterally  . CATARACT EXTRACTION     both eyes  . COLONOSCOPY    . MENISCUS  REPAIR  6/11   left knee---Dr Blackmon  . NASAL SINUS SURGERY  May 2014  . RETINAL TEAR REPAIR CRYOTHERAPY     right 12/06  . RETINAL TEAR REPAIR CRYOTHERAPY     Retinal tear/bleed- Right 12/06  . SQUAMOUS CELL CARCINOMA EXCISION  07/2008   left forearm  . THUMB ARTHROSCOPY  1999    Family History  Problem Relation Age of Onset  . Hypertension Father   . Cirrhosis Father 61  . Alcohol abuse Father   . Esophageal cancer Brother   . Coronary artery disease Neg Hx   . Diabetes Neg Hx   . Cancer Neg Hx        prostate or colon  . Colon cancer Neg Hx   . Rectal cancer Neg Hx   . Stomach cancer Neg Hx     Social History   Socioeconomic History  . Marital status: Married    Spouse name: Not on file  . Number of children: Not on file  . Years of education: Not on file  . Highest education level: Not on file  Occupational History  . Occupation: retired Primary school teacher: RETIRED  Social Needs  . Emergency planning/management officer  strain: Not on file  . Food insecurity:    Worry: Not on file    Inability: Not on file  . Transportation needs:    Medical: Not on file    Non-medical: Not on file  Tobacco Use  . Smoking status: Never Smoker  . Smokeless tobacco: Never Used  Substance and Sexual Activity  . Alcohol use: Yes    Comment: occ.  . Drug use: Not on file  . Sexual activity: Not on file  Lifestyle  . Physical activity:    Days per week: Not on file    Minutes per session: Not on file  . Stress: Not on file  Relationships  . Social connections:    Talks on phone: Not on file    Gets together: Not on file    Attends religious service: Not on file    Active member of club or organization: Not on file    Attends meetings of clubs or organizations: Not on file    Relationship status: Not on file  . Intimate partner violence:    Fear of current or ex partner: Not on file    Emotionally abused: Not on file    Physically abused: Not on file    Forced  sexual activity: Not on file  Other Topics Concern  . Not on file  Social History Narrative   No living will--plans to do this.   Wife should make health care decisions for him--alternate is son [name]   Would accept resuscitation attempts   Would accept tube feeds for some time--depending on prognosis   Review of Systems Not sleeping well since the right hip/thigh pain---- ?bursitis Appetite is good---has gained weight on prednisone Now back going to the gym    Objective:   Physical Exam  Constitutional: He appears well-developed. No distress.  Musculoskeletal:     Comments: Normal ROM right hip Pain lateral but low along right hip (lower than usual for bursa)           Assessment & Plan:

## 2018-04-07 NOTE — Assessment & Plan Note (Signed)
This seems to in remission Discussed trying to wean further--- by 2.5mg  monthly

## 2018-04-21 ENCOUNTER — Encounter: Payer: Self-pay | Admitting: Internal Medicine

## 2018-04-26 DIAGNOSIS — M5136 Other intervertebral disc degeneration, lumbar region: Secondary | ICD-10-CM | POA: Diagnosis not present

## 2018-04-26 DIAGNOSIS — M9903 Segmental and somatic dysfunction of lumbar region: Secondary | ICD-10-CM | POA: Diagnosis not present

## 2018-04-26 DIAGNOSIS — M955 Acquired deformity of pelvis: Secondary | ICD-10-CM | POA: Diagnosis not present

## 2018-04-26 DIAGNOSIS — M9905 Segmental and somatic dysfunction of pelvic region: Secondary | ICD-10-CM | POA: Diagnosis not present

## 2018-05-07 ENCOUNTER — Other Ambulatory Visit: Payer: Self-pay | Admitting: Internal Medicine

## 2018-05-09 ENCOUNTER — Encounter: Payer: Self-pay | Admitting: Internal Medicine

## 2018-05-09 ENCOUNTER — Ambulatory Visit (INDEPENDENT_AMBULATORY_CARE_PROVIDER_SITE_OTHER): Payer: Medicare Other | Admitting: Internal Medicine

## 2018-05-09 VITALS — BP 124/64 | HR 74 | Ht 67.0 in | Wt 187.0 lb

## 2018-05-09 DIAGNOSIS — I482 Chronic atrial fibrillation, unspecified: Secondary | ICD-10-CM | POA: Diagnosis not present

## 2018-05-09 NOTE — Progress Notes (Signed)
HPI Rodney Gray returns today for ongoing evaluation of atrial fibrillation. He is a pleasant 68 yo man with no other medical problems. He uses a calcium channel blocker for rate control. He remains active exercising 3-4 times a week and is also active. He denies chest pain or sob. No edema. He has been bothered by musculoskeletal pain and started on prednisone. He developed some stomach irritation but this has resolved.   Allergies  Allergen Reactions  . Ampicillin     REACTION: hives Has tolerated cephalosporins     Current Outpatient Medications  Medication Sig Dispense Refill  . aspirin EC 81 MG tablet Take 1 tablet (81 mg total) by mouth daily. 90 tablet 3  . diltiazem (CARDIZEM CD) 180 MG 24 hr capsule TAKE 1 CAPSULE BY MOUTH EVERY DAY 90 capsule 3  . fluticasone (FLONASE) 50 MCG/ACT nasal spray Place 2 sprays into both nostrils daily as needed for allergies or rhinitis.    Marland Kitchen montelukast (SINGULAIR) 10 MG tablet Take 10 mg by mouth at bedtime.    . Multiple Vitamins-Minerals (MENS MULTIVITAMIN PLUS PO) Take 1 capsule by mouth daily.     . Omega-3 Fatty Acids (FISH OIL) 1200 MG CAPS Take 1 capsule by mouth daily.    . predniSONE (DELTASONE) 5 MG tablet Take 2 tablets (10 mg total) by mouth every other day. (Patient taking differently: Take 5 mg by mouth every other day. ) 1 tablet 0   No current facility-administered medications for this visit.      Past Medical History:  Diagnosis Date  . Allergic rhinitis due to pollen   . Atrial fibrillation ([place])    A-Fib, 1987, 2003, 2005, EPS RX 2005 (A-Fib reocurred)  . BPH (benign prostatic hypertrophy)   . Cancer ([place])    skin CA- squamous and Basal cell  . Cataract   . Hyperlipemia    no per pt  . Hypertension    no per pt  . Osteoarthritis     ROS:   All systems reviewed and negative except as noted in the HPI.   Past Surgical History:  Procedure Laterality Date  . CAPSULOTOMY  08/2007   bilaterally  .  CATARACT EXTRACTION     both eyes  . COLONOSCOPY    . MENISCUS REPAIR  6/11   left knee---Dr Blackmon  . NASAL SINUS SURGERY  May 2014  . RETINAL TEAR REPAIR CRYOTHERAPY     right 12/06  . RETINAL TEAR REPAIR CRYOTHERAPY     Retinal tear/bleed- Right 12/06  . SQUAMOUS CELL CARCINOMA EXCISION  07/2008   left forearm  . THUMB ARTHROSCOPY  1999     Family History  Problem Relation Age of Onset  . Hypertension Father   . Cirrhosis Father 77  . Alcohol abuse Father   . Esophageal cancer Brother   . Coronary artery disease Neg Hx   . Diabetes Neg Hx   . Cancer Neg Hx        prostate or colon  . Colon cancer Neg Hx   . Rectal cancer Neg Hx   . Stomach cancer Neg Hx      Social History   Socioeconomic History  . Marital status: Married    Spouse name: Not on file  . Number of children: Not on file  . Years of education: Not on file  . Highest education level: Not on file  Occupational History  . Occupation: retired Ship broker  Employer: RETIRED  Social Needs  . Financial resource strain: Not on file  . Food insecurity:    Worry: Not on file    Inability: Not on file  . Transportation needs:    Medical: Not on file    Non-medical: Not on file  Tobacco Use  . Smoking status: Never Smoker  . Smokeless tobacco: Never Used  Substance and Sexual Activity  . Alcohol use: Yes    Comment: occ.  . Drug use: Never  . Sexual activity: Not on file  Lifestyle  . Physical activity:    Days per week: Not on file    Minutes per session: Not on file  . Stress: Not on file  Relationships  . Social connections:    Talks on phone: Not on file    Gets together: Not on file    Attends religious service: Not on file    Active member of club or organization: Not on file    Attends meetings of clubs or organizations: Not on file    Relationship status: Not on file  . Intimate partner violence:    Fear of current or ex partner: Not on file    Emotionally  abused: Not on file    Physically abused: Not on file    Forced sexual activity: Not on file  Other Topics Concern  . Not on file  Social History Narrative   No living will--plans to do this.   Wife should make health care decisions for him--alternate is son Rodney Gray   Would accept resuscitation attempts   Would accept tube feeds for some time--depending on prognosis     BP 124/64   Pulse 74   Ht 5\' 7"  (1.702 m)   Wt 187 lb (84.8 kg)   BMI 29.29 kg/m   Physical Exam:  Well appearing NAD HEENT: Unremarkable Neck:  No JVD, no thyromegally Lymphatics:  No adenopathy Back:  No CVA tenderness Lungs:  Clear with no wheezes HEART:  IRegular rate rhythm, no murmurs, no rubs, no clicks Abd:  soft, positive bowel sounds, no organomegally, no rebound, no guarding Ext:  2 plus pulses, no edema, no cyanosis, no clubbing Skin:  No rashes no nodules Neuro:  CN II through XII intact, motor grossly intact  EKG - atrial fib with a controlled VR   Assess/Plan: 1. Atrial fib - his rates are controlled. He has a CHADSVASC score of 1. He will continue his cardizem for rate control. 2. Bleeding risk - I discussed bleeding complications with low dose asa and concomittant prednisone. His dose is low and I asked him to watch for any GI bleeding.   Mikle [email or web address].D.

## 2018-05-09 NOTE — Patient Instructions (Signed)

## 2018-05-10 IMAGING — DX DG CHEST 2V
2 series · 2 of 2 positions shown · non-contrast
Comparison: Radiographs May 01, 2009.

CLINICAL DATA: Chest pain.

EXAM:
CHEST - 2 VIEW

[chest pa]
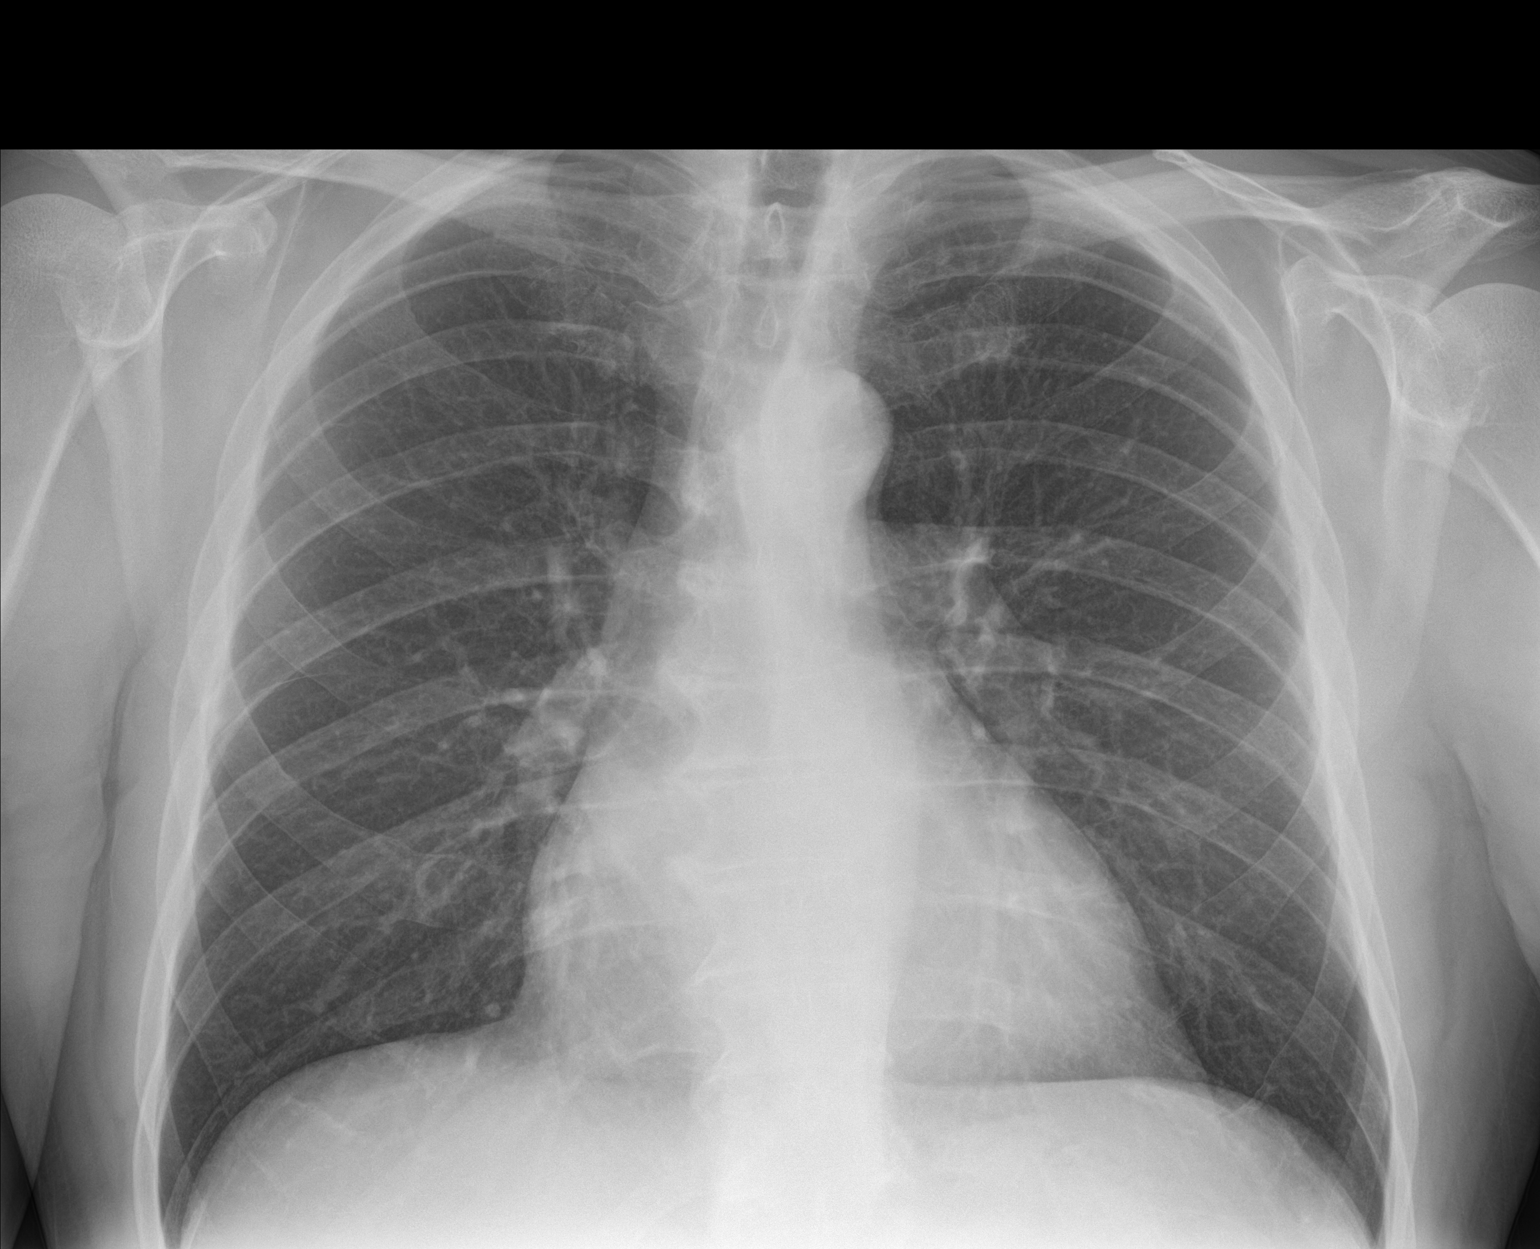

[chest lat]
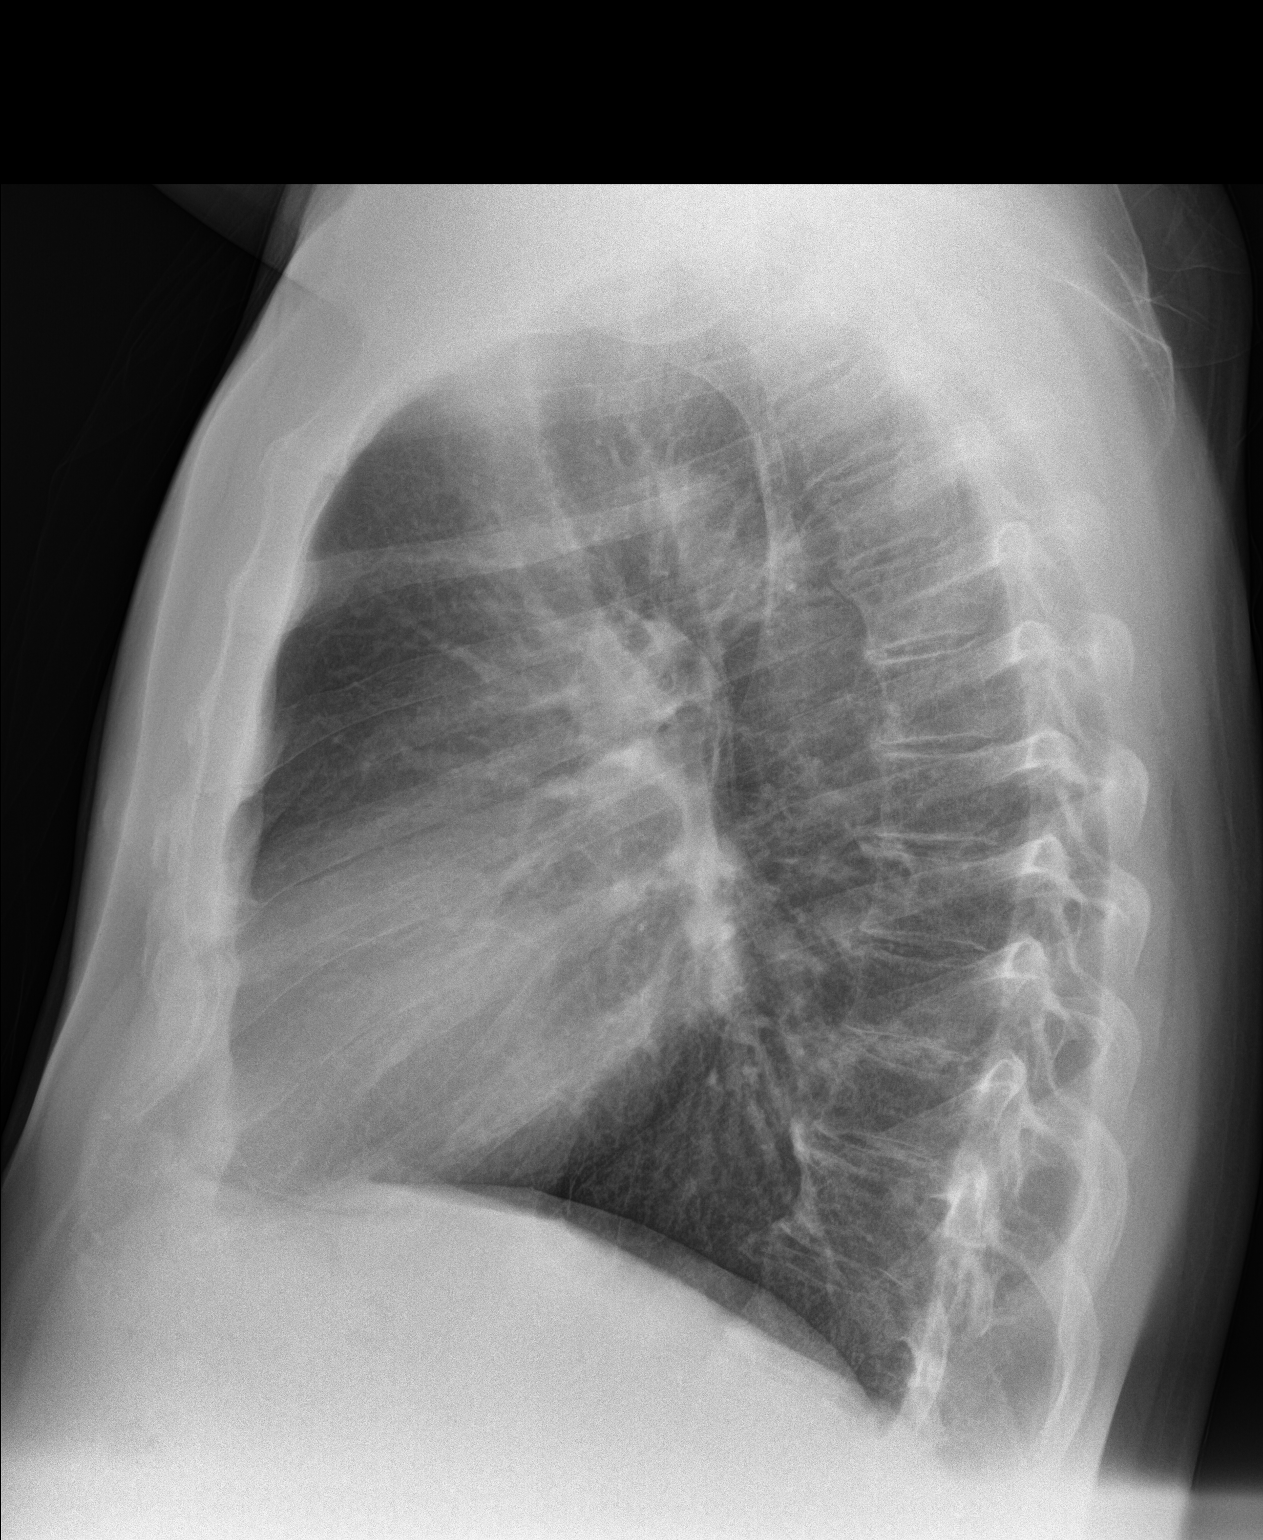

[2 of 2 positions shown; findings below may reference images not displayed]

FINDINGS: The heart size and mediastinal contours are within normal limits.
Both lungs are clear. No pneumothorax or pleural effusion is noted.
The visualized skeletal structures are unremarkable.
IMPRESSION: No active cardiopulmonary disease.

## 2018-05-25 DIAGNOSIS — M9903 Segmental and somatic dysfunction of lumbar region: Secondary | ICD-10-CM | POA: Diagnosis not present

## 2018-05-25 DIAGNOSIS — M9905 Segmental and somatic dysfunction of pelvic region: Secondary | ICD-10-CM | POA: Diagnosis not present

## 2018-05-25 DIAGNOSIS — M5136 Other intervertebral disc degeneration, lumbar region: Secondary | ICD-10-CM | POA: Diagnosis not present

## 2018-05-25 DIAGNOSIS — M955 Acquired deformity of pelvis: Secondary | ICD-10-CM | POA: Diagnosis not present

## 2018-07-18 DIAGNOSIS — M9905 Segmental and somatic dysfunction of pelvic region: Secondary | ICD-10-CM | POA: Diagnosis not present

## 2018-07-18 DIAGNOSIS — M9903 Segmental and somatic dysfunction of lumbar region: Secondary | ICD-10-CM | POA: Diagnosis not present

## 2018-07-18 DIAGNOSIS — M955 Acquired deformity of pelvis: Secondary | ICD-10-CM | POA: Diagnosis not present

## 2018-07-18 DIAGNOSIS — M5136 Other intervertebral disc degeneration, lumbar region: Secondary | ICD-10-CM | POA: Diagnosis not present

## 2018-08-15 DIAGNOSIS — M9905 Segmental and somatic dysfunction of pelvic region: Secondary | ICD-10-CM | POA: Diagnosis not present

## 2018-08-15 DIAGNOSIS — M955 Acquired deformity of pelvis: Secondary | ICD-10-CM | POA: Diagnosis not present

## 2018-08-15 DIAGNOSIS — M5136 Other intervertebral disc degeneration, lumbar region: Secondary | ICD-10-CM | POA: Diagnosis not present

## 2018-08-15 DIAGNOSIS — M9903 Segmental and somatic dysfunction of lumbar region: Secondary | ICD-10-CM | POA: Diagnosis not present

## 2018-09-06 DIAGNOSIS — L570 Actinic keratosis: Secondary | ICD-10-CM | POA: Diagnosis not present

## 2018-09-12 DIAGNOSIS — M5136 Other intervertebral disc degeneration, lumbar region: Secondary | ICD-10-CM | POA: Diagnosis not present

## 2018-09-12 DIAGNOSIS — M9903 Segmental and somatic dysfunction of lumbar region: Secondary | ICD-10-CM | POA: Diagnosis not present

## 2018-09-12 DIAGNOSIS — M955 Acquired deformity of pelvis: Secondary | ICD-10-CM | POA: Diagnosis not present

## 2018-09-12 DIAGNOSIS — M9905 Segmental and somatic dysfunction of pelvic region: Secondary | ICD-10-CM | POA: Diagnosis not present

## 2018-10-10 DIAGNOSIS — M9903 Segmental and somatic dysfunction of lumbar region: Secondary | ICD-10-CM | POA: Diagnosis not present

## 2018-10-10 DIAGNOSIS — M5136 Other intervertebral disc degeneration, lumbar region: Secondary | ICD-10-CM | POA: Diagnosis not present

## 2018-10-10 DIAGNOSIS — M955 Acquired deformity of pelvis: Secondary | ICD-10-CM | POA: Diagnosis not present

## 2018-10-10 DIAGNOSIS — M9905 Segmental and somatic dysfunction of pelvic region: Secondary | ICD-10-CM | POA: Diagnosis not present

## 2018-10-11 ENCOUNTER — Other Ambulatory Visit: Payer: Self-pay

## 2018-10-11 ENCOUNTER — Ambulatory Visit (INDEPENDENT_AMBULATORY_CARE_PROVIDER_SITE_OTHER): Payer: Medicare Other | Admitting: Internal Medicine

## 2018-10-11 ENCOUNTER — Encounter: Payer: Self-pay | Admitting: Internal Medicine

## 2018-10-11 VITALS — BP 110/78 | HR 80 | Ht 67.0 in | Wt 184.0 lb

## 2018-10-11 DIAGNOSIS — Z7189 Other specified counseling: Secondary | ICD-10-CM

## 2018-10-11 DIAGNOSIS — Z0000 Encounter for general adult medical examination without abnormal findings: Secondary | ICD-10-CM

## 2018-10-11 DIAGNOSIS — I4821 Permanent atrial fibrillation: Secondary | ICD-10-CM

## 2018-10-11 DIAGNOSIS — Z79899 Other long term (current) drug therapy: Secondary | ICD-10-CM

## 2018-10-11 DIAGNOSIS — M353 Polymyalgia rheumatica: Secondary | ICD-10-CM

## 2018-10-11 DIAGNOSIS — N401 Enlarged prostate with lower urinary tract symptoms: Secondary | ICD-10-CM

## 2018-10-11 DIAGNOSIS — Z13220 Encounter for screening for lipoid disorders: Secondary | ICD-10-CM | POA: Diagnosis not present

## 2018-10-11 DIAGNOSIS — J301 Allergic rhinitis due to pollen: Secondary | ICD-10-CM

## 2018-10-11 DIAGNOSIS — Z125 Encounter for screening for malignant neoplasm of prostate: Secondary | ICD-10-CM | POA: Diagnosis not present

## 2018-10-11 DIAGNOSIS — N138 Other obstructive and reflux uropathy: Secondary | ICD-10-CM | POA: Diagnosis not present

## 2018-10-11 LAB — LIPID PANEL
Cholesterol: 202 mg/dL — ABNORMAL HIGH (ref 0–200)
HDL: 57.3 mg/dL (ref >39.00)
LDL Cholesterol: 122 mg/dL — ABNORMAL HIGH (ref 0–99)
NonHDL: 144.34
Total CHOL/HDL Ratio: 4
Triglycerides: 112 mg/dL (ref 0.0–149.0)
VLDL: 22.4 mg/dL (ref 0.0–40.0)

## 2018-10-11 LAB — CBC
HCT: 44.7 % (ref 39.0–52.0)
Hemoglobin: 14.7 g/dL (ref 13.0–17.0)
MCHC: 32.9 g/dL (ref 30.0–36.0)
MCV: 96.2 fl (ref 78.0–100.0)
Platelets: 249 10*3/uL (ref 150.0–400.0)
RBC: 4.64 Mil/uL (ref 4.22–5.81)
RDW: 13.6 % (ref 11.5–15.5)
WBC: 6.7 10*3/uL (ref 4.0–10.5)

## 2018-10-11 LAB — COMPREHENSIVE METABOLIC PANEL
ALT: 15 U/L (ref 0–53)
AST: 17 U/L (ref 0–37)
Albumin: 4.5 g/dL (ref 3.5–5.2)
Alkaline Phosphatase: 51 U/L (ref 39–117)
BUN: 15 mg/dL (ref 6–23)
CO2: 30 mEq/L (ref 19–32)
Calcium: 9.5 mg/dL (ref 8.4–10.5)
Chloride: 102 mEq/L (ref 96–112)
Creatinine, Ser: 0.89 mg/dL (ref 0.40–1.50)
GFR: 84.95 mL/min (ref >60.00)
Glucose, Bld: 85 mg/dL (ref 70–99)
Potassium: 4.6 mEq/L (ref 3.5–5.1)
Sodium: 140 mEq/L (ref 135–145)
Total Bilirubin: 1.2 mg/dL (ref 0.2–1.2)
Total Protein: 6.7 g/dL (ref 6.0–8.3)

## 2018-10-11 LAB — SEDIMENTATION RATE: Sed Rate: 7 mm/hr (ref 0–20)

## 2018-10-11 LAB — PSA, MEDICARE: PSA: 1.24 ng/ml (ref 0.10–4.00)

## 2018-10-11 NOTE — Patient Instructions (Addendum)
Consider trying prednisone 10mg  every other day instead of 5mg  daily (this may have less risk of side effects). If you do well after a month, you can try 7.5mg  every other day, and eventually down to 5mg  every other day

## 2018-10-11 NOTE — Assessment & Plan Note (Signed)
Discussed adjusting to every other day and then trying to wean

## 2018-10-11 NOTE — Assessment & Plan Note (Signed)
I have personally reviewed the Medicare Annual Wellness questionnaire and have noted 1. The patient's medical and social history 2. Their use of alcohol, tobacco or illicit drugs 3. Their current medications and supplements 4. The patient's functional ability including ADL's, fall risks, home safety risks and hearing or visual             impairment. 5. Diet and physical activities 6. Evidence for depression or mood disorders  The patients weight, height, BMI and visual acuity have been recorded in the chart I have made referrals, counseling and provided education to the patient based review of the above and I have provided the pt with a written personalized care plan for preventive services.  I have provided you with a copy of your personalized plan for preventive services. Please take the time to review along with your updated medication list.  Healthy Colon due 2026 Will check PSA after discussion Flu vaccine in the fall Check with pharmacist about shingrix Stays in shape

## 2018-10-11 NOTE — Assessment & Plan Note (Signed)
See social history 

## 2018-10-11 NOTE — Progress Notes (Signed)
Subjective:    Patient ID: Rodney Gray, male    DOB: [birth year], 68 y.o.   MRN: [id number]  HPI Here for Medicare wellness visit and follow up of chronic health conditions Reviewed form and advanced directives Reviewed other doctors Occasional drink of alcohol No tobacco Exercises regularly Vision fine. Has hearing aides---these have helped No falls No depression or anhedonia Independent with instrumental ADLs No sig memory problems  Doing okay with the pandemic Careful with mask and washing hands  Does have some mild urinary symptoms Urgency at times---may have to go back  Seems to eventually empty Nocturia x 2  No myalgias now Didn't tolerate weaning past 5mg  daily Fine now Mild ED, trouble finishing at times  Permanent atrial fibrillation No palpitations No chest pain or SOB Exercise tolerance is stable No edema Still just on ASA daily--may switch at age 21  Some trouble with left 5th finger arthritis No other major issues  Current Outpatient Medications on File Prior to Visit  Medication Sig Dispense Refill  . aspirin EC 81 MG tablet Take 1 tablet (81 mg total) by mouth daily. 90 tablet 3  . diltiazem (CARDIZEM CD) 180 MG 24 hr capsule TAKE 1 CAPSULE BY MOUTH EVERY DAY 90 capsule 3  . montelukast (SINGULAIR) 10 MG tablet Take 10 mg by mouth at bedtime.    . Multiple Vitamins-Minerals (MENS MULTIVITAMIN PLUS PO) Take 1 capsule by mouth daily.     . Omega-3 Fatty Acids (FISH OIL) 1200 MG CAPS Take 1 capsule by mouth daily.    . predniSONE (DELTASONE) 5 MG tablet Take 2 tablets (10 mg total) by mouth every other day. (Patient taking differently: Take 5 mg by mouth every other day. ) 1 tablet 0   No current facility-administered medications on file prior to visit.     Allergies  Allergen Reactions  . Ampicillin     REACTION: hives Has tolerated cephalosporins    Past Medical History:  Diagnosis Date  . Allergic rhinitis due to pollen   .  Atrial fibrillation ([place])    A-Fib, 1987, 2003, 2005, EPS RX 2005 (A-Fib reocurred)  . BPH (benign prostatic hypertrophy)   . Cancer ([place])    skin CA- squamous and Basal cell  . Cataract   . Hyperlipemia    no per pt  . Hypertension    no per pt  . Osteoarthritis     Past Surgical History:  Procedure Laterality Date  . CAPSULOTOMY  08/2007   bilaterally  . CATARACT EXTRACTION     both eyes  . COLONOSCOPY    . MENISCUS REPAIR  6/11   left knee---Dr Blackmon  . NASAL SINUS SURGERY  May 2014  . RETINAL TEAR REPAIR CRYOTHERAPY     right 12/06  . RETINAL TEAR REPAIR CRYOTHERAPY     Retinal tear/bleed- Right 12/06  . SQUAMOUS CELL CARCINOMA EXCISION  07/2008   left forearm  . THUMB ARTHROSCOPY  1999    Family History  Problem Relation Age of Onset  . Hypertension Father   . Cirrhosis Father 85  . Alcohol abuse Father   . Esophageal cancer Brother   . Coronary artery disease Neg Hx   . Diabetes Neg Hx   . Cancer Neg Hx        prostate or colon  . Colon cancer Neg Hx   . Rectal cancer Neg Hx   . Stomach cancer Neg Hx     Social History   Socioeconomic History  .  Marital status: Married    Spouse name: Not on file  . Number of children: Not on file  . Years of education: Not on file  . Highest education level: Not on file  Occupational History  . Occupation: retired Primary school teacher: RETIRED  Social Needs  . Financial resource strain: Not on file  . Food insecurity    Worry: Not on file    Inability: Not on file  . Transportation needs    Medical: Not on file    Non-medical: Not on file  Tobacco Use  . Smoking status: Never Smoker  . Smokeless tobacco: Never Used  Substance and Sexual Activity  . Alcohol use: Yes    Comment: occ.  . Drug use: Never  . Sexual activity: Not on file  Lifestyle  . Physical activity    Days per week: Not on file    Minutes per session: Not on file  . Stress: Not on file  Relationships  . Social  Herbalist on phone: Not on file    Gets together: Not on file    Attends religious service: Not on file    Active member of club or organization: Not on file    Attends meetings of clubs or organizations: Not on file    Relationship status: Not on file  . Intimate partner violence    Fear of current or ex partner: Not on file    Emotionally abused: Not on file    Physically abused: Not on file    Forced sexual activity: Not on file  Other Topics Concern  . Not on file  Social History Narrative   No living will--plans to do this.   Wife should make health care decisions for him--alternate is son [name]   Would accept resuscitation attempts   Would accept tube feeds for some time--depending on prognosis   Review of Systems Appetite is good Weight is stable Sleep is not great---better since on the singulair. Nocturia does affect him some Uses the singulair in grass/tree season Wears seat belt Bowels are fine--no blood Occasional heartburn---uses OTC liquid prn (twice a month only) No rash or suspicious skin lesions now (recent cryotherapy for actinic)     Objective:   Physical Exam  Constitutional: He is oriented to person, place, and time. He appears well-developed. No distress.  HENT:  Mouth/Throat: Oropharynx is clear and moist. No oropharyngeal exudate.  Neck: No thyromegaly present.  Cardiovascular: Normal rate, normal heart sounds and intact distal pulses. Exam reveals no gallop.  No murmur heard. irregular  Respiratory: Effort normal and breath sounds normal. No respiratory distress. He has no wheezes. He has no rales.  GI: Soft.  Musculoskeletal:        General: No tenderness or edema.  Lymphadenopathy:    He has no cervical adenopathy.  Neurological: He is alert and oriented to person, place, and time.  President--- "Daisy Floro, Barack Obama, Bush" [phone] [id number] Recall 3/3  Skin: No rash noted. No erythema.  Psychiatric: He has a  normal mood and affect. His behavior is normal.           Assessment & Plan:

## 2018-10-11 NOTE — Assessment & Plan Note (Signed)
Uses singulair in pollen season

## 2018-10-11 NOTE — Assessment & Plan Note (Signed)
Mild symptoms Not ready for medicaitons

## 2018-10-11 NOTE — Assessment & Plan Note (Signed)
Permanent Rate fine ASA only per cardiology

## 2018-10-12 ENCOUNTER — Other Ambulatory Visit: Payer: Self-pay | Admitting: Internal Medicine

## 2018-11-09 DIAGNOSIS — M9905 Segmental and somatic dysfunction of pelvic region: Secondary | ICD-10-CM | POA: Diagnosis not present

## 2018-11-09 DIAGNOSIS — M5136 Other intervertebral disc degeneration, lumbar region: Secondary | ICD-10-CM | POA: Diagnosis not present

## 2018-11-09 DIAGNOSIS — M9903 Segmental and somatic dysfunction of lumbar region: Secondary | ICD-10-CM | POA: Diagnosis not present

## 2018-11-09 DIAGNOSIS — M955 Acquired deformity of pelvis: Secondary | ICD-10-CM | POA: Diagnosis not present

## 2018-12-12 DIAGNOSIS — M9905 Segmental and somatic dysfunction of pelvic region: Secondary | ICD-10-CM | POA: Diagnosis not present

## 2018-12-12 DIAGNOSIS — M955 Acquired deformity of pelvis: Secondary | ICD-10-CM | POA: Diagnosis not present

## 2018-12-12 DIAGNOSIS — M9903 Segmental and somatic dysfunction of lumbar region: Secondary | ICD-10-CM | POA: Diagnosis not present

## 2018-12-12 DIAGNOSIS — M5136 Other intervertebral disc degeneration, lumbar region: Secondary | ICD-10-CM | POA: Diagnosis not present

## 2019-01-09 ENCOUNTER — Other Ambulatory Visit: Payer: Self-pay

## 2019-01-09 DIAGNOSIS — Z20828 Contact with and (suspected) exposure to other viral communicable diseases: Secondary | ICD-10-CM | POA: Diagnosis not present

## 2019-01-09 DIAGNOSIS — M5136 Other intervertebral disc degeneration, lumbar region: Secondary | ICD-10-CM | POA: Diagnosis not present

## 2019-01-09 DIAGNOSIS — M955 Acquired deformity of pelvis: Secondary | ICD-10-CM | POA: Diagnosis not present

## 2019-01-09 DIAGNOSIS — Z20822 Contact with and (suspected) exposure to covid-19: Secondary | ICD-10-CM

## 2019-01-09 DIAGNOSIS — M9903 Segmental and somatic dysfunction of lumbar region: Secondary | ICD-10-CM | POA: Diagnosis not present

## 2019-01-09 DIAGNOSIS — M9905 Segmental and somatic dysfunction of pelvic region: Secondary | ICD-10-CM | POA: Diagnosis not present

## 2019-01-11 ENCOUNTER — Ambulatory Visit: Payer: Medicare Other

## 2019-01-11 LAB — NOVEL CORONAVIRUS, NAA: SARS-CoV-2, NAA: NOT DETECTED

## 2019-02-07 DIAGNOSIS — M5136 Other intervertebral disc degeneration, lumbar region: Secondary | ICD-10-CM | POA: Diagnosis not present

## 2019-02-07 DIAGNOSIS — M9905 Segmental and somatic dysfunction of pelvic region: Secondary | ICD-10-CM | POA: Diagnosis not present

## 2019-02-07 DIAGNOSIS — M9903 Segmental and somatic dysfunction of lumbar region: Secondary | ICD-10-CM | POA: Diagnosis not present

## 2019-02-07 DIAGNOSIS — M955 Acquired deformity of pelvis: Secondary | ICD-10-CM | POA: Diagnosis not present

## 2019-02-17 ENCOUNTER — Other Ambulatory Visit: Payer: Self-pay | Admitting: Internal Medicine

## 2019-02-27 ENCOUNTER — Other Ambulatory Visit: Payer: Self-pay

## 2019-03-07 DIAGNOSIS — M955 Acquired deformity of pelvis: Secondary | ICD-10-CM | POA: Diagnosis not present

## 2019-03-07 DIAGNOSIS — M9905 Segmental and somatic dysfunction of pelvic region: Secondary | ICD-10-CM | POA: Diagnosis not present

## 2019-03-07 DIAGNOSIS — M5136 Other intervertebral disc degeneration, lumbar region: Secondary | ICD-10-CM | POA: Diagnosis not present

## 2019-03-07 DIAGNOSIS — M9903 Segmental and somatic dysfunction of lumbar region: Secondary | ICD-10-CM | POA: Diagnosis not present

## 2019-03-23 ENCOUNTER — Encounter: Payer: Self-pay | Admitting: Gastroenterology

## 2019-04-30 ENCOUNTER — Encounter: Payer: Self-pay | Admitting: Gastroenterology

## 2019-04-30 ENCOUNTER — Ambulatory Visit (INDEPENDENT_AMBULATORY_CARE_PROVIDER_SITE_OTHER): Payer: Medicare Other | Admitting: Gastroenterology

## 2019-04-30 VITALS — BP 124/64 | HR 96 | Temp 99.1°F | Ht 67.5 in | Wt 189.4 lb

## 2019-04-30 DIAGNOSIS — K629 Disease of anus and rectum, unspecified: Secondary | ICD-10-CM

## 2019-04-30 NOTE — Progress Notes (Signed)
[facility] Consult Note:  History: Rodney Gray 04/30/2019  Referring provider: Venia Carbon, MD  Reason for consult/chief complaint: rectal knot (? hemorrhoid, felt a knot in Dec but none today)   Subjective  HPI:  Rodney Gray was referred by primary care for concern about a perianal lesion.  Last month he felt a "knot" on the anal canal toward the posterior aspect.  He noticed it while he was cleaning his shower.  It was there for 10 to 14 days and then went away.  He denies anorectal pain, abdominal pain, altered bowel habits or rectal bleeding.  Last colonoscopy Olevia Perches) 05/2014 - normal except for mild left-sided diverticulosis.  ROS:  Review of Systems  He denies chest pain dyspnea or dysuria  Past Medical History: Past Medical History:  Diagnosis Date  . Allergic rhinitis due to pollen   . Atrial fibrillation ([place])    A-Fib, 1987, 2003, 2005, EPS RX 2005 (A-Fib reocurred)  . Basal cell carcinoma    skin CA- squamous and Basal cell  . BPH (benign prostatic hypertrophy)   . Cataract   . Hyperlipemia    no per pt  . Hypertension    no per pt  . Osteoarthritis   . Polymyalgia rheumatica ([place])   . Squamous cell carcinoma in situ      Past Surgical History: Past Surgical History:  Procedure Laterality Date  . CAPSULOTOMY Bilateral 08/2007  . CATARACT EXTRACTION Bilateral    both eyes  . COLONOSCOPY    . MENISCUS REPAIR Left 09/2009   left knee---Dr Blackmon  . NASAL SINUS SURGERY  May 2014  . RETINAL TEAR REPAIR CRYOTHERAPY Right    right 12/06  . RETINAL TEAR REPAIR CRYOTHERAPY Right    Retinal tear/bleed- Right 12/06  . SQUAMOUS CELL CARCINOMA EXCISION Left 07/2008   left forearm  . THUMB ARTHROSCOPY Left 1999     Family History: Family History  Problem Relation Age of Onset  . Hypertension Father   . Cirrhosis Father 70  . Alcohol abuse Father   . Gallstones Mother   . Hyperlipidemia Mother   . Esophageal  cancer Brother   . Asthma Daughter   . Coronary artery disease Neg Hx   . Diabetes Neg Hx   . Cancer Neg Hx        prostate or colon  . Colon cancer Neg Hx   . Rectal cancer Neg Hx   . Stomach cancer Neg Hx     Social History: Social History   Socioeconomic History  . Marital status: Married    Spouse name: Not on file  . Number of children: 3  . Years of education: Not on file  . Highest education level: Not on file  Occupational History  . Occupation: retired Primary school teacher: RETIRED  Tobacco Use  . Smoking status: Never Smoker  . Smokeless tobacco: Never Used  Substance and Sexual Activity  . Alcohol use: Yes    Comment: occ. 3 per month  . Drug use: Never  . Sexual activity: Not on file  Other Topics Concern  . Not on file  Social History Narrative   Has living will   Wife should make health care decisions for him--alternate is son [name]   Would accept resuscitation attempts   Would accept tube feeds for some time--depending on prognosis   Social Determinants of Health   Financial Resource Strain:   . Difficulty of Paying Living Expenses:  Not on file  Food Insecurity:   . Worried About Charity fundraiser in the Last Year: Not on file  . Ran Out of Food in the Last Year: Not on file  Transportation Needs:   . Lack of Transportation (Medical): Not on file  . Lack of Transportation (Non-Medical): Not on file  Physical Activity:   . Days of Exercise per Week: Not on file  . Minutes of Exercise per Session: Not on file  Stress:   . Feeling of Stress : Not on file  Social Connections:   . Frequency of Communication with Friends and Family: Not on file  . Frequency of Social Gatherings with Friends and Family: Not on file  . Attends Religious Services: Not on file  . Active Member of Clubs or Organizations: Not on file  . Attends Archivist Meetings: Not on file  . Marital Status: Not on file    Allergies: Allergies    Allergen Reactions  . Ampicillin     REACTION: hives Has tolerated cephalosporins    Outpatient Meds: Current Outpatient Medications  Medication Sig Dispense Refill  . aspirin EC 81 MG tablet Take 1 tablet (81 mg total) by mouth daily. 90 tablet 3  . diltiazem (CARDIZEM CD) 180 MG 24 hr capsule TAKE 1 CAPSULE BY MOUTH EVERY DAY. Please make yearly appt with Dr. Lovena Le for February for future refills. 1st attempt 90 capsule 0  . montelukast (SINGULAIR) 10 MG tablet Take 10 mg by mouth as needed.     . Multiple Vitamins-Minerals (MENS MULTIVITAMIN PLUS PO) Take 1 capsule by mouth daily.     . Omega-3 Fatty Acids (FISH OIL) 1200 MG CAPS Take 1 capsule by mouth daily.    . predniSONE (DELTASONE) 5 MG tablet Take 2 tablets (10 mg total) by mouth every other day. Wean as directed 100 tablet 3  . Probiotic Product (DIGESTIVE ADVANTAGE GUMMIES PO) Take 1-2 tablets by mouth daily.     No current facility-administered medications for this visit.      ___________________________________________________________________ Objective   Exam:  BP 124/64 (BP Location: Left Arm, Patient Position: Sitting, Cuff Size: Normal)   Pulse 96 Comment: irregular  Temp 99.1 F (37.3 C)   Ht 5' 7.5" (1.715 m) Comment: height measured without shoes  Wt 189 lb 6 oz (85.9 kg)   BMI 29.22 kg/m    General: Well-appearing  CV: Irregularly irregular without murmur, S1/S2, no JVD, no peripheral edema  Resp: clear to auscultation bilaterally, normal RR and effort noted  GI: soft, no tenderness, with active bowel sounds. No guarding or palpable organomegaly noted.  Skin; warm and dry, no rash or jaundice noted  Neuro: awake, alert and oriented x 3. Normal gross motor function and fluent speech Rectal: Normal perianal exam, no tenderness, fissure palpable anal or distal rectal lesions Anoscopy: Normal exam.  Assessment: Encounter Diagnosis  Name Primary?  Marland Kitchen Anal lesion Yes    Nothing seen on exam  today.  Probably had mild focal edema or inflammation of posterior external hemorrhoidal plexus.  Plan:  Follow-up as needed.  Thank you for the courtesy of this consult.  Please call me with any questions or concerns.  Nelida Meuse III  CC: Referring provider noted above

## 2019-04-30 NOTE — Patient Instructions (Signed)
If you are age 69 or older, your body mass index should be between 23-30. Your Body mass index is 29.22 kg/m. If this is out of the aforementioned range listed, please consider follow up with your Primary Care Provider.  If you are age 30 or younger, your body mass index should be between 19-25. Your Body mass index is 29.22 kg/m. If this is out of the aformentioned range listed, please consider follow up with your Primary Care Provider.   Follow up as needed. [phone]  It was a pleasure to see you today!  Dr. Loletha Carrow

## 2019-05-17 ENCOUNTER — Encounter: Payer: Self-pay | Admitting: Internal Medicine

## 2019-05-17 ENCOUNTER — Other Ambulatory Visit: Payer: Self-pay

## 2019-05-17 ENCOUNTER — Ambulatory Visit (INDEPENDENT_AMBULATORY_CARE_PROVIDER_SITE_OTHER): Payer: Medicare Other | Admitting: Internal Medicine

## 2019-05-17 VITALS — BP 130/78 | HR 66 | Ht 67.5 in | Wt 189.4 lb

## 2019-05-17 DIAGNOSIS — I4821 Permanent atrial fibrillation: Secondary | ICD-10-CM

## 2019-05-17 NOTE — Progress Notes (Signed)
HPI Rodney Gray returns today for ongoing evaluation and management of chronic atrial fib. He is a pleasant 69 yo man who has had rate controlled atrial fib for over 10 years. He is asymptomatic. He does not have palpitations. He has been active and denies chest pain or sob with exertion. He was diagnosed with PMR over a year ago and has been on low dose prednisone. His bp is well controlled at home.  Allergies  Allergen Reactions  . Ampicillin     REACTION: hives Has tolerated cephalosporins     Current Outpatient Medications  Medication Sig Dispense Refill  . aspirin EC 81 MG tablet Take 1 tablet (81 mg total) by mouth daily. 90 tablet 3  . clindamycin (CLEOCIN) 150 MG capsule Take 4 capsules by mouth as needed. Prior to dental appointments    . diltiazem (CARDIZEM CD) 180 MG 24 hr capsule TAKE 1 CAPSULE BY MOUTH EVERY DAY. Please make yearly appt with Dr. Lovena Le for February for future refills. 1st attempt 90 capsule 0  . montelukast (SINGULAIR) 10 MG tablet Take 10 mg by mouth as needed. Allergies    . Multiple Vitamins-Minerals (MENS MULTIVITAMIN PLUS PO) Take 1 capsule by mouth daily.     . Omega-3 Fatty Acids (FISH OIL) 1200 MG CAPS Take 1 capsule by mouth daily.    . predniSONE (DELTASONE) 5 MG tablet Take 2 tablets (10 mg total) by mouth every other day. Wean as directed 100 tablet 3  . Probiotic Product (DIGESTIVE ADVANTAGE GUMMIES PO) Take 1-2 tablets by mouth daily.     No current facility-administered medications for this visit.     Past Medical History:  Diagnosis Date  . Allergic rhinitis due to pollen   . Atrial fibrillation ([place])    A-Fib, 1987, 2003, 2005, EPS RX 2005 (A-Fib reocurred)  . Basal cell carcinoma    skin CA- squamous and Basal cell  . BPH (benign prostatic hypertrophy)   . Cataract   . Hyperlipemia    no per pt  . Hypertension    no per pt  . Osteoarthritis   . Polymyalgia rheumatica ([place])   . Squamous cell carcinoma in situ      ROS:   All systems reviewed and negative except as noted in the HPI.   Past Surgical History:  Procedure Laterality Date  . CAPSULOTOMY Bilateral 08/2007  . CATARACT EXTRACTION Bilateral    both eyes  . COLONOSCOPY    . MENISCUS REPAIR Left 09/2009   left knee---Dr Blackmon  . NASAL SINUS SURGERY  May 2014  . RETINAL TEAR REPAIR CRYOTHERAPY Right    right 12/06  . RETINAL TEAR REPAIR CRYOTHERAPY Right    Retinal tear/bleed- Right 12/06  . SQUAMOUS CELL CARCINOMA EXCISION Left 07/2008   left forearm  . THUMB ARTHROSCOPY Left 1999     Family History  Problem Relation Age of Onset  . Hypertension Father   . Cirrhosis Father 57  . Alcohol abuse Father   . Gallstones Mother   . Hyperlipidemia Mother   . Esophageal cancer Brother   . Asthma Daughter   . Coronary artery disease Neg Hx   . Diabetes Neg Hx   . Cancer Neg Hx        prostate or colon  . Colon cancer Neg Hx   . Rectal cancer Neg Hx   . Stomach cancer Neg Hx      Social History   Socioeconomic History  . Marital  status: Married    Spouse name: Not on file  . Number of children: 3  . Years of education: Not on file  . Highest education level: Not on file  Occupational History  . Occupation: retired Primary school teacher: RETIRED  Tobacco Use  . Smoking status: Never Smoker  . Smokeless tobacco: Never Used  Substance and Sexual Activity  . Alcohol use: Yes    Comment: occ. 3 per month  . Drug use: Never  . Sexual activity: Not on file  Other Topics Concern  . Not on file  Social History Narrative   Has living will   Wife should make health care decisions for him--alternate is son [name]   Would accept resuscitation attempts   Would accept tube feeds for some time--depending on prognosis   Social Determinants of Health   Financial Resource Strain:   . Difficulty of Paying Living Expenses: Not on file  Food Insecurity:   . Worried About Charity fundraiser in the Last  Year: Not on file  . Ran Out of Food in the Last Year: Not on file  Transportation Needs:   . Lack of Transportation (Medical): Not on file  . Lack of Transportation (Non-Medical): Not on file  Physical Activity:   . Days of Exercise per Week: Not on file  . Minutes of Exercise per Session: Not on file  Stress:   . Feeling of Stress : Not on file  Social Connections:   . Frequency of Communication with Friends and Family: Not on file  . Frequency of Social Gatherings with Friends and Family: Not on file  . Attends Religious Services: Not on file  . Active Member of Clubs or Organizations: Not on file  . Attends Archivist Meetings: Not on file  . Marital Status: Not on file  Intimate Partner Violence:   . Fear of Current or Ex-Partner: Not on file  . Emotionally Abused: Not on file  . Physically Abused: Not on file  . Sexually Abused: Not on file     BP 130/78   Pulse 66   Ht 5' 7.5" (1.715 m)   Wt 189 lb 6.4 oz (85.9 kg)   SpO2 99%   BMI 29.23 kg/m   Physical Exam:  Well appearing NAD HEENT: Unremarkable Neck:  No JVD, no thyromegally Lymphatics:  No adenopathy Back:  No CVA tenderness Lungs:  Clear with no wheezes HEART:  IRegular rate rhythm, no murmurs, no rubs, no clicks Abd:  soft, positive bowel sounds, no organomegally, no rebound, no guarding Ext:  2 plus pulses, no edema, no cyanosis, no clubbing Skin:  No rashes no nodules Neuro:  CN II through XII intact, motor grossly intact  EKG - atrial fib with a controlled VR   Assess/Plan: 1. Chronic atrial fib - his rates are well controlled. His CHADSVASC score remains 1. He does not have a diagnosis of HTN although his bp has been up a bit on steroids. I asked him to check his bp at home and call if his bp is over 130.   Mikle Bosworth.D.

## 2019-05-17 NOTE — Patient Instructions (Signed)

## 2019-05-30 DIAGNOSIS — M955 Acquired deformity of pelvis: Secondary | ICD-10-CM | POA: Diagnosis not present

## 2019-05-30 DIAGNOSIS — M9905 Segmental and somatic dysfunction of pelvic region: Secondary | ICD-10-CM | POA: Diagnosis not present

## 2019-05-30 DIAGNOSIS — M5136 Other intervertebral disc degeneration, lumbar region: Secondary | ICD-10-CM | POA: Diagnosis not present

## 2019-05-30 DIAGNOSIS — M9903 Segmental and somatic dysfunction of lumbar region: Secondary | ICD-10-CM | POA: Diagnosis not present

## 2019-07-08 ENCOUNTER — Other Ambulatory Visit: Payer: Self-pay | Admitting: Internal Medicine

## 2019-07-11 DIAGNOSIS — M5136 Other intervertebral disc degeneration, lumbar region: Secondary | ICD-10-CM | POA: Diagnosis not present

## 2019-07-11 DIAGNOSIS — M955 Acquired deformity of pelvis: Secondary | ICD-10-CM | POA: Diagnosis not present

## 2019-07-11 DIAGNOSIS — M9905 Segmental and somatic dysfunction of pelvic region: Secondary | ICD-10-CM | POA: Diagnosis not present

## 2019-07-11 DIAGNOSIS — M9903 Segmental and somatic dysfunction of lumbar region: Secondary | ICD-10-CM | POA: Diagnosis not present

## 2019-08-08 DIAGNOSIS — M955 Acquired deformity of pelvis: Secondary | ICD-10-CM | POA: Diagnosis not present

## 2019-08-08 DIAGNOSIS — M9903 Segmental and somatic dysfunction of lumbar region: Secondary | ICD-10-CM | POA: Diagnosis not present

## 2019-08-08 DIAGNOSIS — M5136 Other intervertebral disc degeneration, lumbar region: Secondary | ICD-10-CM | POA: Diagnosis not present

## 2019-08-08 DIAGNOSIS — M9905 Segmental and somatic dysfunction of pelvic region: Secondary | ICD-10-CM | POA: Diagnosis not present

## 2019-08-15 ENCOUNTER — Other Ambulatory Visit: Payer: Self-pay

## 2019-08-15 MED ORDER — DILTIAZEM HCL ER COATED BEADS 180 MG PO CP24: ORAL_CAPSULE | ORAL | 2 refills | Status: DC

## 2019-08-16 DIAGNOSIS — H903 Sensorineural hearing loss, bilateral: Secondary | ICD-10-CM | POA: Diagnosis not present

## 2019-08-16 MED ORDER — DILTIAZEM HCL ER COATED BEADS 180 MG PO CP24: ORAL_CAPSULE | ORAL | 2 refills | Status: DC

## 2019-08-16 NOTE — Addendum Note (Signed)
Addended by: Derl Barrow on: 08/16/2019 11:22 AM   Modules accepted: Orders

## 2019-09-05 DIAGNOSIS — M9905 Segmental and somatic dysfunction of pelvic region: Secondary | ICD-10-CM | POA: Diagnosis not present

## 2019-09-05 DIAGNOSIS — M9903 Segmental and somatic dysfunction of lumbar region: Secondary | ICD-10-CM | POA: Diagnosis not present

## 2019-09-05 DIAGNOSIS — M955 Acquired deformity of pelvis: Secondary | ICD-10-CM | POA: Diagnosis not present

## 2019-09-05 DIAGNOSIS — M5136 Other intervertebral disc degeneration, lumbar region: Secondary | ICD-10-CM | POA: Diagnosis not present

## 2019-10-09 ENCOUNTER — Other Ambulatory Visit: Payer: Self-pay | Admitting: Internal Medicine

## 2019-10-15 ENCOUNTER — Other Ambulatory Visit: Payer: Self-pay

## 2019-10-16 ENCOUNTER — Encounter: Payer: Self-pay | Admitting: Internal Medicine

## 2019-10-16 ENCOUNTER — Ambulatory Visit (INDEPENDENT_AMBULATORY_CARE_PROVIDER_SITE_OTHER): Payer: Medicare Other | Admitting: Internal Medicine

## 2019-10-16 VITALS — BP 110/80 | HR 61 | Temp 98.1°F | Ht 67.75 in | Wt 183.0 lb

## 2019-10-16 DIAGNOSIS — Z7189 Other specified counseling: Secondary | ICD-10-CM | POA: Diagnosis not present

## 2019-10-16 DIAGNOSIS — Z7952 Long term (current) use of systemic steroids: Secondary | ICD-10-CM

## 2019-10-16 DIAGNOSIS — Z79899 Other long term (current) drug therapy: Secondary | ICD-10-CM

## 2019-10-16 DIAGNOSIS — Z0000 Encounter for general adult medical examination without abnormal findings: Secondary | ICD-10-CM

## 2019-10-16 DIAGNOSIS — I4821 Permanent atrial fibrillation: Secondary | ICD-10-CM

## 2019-10-16 DIAGNOSIS — M353 Polymyalgia rheumatica: Secondary | ICD-10-CM

## 2019-10-16 DIAGNOSIS — N138 Other obstructive and reflux uropathy: Secondary | ICD-10-CM | POA: Diagnosis not present

## 2019-10-16 DIAGNOSIS — T380X5S Adverse effect of glucocorticoids and synthetic analogues, sequela: Secondary | ICD-10-CM

## 2019-10-16 DIAGNOSIS — N401 Enlarged prostate with lower urinary tract symptoms: Secondary | ICD-10-CM

## 2019-10-16 LAB — COMPREHENSIVE METABOLIC PANEL
ALT: 13 U/L (ref 0–53)
AST: 16 U/L (ref 0–37)
Albumin: 4.4 g/dL (ref 3.5–5.2)
Alkaline Phosphatase: 43 U/L (ref 39–117)
BUN: 14 mg/dL (ref 6–23)
CO2: 30 mEq/L (ref 19–32)
Calcium: 9.3 mg/dL (ref 8.4–10.5)
Chloride: 103 mEq/L (ref 96–112)
Creatinine, Ser: 0.86 mg/dL (ref 0.40–1.50)
GFR: 88.11 mL/min (ref >60.00)
Glucose, Bld: 90 mg/dL (ref 70–99)
Potassium: 4.4 mEq/L (ref 3.5–5.1)
Sodium: 138 mEq/L (ref 135–145)
Total Bilirubin: 1 mg/dL (ref 0.2–1.2)
Total Protein: 6.4 g/dL (ref 6.0–8.3)

## 2019-10-16 LAB — CBC
HCT: 42 % (ref 39.0–52.0)
Hemoglobin: 14.2 g/dL (ref 13.0–17.0)
MCHC: 33.7 g/dL (ref 30.0–36.0)
MCV: 95.9 fl (ref 78.0–100.0)
Platelets: 245 10*3/uL (ref 150.0–400.0)
RBC: 4.38 Mil/uL (ref 4.22–5.81)
RDW: 13.4 % (ref 11.5–15.5)
WBC: 6.7 10*3/uL (ref 4.0–10.5)

## 2019-10-16 LAB — SEDIMENTATION RATE: Sed Rate: 4 mm/hr (ref 0–20)

## 2019-10-16 NOTE — Patient Instructions (Signed)
Please call to set up your bone density test

## 2019-10-16 NOTE — Assessment & Plan Note (Signed)
Permanent No sig symptoms On ASA still---diltiazem

## 2019-10-16 NOTE — Progress Notes (Signed)
Subjective:    Patient ID: Rodney Gray, male    DOB: [birth year], 69 y.o.   MRN: [id number]  HPI Here for Medicare wellness visit and follow up of chronic health conditions This visit occurred during the SARS-CoV-2 public health emergency.  Safety protocols were in place, including screening questions prior to the visit, additional usage of staff PPE, and extensive cleaning of exam room while observing appropriate contact time as indicated for disinfecting solutions.   Reviewed form and advanced directives Reviewed other doctors No tobacco Occasional drink of alcohol Exercises regularly Vision is okay Has hearing aides No falls No depression or anhedonia Independent with instrumental ADLs No sig memory issues  Feels okay Stays in shape for the most part--walking and weights  Now taking prednisone 10mg  every other day Hasn't tried the 7.5mg  as yet--afraid of relapse No aching now  Still on ASA only for atrial fib Will likely change next year at 25 Continues on the diltiazem No chest pain--other than gas pain No SOB No dizziness or syncope No palpitations---can feel his pulse is irregular  Voiding reasonably well Has some urgency Nocturia x1 generally (occ 2) Considering OTC med  Hasn't needed the montelukast lately Uses seasonally  Current Outpatient Medications on File Prior to Visit  Medication Sig Dispense Refill  . aspirin EC 81 MG tablet Take 1 tablet (81 mg total) by mouth daily. 90 tablet 3  . clindamycin (CLEOCIN) 150 MG capsule Take 4 capsules by mouth as needed. Prior to dental appointments    . diltiazem (CARDIZEM CD) 180 MG 24 hr capsule TAKE 1 CAPSULE BY MOUTH EVERY DAY. 90 capsule 2  . montelukast (SINGULAIR) 10 MG tablet Take 10 mg by mouth as needed. Allergies    . Multiple Vitamins-Minerals (MENS MULTIVITAMIN PLUS PO) Take 1 capsule by mouth daily.     . Omega-3 Fatty Acids (FISH OIL) 1200 MG CAPS Take 1 capsule by mouth daily.    .  predniSONE (DELTASONE) 5 MG tablet TAKE 2 TABLETS (10 MG TOTAL) BY MOUTH EVERY OTHER DAY. WEAN AS DIRECTED 100 tablet 3  . Probiotic Product (DIGESTIVE ADVANTAGE GUMMIES PO) Take 1-2 tablets by mouth daily.     No current facility-administered medications on file prior to visit.    Allergies  Allergen Reactions  . Ampicillin     REACTION: hives Has tolerated cephalosporins    Past Medical History:  Diagnosis Date  . Allergic rhinitis due to pollen   . Atrial fibrillation ([place])    A-Fib, 1987, 2003, 2005, EPS RX 2005 (A-Fib reocurred)  . Basal cell carcinoma    skin CA- squamous and Basal cell  . BPH (benign prostatic hypertrophy)   . Cataract   . Hyperlipemia    no per pt  . Hypertension    no per pt  . Osteoarthritis   . Polymyalgia rheumatica ([place])   . Squamous cell carcinoma in situ     Past Surgical History:  Procedure Laterality Date  . CAPSULOTOMY Bilateral 08/2007  . CATARACT EXTRACTION Bilateral    both eyes  . COLONOSCOPY    . MENISCUS REPAIR Left 09/2009   left knee---Dr Blackmon  . NASAL SINUS SURGERY  May 2014  . RETINAL TEAR REPAIR CRYOTHERAPY Right    right 12/06  . RETINAL TEAR REPAIR CRYOTHERAPY Right    Retinal tear/bleed- Right 12/06  . SQUAMOUS CELL CARCINOMA EXCISION Left 07/2008   left forearm  . THUMB ARTHROSCOPY Left 1999    Family History  Problem  Relation Age of Onset  . Hypertension Father   . Cirrhosis Father 86  . Alcohol abuse Father   . Gallstones Mother   . Hyperlipidemia Mother   . Esophageal cancer Brother   . Asthma Daughter   . Coronary artery disease Neg Hx   . Diabetes Neg Hx   . Cancer Neg Hx        prostate or colon  . Colon cancer Neg Hx   . Rectal cancer Neg Hx   . Stomach cancer Neg Hx     Social History   Socioeconomic History  . Marital status: Married    Spouse name: Not on file  . Number of children: 3  . Years of education: Not on file  . Highest education level: Not on file  Occupational History   . Occupation: retired Primary school teacher: RETIRED  Tobacco Use  . Smoking status: Never Smoker  . Smokeless tobacco: Never Used  Vaping Use  . Vaping Use: Never used  Substance and Sexual Activity  . Alcohol use: Yes    Comment: occ. 3 per month  . Drug use: Never  . Sexual activity: Not on file  Other Topics Concern  . Not on file  Social History Narrative   Has living will   Wife is health care POA--alternate is son [name]   Would accept resuscitation attempts   Would accept tube feeds for some time--depending on prognosis   Social Determinants of Health   Financial Resource Strain:   . Difficulty of Paying Living Expenses:   Food Insecurity:   . Worried About Charity fundraiser in the Last Year:   . Arboriculturist in the Last Year:   Transportation Needs:   . Film/video editor (Medical):   Marland Kitchen Lack of Transportation (Non-Medical):   Physical Activity:   . Days of Exercise per Week:   . Minutes of Exercise per Session:   Stress:   . Feeling of Stress :   Social Connections:   . Frequency of Communication with Friends and Family:   . Frequency of Social Gatherings with Friends and Family:   . Attends Religious Services:   . Active Member of Clubs or Organizations:   . Attends Archivist Meetings:   Marland Kitchen Marital Status:   Intimate Partner Violence:   . Fear of Current or Ex-Partner:   . Emotionally Abused:   Marland Kitchen Physically Abused:   . Sexually Abused:    Review of Systems Appetite is good Weight reasonably stable Sleeps fair---occ trouble initiating or maintaining. No major daytime somnolence Wears seat belt Recent dental implant--keeps up with care No suspicious skin lesions Heartburn is less---uses maalox prn. No dysphagia Bowels are good. No blood No sig joint pains. Sees chiropractor monthly (uses ice/tylenol prn)    Objective:   Physical Exam Constitutional:      Appearance: Normal appearance.  HENT:     Mouth/Throat:       Mouth: Mucous membranes are moist.     Comments: No lesions Cardiovascular:     Rate and Rhythm: Normal rate. Rhythm irregular.     Heart sounds: No murmur heard.  No gallop.   Pulmonary:     Effort: Pulmonary effort is normal.     Breath sounds: Normal breath sounds. No wheezing or rales.  Abdominal:     Palpations: Abdomen is soft.     Tenderness: There is no abdominal tenderness.  Musculoskeletal:  Cervical back: Neck supple.     Right lower leg: No edema.     Left lower leg: No edema.  Lymphadenopathy:     Cervical: No cervical adenopathy.  Skin:    General: Skin is warm.     Findings: No rash.  Neurological:     Mental Status: He is alert and oriented to person, place, and time.     Comments: President--- "Zoila Shutter, Obama" [id number] Recall 3/3  Psychiatric:        Mood and Affect: Mood normal.        Behavior: Behavior normal.            Assessment & Plan:

## 2019-10-16 NOTE — Assessment & Plan Note (Signed)
Doing well on 10mg  every other day of prednisone This is okay--but I asked him to try wean Will check DEXA

## 2019-10-16 NOTE — Progress Notes (Signed)
Hearing Screening   125Hz  250Hz  500Hz  1000Hz  2000Hz  3000Hz  4000Hz  6000Hz  8000Hz   Right ear:           Left ear:           Comments: Has hearing aids. Wearing them today.  Vision Screening Comments: December 2020

## 2019-10-16 NOTE — Assessment & Plan Note (Signed)
I have personally reviewed the Medicare Annual Wellness questionnaire and have noted 1. The patient's medical and social history 2. Their use of alcohol, tobacco or illicit drugs 3. Their current medications and supplements 4. The patient's functional ability including ADL's, fall risks, home safety risks and hearing or visual             impairment. 5. Diet and physical activities 6. Evidence for depression or mood disorders  The patients weight, height, BMI and visual acuity have been recorded in the chart I have made referrals, counseling and provided education to the patient based review of the above and I have provided the pt with a written personalized care plan for preventive services.  I have provided you with a copy of your personalized plan for preventive services. Please take the time to review along with your updated medication list.  Healthy Continue fitness Flu vaccine in the fall He plans shingrix Colon due 2026 Will do last PSA next year

## 2019-10-16 NOTE — Assessment & Plan Note (Signed)
Mild symptoms Would start tamsulosin if more bothersome

## 2019-10-16 NOTE — Assessment & Plan Note (Signed)
See social history 

## 2019-10-18 ENCOUNTER — Ambulatory Visit
Admission: RE | Admit: 2019-10-18 | Discharge: 2019-10-18 | Disposition: A | Discharge status: Home / Self Care | Payer: Medicare Other | Source: Ambulatory Visit | Attending: Internal Medicine | Admitting: Internal Medicine

## 2019-10-18 DIAGNOSIS — Z13820 Encounter for screening for osteoporosis: Secondary | ICD-10-CM | POA: Insufficient documentation

## 2019-10-18 DIAGNOSIS — Z7952 Long term (current) use of systemic steroids: Secondary | ICD-10-CM

## 2019-10-18 DIAGNOSIS — M1990 Unspecified osteoarthritis, unspecified site: Secondary | ICD-10-CM | POA: Insufficient documentation

## 2019-10-18 DIAGNOSIS — M353 Polymyalgia rheumatica: Secondary | ICD-10-CM | POA: Diagnosis not present

## 2019-10-18 DIAGNOSIS — M85852 Other specified disorders of bone density and structure, left thigh: Secondary | ICD-10-CM | POA: Diagnosis not present

## 2019-11-20 DIAGNOSIS — M9905 Segmental and somatic dysfunction of pelvic region: Secondary | ICD-10-CM | POA: Diagnosis not present

## 2019-11-20 DIAGNOSIS — M955 Acquired deformity of pelvis: Secondary | ICD-10-CM | POA: Diagnosis not present

## 2019-11-20 DIAGNOSIS — M9903 Segmental and somatic dysfunction of lumbar region: Secondary | ICD-10-CM | POA: Diagnosis not present

## 2019-11-20 DIAGNOSIS — M5136 Other intervertebral disc degeneration, lumbar region: Secondary | ICD-10-CM | POA: Diagnosis not present

## 2019-12-18 DIAGNOSIS — M9903 Segmental and somatic dysfunction of lumbar region: Secondary | ICD-10-CM | POA: Diagnosis not present

## 2019-12-18 DIAGNOSIS — M9905 Segmental and somatic dysfunction of pelvic region: Secondary | ICD-10-CM | POA: Diagnosis not present

## 2019-12-18 DIAGNOSIS — M955 Acquired deformity of pelvis: Secondary | ICD-10-CM | POA: Diagnosis not present

## 2019-12-18 DIAGNOSIS — M5136 Other intervertebral disc degeneration, lumbar region: Secondary | ICD-10-CM | POA: Diagnosis not present

## 2019-12-24 ENCOUNTER — Telehealth: Payer: Self-pay

## 2019-12-24 NOTE — Telephone Encounter (Signed)
[facility] Night - Client TELEPHONE ADVICE RECORD [facility] Patient Name: Rodney Gray LL Gender: Male DOB: 1950-12-17 Age: 69 Y 44 M 4 D Return Phone Number: [phone] (Primary) Address: City/State/Zip: [place]  [address] Client [facility] Night - Client Client Site [place] Physician Viviana Simpler- MD Contact Type Call Who Is Calling Patient / Member / Family / Caregiver Call Type Triage / Clinical Relationship To Patient Self Return Phone Number [phone] (Primary) Chief Complaint Rectal Symptoms Reason for Call Request to Schedule Office Appointment Initial Comment Caller states that he would like an appointment with Dr. Silvio Pate. Caller states that he has a bulge of tissue protruding from his rectum and he is very concerned. He states that he noticed this on Thursday. Translation No Nurse Assessment Nurse: Micki Riley, RN, Domenick Gong Date/Time (Eastern Time): 12/24/2019 8:18:18 AM Confirm and document reason for call. If symptomatic, describe symptoms. ---Caller states he has had l tissue protruding from his rectum since Thursday. Denies any rectal bleeding. Has the patient had close contact with a person known or suspected to have the novel coronavirus illness OR traveled / lives in area with major community spread (including international travel) in the last 14 days from the onset of symptoms? * If Asymptomatic, screen for exposure and travel within the last 14 days. ---No Does the patient have any new or worsening symptoms? ---Yes Will a triage be completed? ---Yes Related visit to physician within the last 2 weeks? ---N/A Does the PT have any chronic conditions? (i.e. diabetes, asthma, this includes High risk factors for pregnancy, etc.) ---Unknown Is this a behavioral health or substance abuse call? ---No Guidelines Guideline Title Affirmed Question Affirmed Notes  Nurse Date/Time Eilene Ghazi Time) Rectal Symptoms Mild rectal pain Cazares, RN, Domenick Gong 12/24/2019 8:21:42 AM Disp. Time Eilene Ghazi Time) Disposition Final User 12/24/2019 8:24:57 AM See PCP within 24 Hours Yes Cazares, RN, Domenick Gong Disposition Overriden: Home Care PLEASE NOTE: All timestamps contained within this report are represented as [place] Standard Time. CONFIDENTIALTY NOTICE: This fax transmission is intended only for the addressee. It contains information that is legally privileged, confidential or otherwise protected from use or disclosure. If you are not the intended recipient, you are strictly prohibited from reviewing, disclosing, copying using or disseminating any of this information or taking any action in reliance on or regarding this information. If you have received this fax in error, please notify us immediately by telephone so that we can arrange for its return to [place]. Phone: [phone], Toll-Free: [phone], Fax: [phone] Page: 2 of 2 Call Id: [id number] Override Reason: Patient's symptoms need a higher level of care Caller Disagree/Comply Comply Caller Understands Yes PreDisposition Call Doctor Care Advice Given Per Guideline SEE PCP WITHIN 24 HOURS: CALL BACK IF: * Severe pain * Fever occurs * You become worse CARE ADVICE given per Rectal Symptoms (Adult) guideline. Comments User: Magnus Sinning, RN Date/Time Eilene Ghazi Time): 12/24/2019 8:27:56 AM Spoke with Raquel Sarna at office [phone], transferred patient for an appointment. Referrals REFERRED TO PCP OFFICE

## 2019-12-24 NOTE — Telephone Encounter (Signed)
Per appt notes pt already has in office appt on 12/25/19 at 9AM with Dr Glori Bickers

## 2019-12-24 NOTE — Telephone Encounter (Signed)
I will see him then

## 2019-12-25 ENCOUNTER — Ambulatory Visit (INDEPENDENT_AMBULATORY_CARE_PROVIDER_SITE_OTHER): Payer: Medicare Other | Admitting: Family Medicine

## 2019-12-25 ENCOUNTER — Other Ambulatory Visit: Payer: Self-pay

## 2019-12-25 ENCOUNTER — Encounter: Payer: Self-pay | Admitting: Family Medicine

## 2019-12-25 VITALS — BP 112/78 | HR 73 | Temp 97.6°F | Ht 67.75 in | Wt 183.1 lb

## 2019-12-25 DIAGNOSIS — K648 Other hemorrhoids: Secondary | ICD-10-CM | POA: Insufficient documentation

## 2019-12-25 DIAGNOSIS — Z23 Encounter for immunization: Secondary | ICD-10-CM | POA: Diagnosis not present

## 2019-12-25 MED ORDER — HYDROCORTISONE (PERIANAL) 2.5 % EX CREA: 1.0000 "application " | TOPICAL_CREAM | Freq: Two times a day (BID) | CUTANEOUS | 0 refills | Status: DC

## 2019-12-25 NOTE — Patient Instructions (Signed)
I think you have a hemorrhoid Try not to strain - keep stools soft  Wipe gently  tuks pads over the counter may help   Use the anusol hc cream twice daily - outside and a little inside   Use cool compresses also if they help   Update if not starting to improve in 10 days  or if worsening

## 2019-12-25 NOTE — Progress Notes (Signed)
Subjective:    Patient ID: Rodney Gray, male    DOB: [birth year], 69 y.o.   MRN: [id number]  This visit occurred during the SARS-CoV-2 public health emergency.  Safety protocols were in place, including screening questions prior to the visit, additional usage of staff PPE, and extensive cleaning of exam room while observing appropriate contact time as indicated for disinfecting solutions.    HPI 69 yo pt of [name] presents for c/o rectal pain and swelling   Wt Readings from Last 3 Encounters:  12/25/19 183 lb 1 oz (83 kg)  10/16/19 183 lb (83 kg)  05/17/19 189 lb 6.4 oz (85.9 kg)   28.04 kg/m  Last week - wiped with bm  Has a tendency to wipe too hard  He occ gets a streak of blood with very large bms  Noted some tissue sticking out   It was sore on Saturday  Wife put a little rubbing alcohol on it  She lanced it with a sewing needle -not much came out /drop of blood  He tried some heat  Then used ice the past few days -? Calmed down   Today - just a little uncomfortable  Size has not changed   Had anal swelling in the past-saw gi for that (got better by then)   Used otc hemorrhoid cream   He did take one day of clindamycin    Last colonoscopy 2/16- with some divertic  Wants flu shot today   Takes prednisone for PMR   Lab Results  Component Value Date   WBC 6.7 10/16/2019   HGB 14.2 10/16/2019   HCT 42.0 10/16/2019   MCV 95.9 10/16/2019   PLT 245.0 10/16/2019   He takes asa 81 mg daily   Patient Active Problem List   Diagnosis Date Noted  . Hemorrhoid prolapse 12/25/2019  . Polymyalgia rheumatica ([place]) 08/01/2017  . Advance directive discussed with patient 09/12/2015  . BPH with obstruction/lower urinary tract symptoms   . Allergic rhinitis due to pollen   . Routine general medical examination at a health care facility 08/19/2010  . Atrial fibrillation ([place]) 05/12/2009  . OSTEOARTHRITIS 09/13/2006   Past Medical History:  Diagnosis Date    . Allergic rhinitis due to pollen   . Atrial fibrillation ([place])    A-Fib, 1987, 2003, 2005, EPS RX 2005 (A-Fib reocurred)  . Basal cell carcinoma    skin CA- squamous and Basal cell  . BPH (benign prostatic hypertrophy)   . Cataract   . Hyperlipemia    no per pt  . Hypertension    no per pt  . Osteoarthritis   . Polymyalgia rheumatica ([place])   . Squamous cell carcinoma in situ    Past Surgical History:  Procedure Laterality Date  . CAPSULOTOMY Bilateral 08/2007  . CATARACT EXTRACTION Bilateral    both eyes  . COLONOSCOPY    . MENISCUS REPAIR Left 09/2009   left knee---Dr Blackmon  . NASAL SINUS SURGERY  May 2014  . RETINAL TEAR REPAIR CRYOTHERAPY Right    right 12/06  . RETINAL TEAR REPAIR CRYOTHERAPY Right    Retinal tear/bleed- Right 12/06  . SQUAMOUS CELL CARCINOMA EXCISION Left 07/2008   left forearm  . THUMB ARTHROSCOPY Left 1999   Social History   Tobacco Use  . Smoking status: Never Smoker  . Smokeless tobacco: Never Used  Vaping Use  . Vaping Use: Never used  Substance Use Topics  . Alcohol use: Yes    Comment:  occ. 3 per month  . Drug use: Never   Family History  Problem Relation Age of Onset  . Hypertension Father   . Cirrhosis Father 27  . Alcohol abuse Father   . Gallstones Mother   . Hyperlipidemia Mother   . Esophageal cancer Brother   . Asthma Daughter   . Coronary artery disease Neg Hx   . Diabetes Neg Hx   . Cancer Neg Hx        prostate or colon  . Colon cancer Neg Hx   . Rectal cancer Neg Hx   . Stomach cancer Neg Hx    Allergies  Allergen Reactions  . Ampicillin     REACTION: hives Has tolerated cephalosporins   Current Outpatient Medications on File Prior to Visit  Medication Sig Dispense Refill  . aspirin EC 81 MG tablet Take 1 tablet (81 mg total) by mouth daily. 90 tablet 3  . clindamycin (CLEOCIN) 150 MG capsule Take 4 capsules by mouth as needed. Prior to dental appointments    . diltiazem (CARDIZEM CD) 180 MG 24 hr  capsule TAKE 1 CAPSULE BY MOUTH EVERY DAY. 90 capsule 2  . montelukast (SINGULAIR) 10 MG tablet Take 10 mg by mouth as needed. Allergies    . Multiple Vitamins-Minerals (MENS MULTIVITAMIN PLUS PO) Take 1 capsule by mouth daily.     . Omega-3 Fatty Acids (FISH OIL) 1200 MG CAPS Take 1 capsule by mouth daily.    . predniSONE (DELTASONE) 5 MG tablet TAKE 2 TABLETS (10 MG TOTAL) BY MOUTH EVERY OTHER DAY. WEAN AS DIRECTED 100 tablet 3  . Probiotic Product (DIGESTIVE ADVANTAGE GUMMIES PO) Take 1-2 tablets by mouth daily.     No current facility-administered medications on file prior to visit.    Review of Systems  Constitutional: Negative for activity change, appetite change, fatigue, fever and unexpected weight change.  HENT: Negative for congestion, rhinorrhea, sore throat and trouble swallowing.   Eyes: Negative for pain, redness, itching and visual disturbance.  Respiratory: Negative for cough, chest tightness, shortness of breath and wheezing.   Cardiovascular: Negative for chest pain and palpitations.  Gastrointestinal: Positive for anal bleeding and rectal pain. Negative for abdominal distention, abdominal pain, blood in stool, constipation, diarrhea and nausea.  Endocrine: Negative for cold intolerance, heat intolerance, polydipsia and polyuria.  Genitourinary: Negative for difficulty urinating, dysuria, frequency and urgency.  Musculoskeletal: Negative for arthralgias, joint swelling and myalgias.  Skin: Negative for pallor and rash.  Neurological: Negative for dizziness, tremors, weakness, numbness and headaches.  Hematological: Negative for adenopathy. Does not bruise/bleed easily.  Psychiatric/Behavioral: Negative for decreased concentration and dysphoric mood. The patient is not nervous/anxious.        Objective:   Physical Exam Exam conducted with a chaperone present.  Constitutional:      General: He is not in acute distress.    Appearance: Normal appearance. He is normal  weight. He is not ill-appearing.  Eyes:     Conjunctiva/sclera: Conjunctivae normal.     Pupils: Pupils are equal, round, and reactive to light.  Cardiovascular:     Rate and Rhythm: Normal rate.  Pulmonary:     Effort: Pulmonary effort is normal. No respiratory distress.  Abdominal:     General: Abdomen is flat. Bowel sounds are normal. There is no distension.     Palpations: Abdomen is soft. There is no mass.     Tenderness: There is no abdominal tenderness. There is no guarding or rebound.  Hernia: No hernia is present.  Genitourinary:    Rectum: Guaiac result negative. Normal anal tone.     Comments: 1/2 cm round soft mass with blue tint at 8:00 position on anus , able to palpate smaller area within rectum at its base (consistent with hemorrhoid)  Mildly tender if at all  Not thrombosed  No redness or drainage  Stool card is heme neg Skin:    General: Skin is warm.     Findings: No erythema or rash.  Neurological:     Mental Status: He is alert.  Psychiatric:        Mood and Affect: Mood normal.           Assessment & Plan:   Problem List Items Addressed This Visit      Cardiovascular and Mediastinum   Hemorrhoid prolapse    Exam consistent with prolapsed non thrombosed hemorrhoid or external hemorrhoid  Has failed tx with otc med (? Prep H) Adv not to strain  Px anusul hc cream to use ext (and also in anal opening) bid  inst to update if not improved in 10 d or if worse at any time  Handout given re: hemorrhoids  Cool compress ok  Tuks pads if helpful  If not imp would ref to GI

## 2019-12-25 NOTE — Assessment & Plan Note (Signed)
Exam consistent with prolapsed non thrombosed hemorrhoid or external hemorrhoid  Has failed tx with otc med (? Prep H) Adv not to strain  Px anusul hc cream to use ext (and also in anal opening) bid  inst to update if not improved in 10 d or if worse at any time  Handout given re: hemorrhoids  Cool compress ok  Tuks pads if helpful  If not imp would ref to GI

## 2020-01-07 ENCOUNTER — Encounter: Payer: Self-pay | Admitting: Family Medicine

## 2020-01-16 DIAGNOSIS — M9905 Segmental and somatic dysfunction of pelvic region: Secondary | ICD-10-CM | POA: Diagnosis not present

## 2020-01-16 DIAGNOSIS — M5136 Other intervertebral disc degeneration, lumbar region: Secondary | ICD-10-CM | POA: Diagnosis not present

## 2020-01-16 DIAGNOSIS — M955 Acquired deformity of pelvis: Secondary | ICD-10-CM | POA: Diagnosis not present

## 2020-01-16 DIAGNOSIS — M9903 Segmental and somatic dysfunction of lumbar region: Secondary | ICD-10-CM | POA: Diagnosis not present

## 2020-01-23 ENCOUNTER — Encounter: Payer: Self-pay | Admitting: Physician Assistant

## 2020-01-23 ENCOUNTER — Ambulatory Visit (INDEPENDENT_AMBULATORY_CARE_PROVIDER_SITE_OTHER): Payer: Medicare Other | Admitting: Physician Assistant

## 2020-01-23 VITALS — BP 124/68 | HR 89 | Ht 67.75 in | Wt 186.0 lb

## 2020-01-23 DIAGNOSIS — K645 Perianal venous thrombosis: Secondary | ICD-10-CM

## 2020-01-23 NOTE — Patient Instructions (Signed)
How to Take a [employer] A sitz bath is a warm water bath that may be used to care for your rectum, genital area, or the area between your rectum and genitals (perineum). For a sitz bath, the water only comes up to your hips and covers your buttocks. A sitz bath may done at home in a bathtub or with a portable sitz bath that fits over the toilet. Your health care provider may recommend a sitz bath to help:  Relieve pain and discomfort after delivering a baby.  Relieve pain and itching from hemorrhoids or anal fissures.  Relieve pain after certain surgeries.  Relax muscles that are sore or tight. How to take a sitz bath Take 3-4 sitz baths a day, or as many as told by your health care provider. Bathtub sitz bath To take a sitz bath in a bathtub: 1. Partially fill a bathtub with warm water. The water should be deep enough to cover your hips and buttocks when you are sitting in the tub. 2. If your health care provider told you to put medicine in the water, follow his or her instructions. 3. Sit in the water. 4. Open the tub drain a little, and leave it open during your bath. 5. Turn on the warm water again, enough to replace the water that is draining out. Keep the water running throughout your bath. This helps keep the water at the right level and the right temperature. 6. Soak in the water for 15-20 minutes, or as long as told by your health care provider. 7. When you are done, be careful when you stand up. You may feel dizzy. 8. After the sitz bath, pat yourself dry. Do not rub your skin to dry it.  Over-the-toilet sitz bath To take a sitz bath with an over-the-toilet basin: 1. Follow the manufacturer's instructions. 2. Fill the basin with warm water. 3. If your health care provider told you to put medicine in the water, follow his or her instructions. 4. Sit on the seat. Make sure the water covers your buttocks and perineum. 5. Soak in the water for 15-20 minutes, or as long as told by  your health care provider. 6. After the sitz bath, pat yourself dry. Do not rub your skin to dry it. 7. Clean and dry the basin between uses. 8. Discard the basin if it cracks, or according to the manufacturer's instructions. Contact a health care provider if:  Your symptoms get worse. Do not continue with sitz baths if your symptoms get worse.  You have new symptoms. If this happens, do not continue with sitz baths until you talk with your health care provider. Summary  A sitz bath is a warm water bath in which the water only comes up to your hips and covers your buttocks.  A sitz bath may help relieve itching, relieve pain, and relax muscles that are sore or tight in the lower part of your body, including your genital area.  Take 3-4 sitz baths a day, or as many as told by your health care provider. Soak in the water for 15-20 minutes.  Do not continue with sitz baths if your symptoms get worse. This information is not intended to replace advice given to you by your health care provider. Make sure you discuss any questions you have with your health care provider. Document Revised: 08/21/2018 Document Reviewed: 03/24/2017 Elsevier Patient Education  [place].   Use Hydrocortisone cream twice daily to Preparation H suppositories for 1  week  Let us know how you are doing in 2 weeks  [phone]  Follow up as needed  Thank you for choosing [facility]  Lovett Calender

## 2020-01-23 NOTE — Progress Notes (Signed)
Chief Complaint: Rectal knot  HPI:    Rodney Gray is a 69 year old Caucasian male with a past medical history as listed below, known to Dr. Loletha Carrow, who presents to clinic today for complaint AV "rectal knot".    04/30/2019 patient seen in clinic for concern about a "perianal lesion", apparently noticed a "knot" on the anal canal toward the posterior aspect when he was cleaning in the shower last for 10 to 14 days and then went away.  Last colonoscopy noted 05/2014 which was normal except for mild left-sided diverticulosis.  At that time nothing was seen on exam.    Today, patient describes that about a month ago now he noticed a knot come back up on the outside of his rectum and it feels like a bulge, initially some discomfort, but not any longer.  He saw his PCP who prescribed him Hydrocortisone cream and he applied this to the outside and minimally to the inside for about 3 weeks with no change in this not at all and now the patient feels another one to the side of the original one.  This is somewhat uncomfortable with palpation, but he is seeing no blood.  Tells me he is having slightly more frequent bowel movements during the day but they are still soft solid, probably 2-3 times a day and thinks this may be related to the Melatonin gummy he started at night.    Denies fever, chills, blood in his stool, weight loss or symptoms that awaken him from sleep.  Past Medical History:  Diagnosis Date  . Allergic rhinitis due to pollen   . Atrial fibrillation ([place])    A-Fib, 1987, 2003, 2005, EPS RX 2005 (A-Fib reocurred)  . Basal cell carcinoma    skin CA- squamous and Basal cell  . BPH (benign prostatic hypertrophy)   . Cataract   . Hyperlipemia    no per pt  . Hypertension    no per pt  . Osteoarthritis   . Polymyalgia rheumatica ([place])   . Squamous cell carcinoma in situ     Past Surgical History:  Procedure Laterality Date  . CAPSULOTOMY Bilateral 08/2007  . CATARACT EXTRACTION  Bilateral    both eyes  . COLONOSCOPY    . MENISCUS REPAIR Left 09/2009   left knee---Dr Blackmon  . NASAL SINUS SURGERY  May 2014  . RETINAL TEAR REPAIR CRYOTHERAPY Right    right 12/06  . RETINAL TEAR REPAIR CRYOTHERAPY Right    Retinal tear/bleed- Right 12/06  . SQUAMOUS CELL CARCINOMA EXCISION Left 07/2008   left forearm  . THUMB ARTHROSCOPY Left 1999    Current Outpatient Medications  Medication Sig Dispense Refill  . aspirin EC 81 MG tablet Take 1 tablet (81 mg total) by mouth daily. 90 tablet 3  . clindamycin (CLEOCIN) 150 MG capsule Take 4 capsules by mouth as needed. Prior to dental appointments    . diltiazem (CARDIZEM CD) 180 MG 24 hr capsule TAKE 1 CAPSULE BY MOUTH EVERY DAY. 90 capsule 2  . hydrocortisone (ANUSOL-HC) 2.5 % rectal cream Place 1 application rectally 2 (two) times daily. 30 g 0  . Melatonin 1 MG CHEW Chew by mouth.    . montelukast (SINGULAIR) 10 MG tablet Take 10 mg by mouth as needed. Allergies    . Multiple Vitamins-Minerals (MENS MULTIVITAMIN PLUS PO) Take 1 capsule by mouth daily.     . Omega-3 Fatty Acids (FISH OIL) 1200 MG CAPS Take 1 capsule by mouth daily.    Marland Kitchen  predniSONE (DELTASONE) 5 MG tablet TAKE 2 TABLETS (10 MG TOTAL) BY MOUTH EVERY OTHER DAY. WEAN AS DIRECTED 100 tablet 3  . Probiotic Product (DIGESTIVE ADVANTAGE GUMMIES PO) Take 1-2 tablets by mouth daily.     No current facility-administered medications for this visit.    Allergies as of 01/23/2020 - Review Complete 01/23/2020  Allergen Reaction Noted  . Ampicillin  07/29/2006    Family History  Problem Relation Age of Onset  . Hypertension Father   . Cirrhosis Father 8  . Alcohol abuse Father   . Gallstones Mother   . Hyperlipidemia Mother   . Esophageal cancer Brother   . Asthma Daughter   . Coronary artery disease Neg Hx   . Diabetes Neg Hx   . Cancer Neg Hx        prostate or colon  . Colon cancer Neg Hx   . Rectal cancer Neg Hx   . Stomach cancer Neg Hx      Social History   Socioeconomic History  . Marital status: Married    Spouse name: Not on file  . Number of children: 3  . Years of education: Not on file  . Highest education level: Not on file  Occupational History  . Occupation: retired Primary school teacher: RETIRED  Tobacco Use  . Smoking status: Never Smoker  . Smokeless tobacco: Never Used  Vaping Use  . Vaping Use: Never used  Substance and Sexual Activity  . Alcohol use: Yes    Comment: occ. 3 per month  . Drug use: Never  . Sexual activity: Not on file  Other Topics Concern  . Not on file  Social History Narrative   Has living will   Wife is health care POA--alternate is son [name]   Would accept resuscitation attempts   Would accept tube feeds for some time--depending on prognosis   Social Determinants of Health   Financial Resource Strain:   . Difficulty of Paying Living Expenses: Not on file  Food Insecurity:   . Worried About Charity fundraiser in the Last Year: Not on file  . Ran Out of Food in the Last Year: Not on file  Transportation Needs:   . Lack of Transportation (Medical): Not on file  . Lack of Transportation (Non-Medical): Not on file  Physical Activity:   . Days of Exercise per Week: Not on file  . Minutes of Exercise per Session: Not on file  Stress:   . Feeling of Stress : Not on file  Social Connections:   . Frequency of Communication with Friends and Family: Not on file  . Frequency of Social Gatherings with Friends and Family: Not on file  . Attends Religious Services: Not on file  . Active Member of Clubs or Organizations: Not on file  . Attends Archivist Meetings: Not on file  . Marital Status: Not on file  Intimate Partner Violence:   . Fear of Current or Ex-Partner: Not on file  . Emotionally Abused: Not on file  . Physically Abused: Not on file  . Sexually Abused: Not on file    Review of Systems:    Constitutional: No weight loss, fever or  chills Cardiovascular: No chest pain  Respiratory: No SOB Gastrointestinal: See HPI and otherwise negative   Physical Exam:  Vital signs: BP 124/68   Pulse 89   Ht 5' 7.75" (1.721 m)   Wt 186 lb (84.4 kg)   BMI 28.49  kg/m   Constitutional:   Pleasant Caucasian male appears to be in NAD, Well developed, Well nourished, alert and cooperative Respiratory: Respirations even and unlabored. Lungs clear to auscultation bilaterally.   No wheezes, crackles, or rhonchi.  Cardiovascular: Normal S1, S2. No MRG. Regular rate and rhythm. No peripheral edema, cyanosis or pallor.  Gastrointestinal:  Soft, nondistended, nontender. No rebound or guarding. Normal bowel sounds. No appreciable masses or hepatomegaly. Rectal:  External: 2 thrombosed internal hemorrhoids, less than 0.5 cm, minimally tender to palpation; Internal: no mass or discharge Psychiatric:  Demonstrates good judgement and reason without abnormal affect or behaviors.  RELEVANT LABS AND IMAGING: CBC    Component Value Date/Time   WBC 6.7 10/16/2019 0930   RBC 4.38 10/16/2019 0930   HGB 14.2 10/16/2019 0930   HCT 42.0 10/16/2019 0930   PLT 245.0 10/16/2019 0930   MCV 95.9 10/16/2019 0930   MCHC 33.7 10/16/2019 0930   RDW 13.4 10/16/2019 0930   LYMPHSABS 1.2 09/13/2016 1236   MONOABS 0.5 09/13/2016 1236   EOSABS 0.1 09/13/2016 1236   BASOSABS 0.0 09/13/2016 1236    CMP     Component Value Date/Time   NA 138 10/16/2019 0930   K 4.4 10/16/2019 0930   CL 103 10/16/2019 0930   CO2 30 10/16/2019 0930   GLUCOSE 90 10/16/2019 0930   BUN 14 10/16/2019 0930   CREATININE 0.86 10/16/2019 0930   CALCIUM 9.3 10/16/2019 0930   PROT 6.4 10/16/2019 0930   ALBUMIN 4.4 10/16/2019 0930   AST 16 10/16/2019 0930   ALT 13 10/16/2019 0930   ALKPHOS 43 10/16/2019 0930   BILITOT 1.0 10/16/2019 0930   GFRNONAA 105.24 05/07/2009 0924   GFRAA 99 09/22/2007 0920    Assessment: 1.  Thrombosed internal hemorrhoids: Been there for a month  now, minimally tender to palpation  Plan: 1.  Discussed with patient he should discontinue his Melatonin gummy at night to see if this helps with his more frequent bowel movements. 2.  Patient still has Hydrocortisone ointment at home.  Recommend he apply this to a Preparation H suppository twice daily x1 week. 3.  Discussed sitz bath's for 15 to 20 minutes 2-3 times a day. 4.  If patient's hemorrhoids do not resolve over the next 2 weeks, he is going to let me know, at that point may recommend a surgical referral for I&D. 5.  Patient to follow in clinic with [place] as needed, pending resolution of above.  Rodney Newer, PA-C [facility] 01/23/2020, 9:45 AM  Cc: Venia Carbon, MD

## 2020-01-24 NOTE — Progress Notes (Signed)
____________________________________________________________  Attending physician addendum:  Thank you for sending this case to me. I have reviewed the entire note, and the outlined plan seems appropriate.  They will most likely respond to conservative measures such as sitz bath's and witch hazel pads.  Surgical referral if necessary. Maintain bowel regularity and avoid straining.  Wilfrid Lund, MD  ____________________________________________________________

## 2020-01-28 DIAGNOSIS — Z23 Encounter for immunization: Secondary | ICD-10-CM | POA: Diagnosis not present

## 2020-02-05 DIAGNOSIS — L570 Actinic keratosis: Secondary | ICD-10-CM | POA: Diagnosis not present

## 2020-02-06 ENCOUNTER — Telehealth: Payer: Self-pay | Admitting: Physician Assistant

## 2020-02-06 NOTE — Telephone Encounter (Signed)
Rodney Gray please see the note from the pt.  He is doing much better.

## 2020-02-13 DIAGNOSIS — M955 Acquired deformity of pelvis: Secondary | ICD-10-CM | POA: Diagnosis not present

## 2020-02-13 DIAGNOSIS — M9903 Segmental and somatic dysfunction of lumbar region: Secondary | ICD-10-CM | POA: Diagnosis not present

## 2020-02-13 DIAGNOSIS — M5136 Other intervertebral disc degeneration, lumbar region: Secondary | ICD-10-CM | POA: Diagnosis not present

## 2020-02-13 DIAGNOSIS — M9905 Segmental and somatic dysfunction of pelvic region: Secondary | ICD-10-CM | POA: Diagnosis not present

## 2020-03-19 DIAGNOSIS — M9903 Segmental and somatic dysfunction of lumbar region: Secondary | ICD-10-CM | POA: Diagnosis not present

## 2020-03-19 DIAGNOSIS — M9905 Segmental and somatic dysfunction of pelvic region: Secondary | ICD-10-CM | POA: Diagnosis not present

## 2020-03-19 DIAGNOSIS — M5136 Other intervertebral disc degeneration, lumbar region: Secondary | ICD-10-CM | POA: Diagnosis not present

## 2020-03-19 DIAGNOSIS — M955 Acquired deformity of pelvis: Secondary | ICD-10-CM | POA: Diagnosis not present

## 2020-04-09 DIAGNOSIS — Z859 Personal history of malignant neoplasm, unspecified: Secondary | ICD-10-CM | POA: Diagnosis not present

## 2020-04-09 DIAGNOSIS — D485 Neoplasm of uncertain behavior of skin: Secondary | ICD-10-CM | POA: Diagnosis not present

## 2020-04-09 DIAGNOSIS — L578 Other skin changes due to chronic exposure to nonionizing radiation: Secondary | ICD-10-CM | POA: Diagnosis not present

## 2020-04-09 DIAGNOSIS — L570 Actinic keratosis: Secondary | ICD-10-CM | POA: Diagnosis not present

## 2020-04-09 DIAGNOSIS — Z85828 Personal history of other malignant neoplasm of skin: Secondary | ICD-10-CM | POA: Diagnosis not present

## 2020-04-09 DIAGNOSIS — D044 Carcinoma in situ of skin of scalp and neck: Secondary | ICD-10-CM | POA: Diagnosis not present

## 2020-04-09 DIAGNOSIS — Z872 Personal history of diseases of the skin and subcutaneous tissue: Secondary | ICD-10-CM | POA: Diagnosis not present

## 2020-04-16 DIAGNOSIS — M9903 Segmental and somatic dysfunction of lumbar region: Secondary | ICD-10-CM | POA: Diagnosis not present

## 2020-04-16 DIAGNOSIS — M5136 Other intervertebral disc degeneration, lumbar region: Secondary | ICD-10-CM | POA: Diagnosis not present

## 2020-04-16 DIAGNOSIS — M955 Acquired deformity of pelvis: Secondary | ICD-10-CM | POA: Diagnosis not present

## 2020-04-16 DIAGNOSIS — M9905 Segmental and somatic dysfunction of pelvic region: Secondary | ICD-10-CM | POA: Diagnosis not present

## 2020-04-29 DIAGNOSIS — D234 Other benign neoplasm of skin of scalp and neck: Secondary | ICD-10-CM | POA: Diagnosis not present

## 2020-04-29 DIAGNOSIS — C4442 Squamous cell carcinoma of skin of scalp and neck: Secondary | ICD-10-CM | POA: Diagnosis not present

## 2020-05-14 DIAGNOSIS — L570 Actinic keratosis: Secondary | ICD-10-CM | POA: Diagnosis not present

## 2020-05-14 DIAGNOSIS — M955 Acquired deformity of pelvis: Secondary | ICD-10-CM | POA: Diagnosis not present

## 2020-05-14 DIAGNOSIS — M5136 Other intervertebral disc degeneration, lumbar region: Secondary | ICD-10-CM | POA: Diagnosis not present

## 2020-05-14 DIAGNOSIS — M9905 Segmental and somatic dysfunction of pelvic region: Secondary | ICD-10-CM | POA: Diagnosis not present

## 2020-05-14 DIAGNOSIS — M9903 Segmental and somatic dysfunction of lumbar region: Secondary | ICD-10-CM | POA: Diagnosis not present

## 2020-05-23 ENCOUNTER — Other Ambulatory Visit: Payer: Self-pay

## 2020-05-23 ENCOUNTER — Ambulatory Visit (INDEPENDENT_AMBULATORY_CARE_PROVIDER_SITE_OTHER): Payer: Medicare Other | Admitting: Internal Medicine

## 2020-05-23 ENCOUNTER — Encounter: Payer: Self-pay | Admitting: Internal Medicine

## 2020-05-23 VITALS — BP 130/70 | HR 74 | Ht 67.75 in | Wt 185.0 lb

## 2020-05-23 DIAGNOSIS — I4821 Permanent atrial fibrillation: Secondary | ICD-10-CM | POA: Diagnosis not present

## 2020-05-23 NOTE — Patient Instructions (Addendum)
Medication Instructions:  Your physician recommends that you continue on your current medications as directed. Please refer to the Current Medication list given to you today.  Labwork: None ordered.  Testing/Procedures: None ordered.  Follow-Up:  You will check your blood pressure 5 times over the next few weeks and record those numbers.  Please call the office or send a mychart message to relay those numbers to Dr. Lovena Le.  Your physician wants you to follow-up in: one year with Cristopher Peru, MD or one of the following Advanced Practice Providers on your designated Care Team:    Chanetta Marshall, NP  Tommye Standard, PA-C  Legrand Como "Jonni Sanger" Chalmers Cater, [place]  Any Other Special Instructions Will Be Listed Below (If Applicable).  If you need a refill on your cardiac medications before your next appointment, please call your pharmacy.

## 2020-05-23 NOTE — Progress Notes (Signed)
HPI [name] returns today for ongoing evaluation and management of chronic atrial fib. He is a pleasant 70 yo man who has had rate controlled atrial fib for over 12 years. He is asymptomatic. He does not have palpitations. He has been active and denies chest pain or sob with exertion. He was diagnosed with PMR over a year ago and has been on low dose prednisone. His bp is well controlled at home.   Allergies  Allergen Reactions  . Ampicillin     REACTION: hives Has tolerated cephalosporins     Current Outpatient Medications  Medication Sig Dispense Refill  . aspirin EC 81 MG tablet Take 1 tablet (81 mg total) by mouth daily. 90 tablet 3  . clindamycin (CLEOCIN) 150 MG capsule Take 4 capsules by mouth as needed. Prior to dental appointments    . diltiazem (CARDIZEM CD) 180 MG 24 hr capsule TAKE 1 CAPSULE BY MOUTH EVERY DAY. 90 capsule 2  . hydrocortisone (ANUSOL-HC) 2.5 % rectal cream Place 1 application rectally 2 (two) times daily. 30 g 0  . Melatonin 1 MG CHEW Chew by mouth.    . montelukast (SINGULAIR) 10 MG tablet Take 10 mg by mouth as needed. Allergies    . Multiple Vitamins-Minerals (MENS MULTIVITAMIN PLUS PO) Take 1 capsule by mouth daily.    . Omega-3 Fatty Acids (FISH OIL) 1200 MG CAPS Take 1 capsule by mouth daily.    . predniSONE (DELTASONE) 5 MG tablet TAKE 2 TABLETS (10 MG TOTAL) BY MOUTH EVERY OTHER DAY. WEAN AS DIRECTED 100 tablet 3  . Probiotic Product (DIGESTIVE ADVANTAGE GUMMIES PO) Take 1-2 tablets by mouth daily.     No current facility-administered medications for this visit.     Past Medical History:  Diagnosis Date  . Allergic rhinitis due to pollen   . Atrial fibrillation ([place])    A-Fib, 1987, 2003, 2005, EPS RX 2005 (A-Fib reocurred)  . Basal cell carcinoma    skin CA- squamous and Basal cell  . BPH (benign prostatic hypertrophy)   . Cataract   . Hyperlipemia    no per pt  . Hypertension    no per pt  . Osteoarthritis   .  Polymyalgia rheumatica ([place])   . Squamous cell carcinoma in situ     ROS:   All systems reviewed and negative except as noted in the HPI.   Past Surgical History:  Procedure Laterality Date  . CAPSULOTOMY Bilateral 08/2007  . CATARACT EXTRACTION Bilateral    both eyes  . COLONOSCOPY    . MENISCUS REPAIR Left 09/2009   left knee---Dr Blackmon  . NASAL SINUS SURGERY  May 2014  . RETINAL TEAR REPAIR CRYOTHERAPY Right    right 12/06  . RETINAL TEAR REPAIR CRYOTHERAPY Right    Retinal tear/bleed- Right 12/06  . SQUAMOUS CELL CARCINOMA EXCISION Left 07/2008   left forearm  . THUMB ARTHROSCOPY Left 1999     Family History  Problem Relation Age of Onset  . Hypertension Father   . Cirrhosis Father 65  . Alcohol abuse Father   . Gallstones Mother   . Hyperlipidemia Mother   . Esophageal cancer Brother   . Asthma Daughter   . Coronary artery disease Neg Hx   . Diabetes Neg Hx   . Cancer Neg Hx        prostate or colon  . Colon cancer Neg Hx   . Rectal cancer Neg Hx   . Stomach cancer  Neg Hx      Social History   Socioeconomic History  . Marital status: Married    Spouse name: Not on file  . Number of children: 3  . Years of education: Not on file  . Highest education level: Not on file  Occupational History  . Occupation: retired Primary school teacher: RETIRED  Tobacco Use  . Smoking status: Never Smoker  . Smokeless tobacco: Never Used  Vaping Use  . Vaping Use: Never used  Substance and Sexual Activity  . Alcohol use: Yes    Comment: occ. 3 per month  . Drug use: Never  . Sexual activity: Not on file  Other Topics Concern  . Not on file  Social History Narrative   Has living will   Wife is health care POA--alternate is son [name]   Would accept resuscitation attempts   Would accept tube feeds for some time--depending on prognosis   Social Determinants of Health   Financial Resource Strain: Not on file  Food Insecurity: Not on file   Transportation Needs: Not on file  Physical Activity: Not on file  Stress: Not on file  Social Connections: Not on file  Intimate Partner Violence: Not on file     BP 130/70   Pulse 74   Ht 5' 7.75" (1.721 m)   Wt 185 lb (83.9 kg)   SpO2 98%   BMI 28.34 kg/m   Physical Exam:  Well appearing NAD HEENT: Unremarkable Neck:  No JVD, no thyromegally Lymphatics:  No adenopathy Back:  No CVA tenderness Lungs:  Clear with no wheezes HEART:  Regular rate rhythm, no murmurs, no rubs, no clicks Abd:  soft, positive bowel sounds, no organomegally, no rebound, no guarding Ext:  2 plus pulses, no edema, no cyanosis, no clubbing Skin:  No rashes no nodules Neuro:  CN II through XII intact, motor grossly intact  EKG - atrial fib with a controlled VR   Assess/Plan: 1. Chronic atrial fib - his rates are well controlled. His CHADSVASC score remains 1. He does not have a diagnosis of HTN although his bp has been up a bit on steroids. I asked him to check his bp at home and call if his bp is over 130.   Carleene Overlie Rodney Gick,MD

## 2020-06-11 DIAGNOSIS — H5213 Myopia, bilateral: Secondary | ICD-10-CM | POA: Diagnosis not present

## 2020-06-11 DIAGNOSIS — H524 Presbyopia: Secondary | ICD-10-CM | POA: Diagnosis not present

## 2020-06-11 DIAGNOSIS — Z961 Presence of intraocular lens: Secondary | ICD-10-CM | POA: Diagnosis not present

## 2020-06-11 DIAGNOSIS — M955 Acquired deformity of pelvis: Secondary | ICD-10-CM | POA: Diagnosis not present

## 2020-06-11 DIAGNOSIS — H40013 Open angle with borderline findings, low risk, bilateral: Secondary | ICD-10-CM | POA: Diagnosis not present

## 2020-06-11 DIAGNOSIS — H04123 Dry eye syndrome of bilateral lacrimal glands: Secondary | ICD-10-CM | POA: Diagnosis not present

## 2020-06-11 DIAGNOSIS — M5136 Other intervertebral disc degeneration, lumbar region: Secondary | ICD-10-CM | POA: Diagnosis not present

## 2020-06-11 DIAGNOSIS — M9903 Segmental and somatic dysfunction of lumbar region: Secondary | ICD-10-CM | POA: Diagnosis not present

## 2020-06-11 DIAGNOSIS — M9905 Segmental and somatic dysfunction of pelvic region: Secondary | ICD-10-CM | POA: Diagnosis not present

## 2020-06-11 DIAGNOSIS — H52223 Regular astigmatism, bilateral: Secondary | ICD-10-CM | POA: Diagnosis not present

## 2020-06-11 DIAGNOSIS — H33301 Unspecified retinal break, right eye: Secondary | ICD-10-CM | POA: Diagnosis not present

## 2020-07-16 DIAGNOSIS — M9903 Segmental and somatic dysfunction of lumbar region: Secondary | ICD-10-CM | POA: Diagnosis not present

## 2020-07-16 DIAGNOSIS — M955 Acquired deformity of pelvis: Secondary | ICD-10-CM | POA: Diagnosis not present

## 2020-07-16 DIAGNOSIS — M5136 Other intervertebral disc degeneration, lumbar region: Secondary | ICD-10-CM | POA: Diagnosis not present

## 2020-07-16 DIAGNOSIS — M9905 Segmental and somatic dysfunction of pelvic region: Secondary | ICD-10-CM | POA: Diagnosis not present

## 2020-07-31 DIAGNOSIS — H40013 Open angle with borderline findings, low risk, bilateral: Secondary | ICD-10-CM | POA: Diagnosis not present

## 2020-08-13 DIAGNOSIS — M5136 Other intervertebral disc degeneration, lumbar region: Secondary | ICD-10-CM | POA: Diagnosis not present

## 2020-08-13 DIAGNOSIS — M955 Acquired deformity of pelvis: Secondary | ICD-10-CM | POA: Diagnosis not present

## 2020-08-13 DIAGNOSIS — M9903 Segmental and somatic dysfunction of lumbar region: Secondary | ICD-10-CM | POA: Diagnosis not present

## 2020-08-13 DIAGNOSIS — M9905 Segmental and somatic dysfunction of pelvic region: Secondary | ICD-10-CM | POA: Diagnosis not present

## 2020-08-15 DIAGNOSIS — H903 Sensorineural hearing loss, bilateral: Secondary | ICD-10-CM | POA: Diagnosis not present

## 2020-08-15 DIAGNOSIS — H6123 Impacted cerumen, bilateral: Secondary | ICD-10-CM | POA: Diagnosis not present

## 2020-09-09 ENCOUNTER — Telehealth: Payer: Self-pay

## 2020-09-09 DIAGNOSIS — Z20822 Contact with and (suspected) exposure to covid-19: Secondary | ICD-10-CM | POA: Diagnosis not present

## 2020-09-09 DIAGNOSIS — Z03818 Encounter for observation for suspected exposure to other biological agents ruled out: Secondary | ICD-10-CM | POA: Diagnosis not present

## 2020-09-09 NOTE — Telephone Encounter (Signed)
Pt tested positive for covid on home test 08-29-20. He was symptomatic then with cough, and congestion. He is feeling much better. Says he is having allergy like symptoms. He and wife will be traveling after 6-17 and need a letter of recovery from covid. They did another home covid test and it is still reading positive. I gave them info on PCR testing at [facility] because they have to have that, also. He is aware it will be next week before [name] will be in to approve the letter.

## 2020-09-09 NOTE — Telephone Encounter (Signed)
Hank you. I advised the pt and he will get it from [facility].

## 2020-09-10 DIAGNOSIS — M5136 Other intervertebral disc degeneration, lumbar region: Secondary | ICD-10-CM | POA: Diagnosis not present

## 2020-09-10 DIAGNOSIS — M9905 Segmental and somatic dysfunction of pelvic region: Secondary | ICD-10-CM | POA: Diagnosis not present

## 2020-09-10 DIAGNOSIS — M955 Acquired deformity of pelvis: Secondary | ICD-10-CM | POA: Diagnosis not present

## 2020-09-10 DIAGNOSIS — M9903 Segmental and somatic dysfunction of lumbar region: Secondary | ICD-10-CM | POA: Diagnosis not present

## 2020-10-08 DIAGNOSIS — M62830 Muscle spasm of back: Secondary | ICD-10-CM | POA: Diagnosis not present

## 2020-10-08 DIAGNOSIS — M5136 Other intervertebral disc degeneration, lumbar region: Secondary | ICD-10-CM | POA: Diagnosis not present

## 2020-10-08 DIAGNOSIS — M9903 Segmental and somatic dysfunction of lumbar region: Secondary | ICD-10-CM | POA: Diagnosis not present

## 2020-10-08 DIAGNOSIS — M9902 Segmental and somatic dysfunction of thoracic region: Secondary | ICD-10-CM | POA: Diagnosis not present

## 2020-10-17 ENCOUNTER — Encounter: Payer: Self-pay | Admitting: Internal Medicine

## 2020-10-17 ENCOUNTER — Other Ambulatory Visit: Payer: Self-pay

## 2020-10-17 ENCOUNTER — Ambulatory Visit (INDEPENDENT_AMBULATORY_CARE_PROVIDER_SITE_OTHER): Payer: Medicare Other | Admitting: Internal Medicine

## 2020-10-17 VITALS — BP 104/72 | HR 84 | Temp 97.8°F | Ht 67.75 in | Wt 185.0 lb

## 2020-10-17 DIAGNOSIS — M353 Polymyalgia rheumatica: Secondary | ICD-10-CM | POA: Diagnosis not present

## 2020-10-17 DIAGNOSIS — I4819 Other persistent atrial fibrillation: Secondary | ICD-10-CM | POA: Diagnosis not present

## 2020-10-17 DIAGNOSIS — Z7189 Other specified counseling: Secondary | ICD-10-CM

## 2020-10-17 DIAGNOSIS — N138 Other obstructive and reflux uropathy: Secondary | ICD-10-CM | POA: Diagnosis not present

## 2020-10-17 DIAGNOSIS — N401 Enlarged prostate with lower urinary tract symptoms: Secondary | ICD-10-CM

## 2020-10-17 DIAGNOSIS — Z0000 Encounter for general adult medical examination without abnormal findings: Secondary | ICD-10-CM | POA: Diagnosis not present

## 2020-10-17 LAB — CBC
HCT: 44.1 % (ref 39.0–52.0)
Hemoglobin: 15 g/dL (ref 13.0–17.0)
MCHC: 34 g/dL (ref 30.0–36.0)
MCV: 95.1 fl (ref 78.0–100.0)
Platelets: 255 10*3/uL (ref 150.0–400.0)
RBC: 4.63 Mil/uL (ref 4.22–5.81)
RDW: 13.6 % (ref 11.5–15.5)
WBC: 8.9 10*3/uL (ref 4.0–10.5)

## 2020-10-17 LAB — COMPREHENSIVE METABOLIC PANEL
ALT: 14 U/L (ref 0–53)
AST: 20 U/L (ref 0–37)
Albumin: 4.4 g/dL (ref 3.5–5.2)
Alkaline Phosphatase: 43 U/L (ref 39–117)
BUN: 16 mg/dL (ref 6–23)
CO2: 29 mEq/L (ref 19–32)
Calcium: 9.6 mg/dL (ref 8.4–10.5)
Chloride: 103 mEq/L (ref 96–112)
Creatinine, Ser: 0.9 mg/dL (ref 0.40–1.50)
GFR: 86.69 mL/min (ref >60.00)
Glucose, Bld: 91 mg/dL (ref 70–99)
Potassium: 4.2 mEq/L (ref 3.5–5.1)
Sodium: 138 mEq/L (ref 135–145)
Total Bilirubin: 1.1 mg/dL (ref 0.2–1.2)
Total Protein: 7 g/dL (ref 6.0–8.3)

## 2020-10-17 LAB — SEDIMENTATION RATE: Sed Rate: 10 mm/hr (ref 0–20)

## 2020-10-17 MED ORDER — PREDNISONE 1 MG PO TABS: 4.0000 mg | ORAL_TABLET | Freq: Every day | ORAL | 3 refills | Status: DC

## 2020-10-17 NOTE — Patient Instructions (Signed)
Try decreasing prednisone to 9mg  every other day for a month. If you do well after a month, you can cut back to 8mg  and so on.

## 2020-10-17 NOTE — Assessment & Plan Note (Signed)
Seems to be quiet Will try a very slow wean---try 9mg  for a month, etc (if sed rate normal)

## 2020-10-17 NOTE — Assessment & Plan Note (Signed)
Rate is good on diltiazem Still just on ASA

## 2020-10-17 NOTE — Assessment & Plan Note (Signed)
See social history 

## 2020-10-17 NOTE — Assessment & Plan Note (Signed)
I have personally reviewed the Medicare Annual Wellness questionnaire and have noted 1. The patient's medical and social history 2. Their use of alcohol, tobacco or illicit drugs 3. Their current medications and supplements 4. The patient's functional ability including ADL's, fall risks, home safety risks and hearing or visual             impairment. 5. Diet and physical activities 6. Evidence for depression or mood disorders  The patients weight, height, BMI and visual acuity have been recorded in the chart I have made referrals, counseling and provided education to the patient based review of the above and I have provided the pt with a written personalized care plan for preventive services.  I have provided you with a copy of your personalized plan for preventive services. Please take the time to review along with your updated medication list.  Last colon in 2026 Will check one last PSA Second COVID booster later in the year Flu vaccine in the fall Will consider shingrix at pharmacy Stays fit

## 2020-10-17 NOTE — Progress Notes (Signed)
Subjective:    Patient ID: Rodney Gray, male    DOB: Aug 03, 1950, 70 y.o.   MRN: [id number]  HPI Here for Medicare wellness visit and follow up of chronic health conditions This visit occurred during the SARS-CoV-2 public health emergency.  Safety protocols were in place, including screening questions prior to the visit, additional usage of staff PPE, and extensive cleaning of exam room while observing appropriate contact time as indicated for disinfecting solutions.   Reviewed form and advanced directives Reviewed other doctors Occasional alcohol No tobacco Exercises regularly--walks 2 miles daily plus resistance Vision is fine Some hearing loss--hearing aides No falls No depression or anhedonia Independent with instrumental ADLs No sig memory issues  PMR seems okay Still on prednisone 10 mg every other day Sees chiropractor monthly for his back  No recent heart racing No SOB Gets some gas pain in to chest---goes away with passing gas No dizziness or syncope No edema Still just on ASA  Not using the singulair regularly No allergy problems  Voids okay--some increased frequency Nocturia x 1-2 Flow is slow but okay  Current Outpatient Medications on File Prior to Visit  Medication Sig Dispense Refill   aspirin EC 81 MG tablet Take 1 tablet (81 mg total) by mouth daily. 90 tablet 3   clindamycin (CLEOCIN) 150 MG capsule Take 4 capsules by mouth as needed. Prior to dental appointments     diltiazem (CARDIZEM CD) 180 MG 24 hr capsule TAKE 1 CAPSULE BY MOUTH EVERY DAY. 90 capsule 2   hydrocortisone (ANUSOL-HC) 2.5 % rectal cream Place 1 application rectally 2 (two) times daily. 30 g 0   montelukast (SINGULAIR) 10 MG tablet Take 10 mg by mouth as needed. Allergies     Multiple Vitamins-Minerals (MENS MULTIVITAMIN PLUS PO) Take 1 capsule by mouth daily.     Omega-3 Fatty Acids (FISH OIL) 1200 MG CAPS Take 1 capsule by mouth daily.     predniSONE (DELTASONE) 5 MG  tablet TAKE 2 TABLETS (10 MG TOTAL) BY MOUTH EVERY OTHER DAY. WEAN AS DIRECTED 100 tablet 3   Probiotic Product (DIGESTIVE ADVANTAGE GUMMIES PO) Take 1-2 tablets by mouth daily.     No current facility-administered medications on file prior to visit.    Allergies  Allergen Reactions   Ampicillin     REACTION: hives Has tolerated cephalosporins    Past Medical History:  Diagnosis Date   Allergic rhinitis due to pollen    Atrial fibrillation ([place])    A-Fib, 1987, 2003, 2005, EPS RX 2005 (A-Fib reocurred)   Basal cell carcinoma    skin CA- squamous and Basal cell   BPH (benign prostatic hypertrophy)    Cataract    Hyperlipemia    no per pt   Hypertension    no per pt   Osteoarthritis    Polymyalgia rheumatica ([place])    Squamous cell carcinoma in situ     Past Surgical History:  Procedure Laterality Date   CAPSULOTOMY Bilateral 08/2007   CATARACT EXTRACTION Bilateral    both eyes   COLONOSCOPY     MENISCUS REPAIR Left 09/2009   left knee---[facility]   NASAL SINUS SURGERY  May 2014   RETINAL TEAR REPAIR CRYOTHERAPY Right    right 12/06   RETINAL TEAR REPAIR CRYOTHERAPY Right    Retinal tear/bleed- Right 12/06   SQUAMOUS CELL CARCINOMA EXCISION Left 07/2008   left forearm   THUMB ARTHROSCOPY Left 1999    Family History  Problem Relation Age  of Onset   Hypertension Father    Cirrhosis Father 19   Alcohol abuse Father    Gallstones Mother    Hyperlipidemia Mother    Esophageal cancer Brother    Asthma Daughter    Coronary artery disease Neg Hx    Diabetes Neg Hx    Cancer Neg Hx        prostate or colon   Colon cancer Neg Hx    Rectal cancer Neg Hx    Stomach cancer Neg Hx     Social History   Socioeconomic History   Marital status: Married    Spouse name: Not on file   Number of children: 3   Years of education: Not on file   Highest education level: Not on file  Occupational History   Occupation: retired Primary school teacher:  RETIRED  Tobacco Use   Smoking status: Never   Smokeless tobacco: Never  Vaping Use   Vaping Use: Never used  Substance and Sexual Activity   Alcohol use: Yes    Comment: occ. 3 per month   Drug use: Never   Sexual activity: Not on file  Other Topics Concern   Not on file  Social History Narrative   Has living will   Wife is health care POA--alternate is son Rodney Gray   Would accept resuscitation attempts   Would accept tube feeds for some time--depending on prognosis   Social Determinants of Health   Financial Resource Strain: Not on file  Food Insecurity: Not on file  Transportation Needs: Not on file  Physical Activity: Not on file  Stress: Not on file  Social Connections: Not on file  Intimate Partner Violence: Not on file   Review of Systems Concerned about appetite --but hasn't gained weight Sleep is not great--some trouble reinitiating after nocturia at times Bowels are regular--no blood Wears seat belt Some right hip pain Teeth okay--goes regular with dentist No suspicious skin lesions today Occasional heartburn--using tums with success. No dysphagia     Objective:   Physical Exam Constitutional:      Appearance: Normal appearance.  HENT:     Mouth/Throat:     Comments: No lesions Eyes:     Conjunctiva/sclera: Conjunctivae normal.     Pupils: Pupils are equal, round, and reactive to light.  Cardiovascular:     Rate and Rhythm: Normal rate. Rhythm irregular.     Pulses: Normal pulses.     Heart sounds: No murmur heard.   No gallop.  Pulmonary:     Effort: Pulmonary effort is normal.     Breath sounds: Normal breath sounds. No wheezing or rales.  Abdominal:     Palpations: Abdomen is soft.     Tenderness: There is no abdominal tenderness.  Musculoskeletal:     Cervical back: Normal range of motion and neck supple.  Skin:    General: Skin is warm.     Findings: No rash.  Neurological:     Mental Status: He is alert and oriented to person, place, and  time.     Comments: President--- "Edmon Crape, Trump, Obama" [phone] [id number] Recall 3/3  Psychiatric:        Mood and Affect: Mood normal.        Behavior: Behavior normal.           Assessment & Plan:

## 2020-10-17 NOTE — Assessment & Plan Note (Signed)
Doing okay without meds Would consider tamsulosin if worsens

## 2020-10-26 ENCOUNTER — Other Ambulatory Visit: Payer: Self-pay | Admitting: Internal Medicine

## 2020-10-27 DIAGNOSIS — L570 Actinic keratosis: Secondary | ICD-10-CM | POA: Diagnosis not present

## 2020-10-27 DIAGNOSIS — Z85828 Personal history of other malignant neoplasm of skin: Secondary | ICD-10-CM | POA: Diagnosis not present

## 2020-10-27 DIAGNOSIS — L578 Other skin changes due to chronic exposure to nonionizing radiation: Secondary | ICD-10-CM | POA: Diagnosis not present

## 2020-10-27 DIAGNOSIS — Z872 Personal history of diseases of the skin and subcutaneous tissue: Secondary | ICD-10-CM | POA: Diagnosis not present

## 2020-10-27 DIAGNOSIS — Z859 Personal history of malignant neoplasm, unspecified: Secondary | ICD-10-CM | POA: Diagnosis not present

## 2020-11-03 DIAGNOSIS — M9903 Segmental and somatic dysfunction of lumbar region: Secondary | ICD-10-CM | POA: Diagnosis not present

## 2020-11-03 DIAGNOSIS — M9902 Segmental and somatic dysfunction of thoracic region: Secondary | ICD-10-CM | POA: Diagnosis not present

## 2020-11-03 DIAGNOSIS — M5136 Other intervertebral disc degeneration, lumbar region: Secondary | ICD-10-CM | POA: Diagnosis not present

## 2020-11-03 DIAGNOSIS — M62830 Muscle spasm of back: Secondary | ICD-10-CM | POA: Diagnosis not present

## 2020-11-28 ENCOUNTER — Other Ambulatory Visit: Payer: Self-pay

## 2020-11-28 ENCOUNTER — Emergency Department: Payer: Medicare Other

## 2020-11-28 ENCOUNTER — Telehealth: Payer: Self-pay | Admitting: Internal Medicine

## 2020-11-28 ENCOUNTER — Emergency Department
Admission: EM | Admit: 2020-11-28 | Discharge: 2020-11-28 | Disposition: A | Discharge status: Home / Self Care | Payer: Medicare Other | Attending: Emergency Medicine | Admitting: Emergency Medicine

## 2020-11-28 DIAGNOSIS — Z7982 Long term (current) use of aspirin: Secondary | ICD-10-CM | POA: Diagnosis not present

## 2020-11-28 DIAGNOSIS — Z85828 Personal history of other malignant neoplasm of skin: Secondary | ICD-10-CM | POA: Diagnosis not present

## 2020-11-28 DIAGNOSIS — I700 Atherosclerosis of aorta: Secondary | ICD-10-CM | POA: Diagnosis not present

## 2020-11-28 DIAGNOSIS — Z8679 Personal history of other diseases of the circulatory system: Secondary | ICD-10-CM | POA: Insufficient documentation

## 2020-11-28 DIAGNOSIS — R109 Unspecified abdominal pain: Secondary | ICD-10-CM | POA: Insufficient documentation

## 2020-11-28 DIAGNOSIS — I10 Essential (primary) hypertension: Secondary | ICD-10-CM | POA: Insufficient documentation

## 2020-11-28 DIAGNOSIS — N200 Calculus of kidney: Secondary | ICD-10-CM | POA: Diagnosis not present

## 2020-11-28 DIAGNOSIS — K449 Diaphragmatic hernia without obstruction or gangrene: Secondary | ICD-10-CM | POA: Diagnosis not present

## 2020-11-28 DIAGNOSIS — N132 Hydronephrosis with renal and ureteral calculous obstruction: Secondary | ICD-10-CM | POA: Diagnosis not present

## 2020-11-28 DIAGNOSIS — K5730 Diverticulosis of large intestine without perforation or abscess without bleeding: Secondary | ICD-10-CM | POA: Diagnosis not present

## 2020-11-28 DIAGNOSIS — I4891 Unspecified atrial fibrillation: Secondary | ICD-10-CM | POA: Diagnosis not present

## 2020-11-28 LAB — CBC WITH DIFFERENTIAL/PLATELET
Abs Immature Granulocytes: 0.02 10*3/uL (ref 0.00–0.07)
Basophils Absolute: 0.1 10*3/uL (ref 0.0–0.1)
Basophils Relative: 1 %
Eosinophils Absolute: 0.2 10*3/uL (ref 0.0–0.5)
Eosinophils Relative: 2 %
HCT: 42.3 % (ref 39.0–52.0)
Hemoglobin: 14.7 g/dL (ref 13.0–17.0)
Immature Granulocytes: 0 %
Lymphocytes Relative: 24 %
Lymphs Abs: 1.8 10*3/uL (ref 0.7–4.0)
MCH: 33.2 pg (ref 26.0–34.0)
MCHC: 34.8 g/dL (ref 30.0–36.0)
MCV: 95.5 fL (ref 80.0–100.0)
Monocytes Absolute: 0.7 10*3/uL (ref 0.1–1.0)
Monocytes Relative: 10 %
Neutro Abs: 4.8 10*3/uL (ref 1.7–7.7)
Neutrophils Relative %: 63 %
Platelets: 245 10*3/uL (ref 150–400)
RBC: 4.43 MIL/uL (ref 4.22–5.81)
RDW: 12.7 % (ref 11.5–15.5)
WBC: 7.5 10*3/uL (ref 4.0–10.5)
nRBC: 0.3 % — ABNORMAL HIGH (ref 0.0–0.2)

## 2020-11-28 LAB — URINALYSIS, COMPLETE (UACMP) WITH MICROSCOPIC
Bilirubin Urine: NEGATIVE
Glucose, UA: NEGATIVE mg/dL
Ketones, ur: 5 mg/dL — AB
Leukocytes,Ua: NEGATIVE
Nitrite: NEGATIVE
Protein, ur: 30 mg/dL — AB
RBC / HPF: >50 RBC/hpf — ABNORMAL HIGH (ref 0–5)
Specific Gravity, Urine: 1.02 (ref 1.005–1.030)
Squamous Epithelial / HPF: NONE SEEN (ref 0–5)
pH: 5 (ref 5.0–8.0)

## 2020-11-28 LAB — COMPREHENSIVE METABOLIC PANEL
ALT: 16 U/L (ref 0–44)
AST: 19 U/L (ref 15–41)
Albumin: 3.8 g/dL (ref 3.5–5.0)
Alkaline Phosphatase: 48 U/L (ref 38–126)
Anion gap: 8 (ref 5–15)
BUN: 19 mg/dL (ref 8–23)
CO2: 25 mmol/L (ref 22–32)
Calcium: 9.2 mg/dL (ref 8.9–10.3)
Chloride: 104 mmol/L (ref 98–111)
Creatinine, Ser: 0.91 mg/dL (ref 0.61–1.24)
GFR, Estimated: >60 mL/min (ref >60)
Glucose, Bld: 151 mg/dL — ABNORMAL HIGH (ref 70–99)
Potassium: 3.7 mmol/L (ref 3.5–5.1)
Sodium: 137 mmol/L (ref 135–145)
Total Bilirubin: 0.9 mg/dL (ref 0.3–1.2)
Total Protein: 6.7 g/dL (ref 6.5–8.1)

## 2020-11-28 LAB — TROPONIN I (HIGH SENSITIVITY): Troponin I (High Sensitivity): 3 ng/L (ref <18)

## 2020-11-28 LAB — LIPASE, BLOOD: Lipase: 32 U/L (ref 11–51)

## 2020-11-28 MED ORDER — TAMSULOSIN HCL 0.4 MG PO CAPS: 0.4000 mg | ORAL_CAPSULE | Freq: Every day | ORAL | 0 refills | Status: AC

## 2020-11-28 MED ORDER — OXYCODONE HCL 5 MG PO TABS: 5.0000 mg | ORAL_TABLET | Freq: Four times a day (QID) | ORAL | 0 refills | Status: AC | PRN

## 2020-11-28 MED ORDER — FENTANYL CITRATE PF 50 MCG/ML IJ SOSY
50.0000 ug | PREFILLED_SYRINGE | Freq: Once | INTRAMUSCULAR | Status: AC
Start: 2020-11-28 — End: 2020-11-28
  Administered 2020-11-28: 50 ug via INTRAVENOUS
  Filled 2020-11-28: qty 1

## 2020-11-28 MED ORDER — MORPHINE SULFATE (PF) 4 MG/ML IV SOLN
4.0000 mg | Freq: Once | INTRAVENOUS | Status: AC
  Administered 2020-11-28: 4 mg via INTRAVENOUS
  Filled 2020-11-28: qty 1

## 2020-11-28 MED ORDER — ONDANSETRON 4 MG PO TBDP: 4.0000 mg | ORAL_TABLET | Freq: Three times a day (TID) | ORAL | 0 refills | Status: DC | PRN

## 2020-11-28 MED ORDER — ONDANSETRON HCL 4 MG/2ML IJ SOLN
4.0000 mg | Freq: Once | INTRAMUSCULAR | Status: AC
  Administered 2020-11-28: 4 mg via INTRAVENOUS
  Filled 2020-11-28: qty 2

## 2020-11-28 NOTE — ED Notes (Signed)
Patient transported to CT 

## 2020-11-28 NOTE — Telephone Encounter (Signed)
Pt c/o medication issue:  1. Name of Medication:  tamsulosin (FLOMAX) 0.4 MG CAPS capsule  2. How are you currently taking this medication (dosage and times per day)?  Patient hasn't started  3. Are you having a reaction (difficulty breathing--STAT)?   4. What is your medication issue?   Patient was prescribed this medication while admitted and he has been advised to take one capsule daily for the next 14 days. He would like to know if Dr. Lovena Le agrees with this. He had concerns that it may cause a medication interaction.

## 2020-11-28 NOTE — Telephone Encounter (Signed)
Pt is calling he was seen he was seen at [facility] and wants to talk to the the nurse in regards to what was done in the hospital

## 2020-11-28 NOTE — ED Provider Notes (Signed)
8:46 AM Assumed care for off going team.   Blood pressure (!) 150/96, pulse 71, temperature 98.3 F (36.8 C), temperature source Oral, resp. rate 19, height '5\' 8"'$  (1.727 m), weight 83.9 kg, SpO2 100 %.  See their HPI for full report but in brief Left flank pain- h/o of afib wit diarrhea and nausea-- not on AC---> CT pending  IMPRESSION:  1. 3 mm calculus in the proximal third of the left ureter just  beyond the left ureteropelvic junction with mild proximal left  hydroureteronephrosis and left perinephric stranding indicating mild  obstruction at this time.  2. 1 mm nonobstructive calculus in the interpolar collecting system  of the left kidney.  3. Colonic diverticulosis without evidence of acute diverticulitis  at this time.  4. Aortic atherosclerosis.  5. Small hiatal hernia.       9:45 AM reevaluated patient he states that his pain is well controlled.  Currently denies any pain.  Declines any further pain medications.  We discussed home with Tylenol, ibuprofen and a course of oxycodone to help with pain,.  Zofran for nausea, Flomax to help with passing the kidney stone.  Patient will be given urology follow-up.  He feels comfortable with this plan.  Recheck temperature remains afebrile.  He understands that if he develops fevers he is to return to the ER emergently.  Otherwise he can follow-up with urology and I have sent Dr. Georgiann Mohs a message to help get closer follow-up       Vanessa [place], MD 11/28/20 [phone]

## 2020-11-28 NOTE — Telephone Encounter (Signed)
Very small chance of decreasing his blood pressure. Not a reason to withhold treatment. Patient should proceed with taking tamsulosin 0.'4mg'$  capsules x 14 days

## 2020-11-28 NOTE — Telephone Encounter (Signed)
Sent response via mychart.

## 2020-11-28 NOTE — Telephone Encounter (Signed)
Call placed to Pt.  Advised ok to take.

## 2020-11-28 NOTE — Discharge Instructions (Addendum)
You have a kidney stone. See report below.   Take ibuprofen '400mg'$  every 8 hours daily (as long as you are not on any other blood thinners or have kidney disease) Take tylenol 1g every 8 hours daily. Take oxycodone for breakthrough pain. Do not drive, work, or operate machinery while on this.  Take zofran to help with nausea. Take Flomax to help dilate uretha. Call urology number above to schedule outpatient appointment. Return to ED for fevers, unable to keep food down, or any other concerns.   IMPRESSION:  1. 3 mm calculus in the proximal third of the left ureter just  beyond the left ureteropelvic junction with mild proximal left  hydroureteronephrosis and left perinephric stranding indicating mild  obstruction at this time.  2. 1 mm nonobstructive calculus in the interpolar collecting system  of the left kidney.  3. Colonic diverticulosis without evidence of acute diverticulitis  at this time.  4. Aortic atherosclerosis.  5. Small hiatal hernia.

## 2020-11-28 NOTE — ED Triage Notes (Signed)
Pt with co left flank pain that started yesterday, denies any hx of kidney stones. Has had some loose stools and nausea. Pt restless in triage with obvious discomfort.

## 2020-11-28 NOTE — Telephone Encounter (Signed)
Returned call to Pt.  Pt had 2 concerns based on lab work done at the hospital today.   His blood sugar was 151.  Pt states he was fasting.  Advised to check with PCP regarding need for further testing.  2.   Aortic atherosclerosis noted on CT-advised should discuss with PCP if he needed to be taking a statin.  Per Pt has not had  his lipids checked recently.  Advised treatment for aortic atherosclerosis would be healthy diet, exercise and possibly a statin for cholesterol.  Pt requests this nurse send note to PCP with above.

## 2020-11-28 NOTE — ED Provider Notes (Signed)
[facility] Emergency Department Provider Note  ____________________________________________  Time seen: Approximately 7:07 AM  I have reviewed the triage vital signs and the nursing notes.   HISTORY  Chief Complaint Flank Pain   HPI Rodney Gray is a 70 y.o. male history of A. fib on Cardizem not anticoagulated, hypertension, hyperlipidemia, PMR who presents for evaluation of flank pain.  Patient reports sudden onset of severe sharp left-sided flank pain radiating down to his abdomen that woke him up from his sleep at 3:30 AM.  Has had nausea but no vomiting.  Also reports having some loose stools associated with it.  Patient was clammy and diaphoretic with the onset of pain.  Complaining of severe pain at this time  Past Medical History:  Diagnosis Date   Allergic rhinitis due to pollen    Atrial fibrillation ([place])    A-Fib, 1987, 2003, 2005, EPS RX 2005 (A-Fib reocurred)   Basal cell carcinoma    skin CA- squamous and Basal cell   BPH (benign prostatic hypertrophy)    Cataract    Hyperlipemia    no per pt   Hypertension    no per pt   Osteoarthritis    Polymyalgia rheumatica ([place])    Squamous cell carcinoma in situ     Patient Active Problem List   Diagnosis Date Noted   Hemorrhoid prolapse 12/25/2019   Polymyalgia rheumatica ([place]) 08/01/2017   Advance directive discussed with patient 09/12/2015   BPH with obstruction/lower urinary tract symptoms    Allergic rhinitis due to pollen    Routine general medical examination at a health care facility 08/19/2010   Atrial fibrillation ([place]) 05/12/2009   OSTEOARTHRITIS 09/13/2006    Past Surgical History:  Procedure Laterality Date   CAPSULOTOMY Bilateral 08/2007   CATARACT EXTRACTION Bilateral    both eyes   COLONOSCOPY     MENISCUS REPAIR Left 09/2009   left knee---Dr [facility]   NASAL SINUS SURGERY  May 2014   RETINAL TEAR REPAIR CRYOTHERAPY Right    right 12/06   RETINAL TEAR  REPAIR CRYOTHERAPY Right    Retinal tear/bleed- Right 12/06   SQUAMOUS CELL CARCINOMA EXCISION Left 07/2008   left forearm   THUMB ARTHROSCOPY Left 1999    Prior to Admission medications   Medication Sig Start Date End Date Taking? Authorizing Provider  aspirin EC 81 MG tablet Take 1 tablet (81 mg total) by mouth daily. 08/11/11   Evans Lance, MD  clindamycin (CLEOCIN) 150 MG capsule Take 4 capsules by mouth as needed. Prior to dental appointments 04/10/19   [provider]  diltiazem (CARDIZEM CD) 180 MG 24 hr capsule TAKE 1 CAPSULE BY MOUTH EVERY DAY 10/27/20   Evans Lance, MD  hydrocortisone (ANUSOL-HC) 2.5 % rectal cream Place 1 application rectally 2 (two) times daily. 12/25/19   Tower, Wynelle Fanny, MD  montelukast (SINGULAIR) 10 MG tablet Take 10 mg by mouth as needed. Allergies    [provider]  Multiple Vitamins-Minerals (MENS MULTIVITAMIN PLUS PO) Take 1 capsule by mouth daily.    [provider]  Omega-3 Fatty Acids (FISH OIL) 1200 MG CAPS Take 1 capsule by mouth daily.    [provider]  predniSONE (DELTASONE) 1 MG tablet Take 4 tablets (4 mg total) by mouth daily with breakfast. Wean as directed 10/17/20   Venia Carbon, MD  predniSONE (DELTASONE) 5 MG tablet TAKE 2 TABLETS (10 MG TOTAL) BY MOUTH EVERY OTHER DAY. WEAN AS DIRECTED  10/09/19   Venia Carbon, MD  Probiotic Product (DIGESTIVE ADVANTAGE GUMMIES PO) Take 1-2 tablets by mouth daily.    [provider]    Allergies Ampicillin  Family History  Problem Relation Age of Onset   Hypertension Father    Cirrhosis Father 14   Alcohol abuse Father    Gallstones Mother    Hyperlipidemia Mother    Esophageal cancer Brother    Asthma Daughter    Coronary artery disease Neg Hx    Diabetes Neg Hx    Cancer Neg Hx        prostate or colon   Colon cancer Neg Hx    Rectal cancer Neg Hx    Stomach cancer Neg Hx     Social History Social History   Tobacco Use   Smoking  status: Never   Smokeless tobacco: Never  Vaping Use   Vaping Use: Never used  Substance Use Topics   Alcohol use: Yes    Comment: occ. 3 per month   Drug use: Never    Review of Systems  Constitutional: Negative for fever. Eyes: Negative for visual changes. ENT: Negative for sore throat. Neck: No neck pain  Cardiovascular: Negative for chest pain. Respiratory: Negative for shortness of breath. Gastrointestinal: Negative for abdominal pain, vomiting . + diarrhea. Genitourinary: Negative for dysuria. + L flank pain Musculoskeletal: Negative for back pain. Skin: Negative for rash. Neurological: Negative for headaches, weakness or numbness. Psych: No SI or HI  ____________________________________________   PHYSICAL EXAM:  VITAL SIGNS: ED Triage Vitals  Enc Vitals Group     BP 11/28/20 0613 (!) 160/89     Pulse Rate 11/28/20 0613 62     Resp 11/28/20 0613 20     Temp 11/28/20 0613 98.3 F (36.8 C)     Temp Source 11/28/20 0613 Oral     SpO2 11/28/20 0613 99 %     Weight 11/28/20 0609 184 lb (83.5 kg)     Height 11/28/20 0609 '5\' 8"'$  (1.727 m)     Head Circumference --      Peak Flow --      Pain Score 11/28/20 0609 0     Pain Loc --      Pain Edu? --      Excl. in [place]? --     Constitutional: Alert and oriented.  Looks uncomfortable due to pain and clammy  HEENT:      Head: Normocephalic and atraumatic.         Eyes: Conjunctivae are normal. Sclera is non-icteric.       Mouth/Throat: Mucous membranes are moist.       Neck: Supple with no signs of meningismus. Cardiovascular: Regular rate and rhythm. No murmurs, gallops, or rubs. 2+ symmetrical distal pulses are present in all extremities. No JVD. Respiratory: Normal respiratory effort. Lungs are clear to auscultation bilaterally.  Gastrointestinal: Soft, non tender, and non distended with positive bowel sounds. No rebound or guarding. Genitourinary: L CVA tenderness. Musculoskeletal:  No edema, cyanosis, or  erythema of extremities. Neurologic: Normal speech and language. Face is symmetric. Moving all extremities. No gross focal neurologic deficits are appreciated. Skin: Skin is warm, dry and intact. No rash noted. Psychiatric: Mood and affect are normal. Speech and behavior are normal.  ____________________________________________   LABS (all labs ordered are listed, but only abnormal results are displayed)  Labs Reviewed  CBC WITH DIFFERENTIAL/PLATELET - Abnormal; Notable for the following components:      Result Value  nRBC 0.3 (*)    All other components within normal limits  COMPREHENSIVE METABOLIC PANEL  LIPASE, BLOOD  URINALYSIS, COMPLETE (UACMP) WITH MICROSCOPIC  TROPONIN I (HIGH SENSITIVITY)   ____________________________________________  EKG  ED ECG REPORT I, Rudene Re, the attending physician, personally viewed and interpreted this ECG.  Atrial fibrillation with a rate of 67, no ST elevations or depressions ____________________________________________  RADIOLOGY  I have personally reviewed the images performed during this visit and I agree with the Radiologist's read.   Interpretation by Radiologist:  No results found.   ____________________________________________   PROCEDURES  Procedure(s) performed:yes .1-3 Lead EKG Interpretation  Date/Time: 11/28/2020 7:08 AM Performed by: Rudene Re, MD Authorized by: Rudene Re, MD     Interpretation: abnormal     ECG rate assessment: normal     Rhythm: atrial fibrillation     Ectopy: none     Conduction: normal    Critical Care performed:  None ____________________________________________   INITIAL IMPRESSION / ASSESSMENT AND PLAN / ED COURSE  70 y.o. male history of A. fib on Cardizem not anticoagulated, hypertension, hyperlipidemia, PMR who presents for evaluation of flank pain.  Patient looks clammy and uncomfortable due to the pain, has some CVA tenderness.  Abdomen is soft with  no tenderness, no pulsatile mass.  EKG showing A. fib with well-controlled rate.  Differential diagnoses including kidney stone versus pyelonephritis versus peptic ulcer disease versus bowel perforation versus SBO versus colitis versus AAA versus dissection  Will treat symptoms with IV fentanyl, IV Zofran and send patient for CT.  Labs are pending.  Imaging and labs pending.  Care transferred to Dr. Jari Pigg      _____________________________________________ Please note:  Patient was evaluated in Emergency Department today for the symptoms described in the history of present illness. Patient was evaluated in the context of the global COVID-19 pandemic, which necessitated consideration that the patient might be at risk for infection with the SARS-CoV-2 virus that causes COVID-19. Institutional protocols and algorithms that pertain to the evaluation of patients at risk for COVID-19 are in a state of rapid change based on information released by regulatory bodies including the CDC and federal and state organizations. These policies and algorithms were followed during the patient's care in the ED.  Some ED evaluations and interventions may be delayed as a result of limited staffing during the pandemic.   [place] Controlled Substance Database was reviewed by me. ____________________________________________   FINAL CLINICAL IMPRESSION(S) / ED DIAGNOSES   Final diagnoses:  Left flank pain      NEW MEDICATIONS STARTED DURING THIS VISIT:  ED Discharge Orders     None        Note:  This document was prepared using Dragon voice recognition software and may include unintentional dictation errors.    Alfred Levins, [place], MD 11/28/20 [phone]

## 2020-11-28 NOTE — Telephone Encounter (Signed)
Spoke to pt. He agrees with the statin medication. Will wait to get the blood sugar checked at last OV.

## 2020-12-02 ENCOUNTER — Emergency Department
Admission: EM | Admit: 2020-12-02 | Discharge: 2020-12-02 | Disposition: A | Discharge status: Home / Self Care | Payer: Medicare Other | Attending: Emergency Medicine | Admitting: Emergency Medicine

## 2020-12-02 ENCOUNTER — Emergency Department: Payer: Medicare Other

## 2020-12-02 ENCOUNTER — Other Ambulatory Visit: Payer: Self-pay

## 2020-12-02 ENCOUNTER — Other Ambulatory Visit: Payer: Self-pay | Admitting: *Deleted

## 2020-12-02 ENCOUNTER — Ambulatory Visit: Payer: Self-pay | Admitting: Urology

## 2020-12-02 DIAGNOSIS — Z85828 Personal history of other malignant neoplasm of skin: Secondary | ICD-10-CM | POA: Diagnosis not present

## 2020-12-02 DIAGNOSIS — N200 Calculus of kidney: Secondary | ICD-10-CM

## 2020-12-02 DIAGNOSIS — Z79899 Other long term (current) drug therapy: Secondary | ICD-10-CM | POA: Diagnosis not present

## 2020-12-02 DIAGNOSIS — I10 Essential (primary) hypertension: Secondary | ICD-10-CM | POA: Insufficient documentation

## 2020-12-02 DIAGNOSIS — Z7982 Long term (current) use of aspirin: Secondary | ICD-10-CM | POA: Diagnosis not present

## 2020-12-02 DIAGNOSIS — R109 Unspecified abdominal pain: Secondary | ICD-10-CM | POA: Diagnosis not present

## 2020-12-02 LAB — URINALYSIS, COMPLETE (UACMP) WITH MICROSCOPIC
Bilirubin Urine: NEGATIVE
Glucose, UA: NEGATIVE mg/dL
Ketones, ur: 20 mg/dL — AB
Leukocytes,Ua: NEGATIVE
Nitrite: NEGATIVE
Protein, ur: 30 mg/dL — AB
RBC / HPF: >50 RBC/hpf — ABNORMAL HIGH (ref 0–5)
Specific Gravity, Urine: 1.028 (ref 1.005–1.030)
Squamous Epithelial / HPF: NONE SEEN (ref 0–5)
WBC, UA: NONE SEEN WBC/hpf (ref 0–5)
pH: 5 (ref 5.0–8.0)

## 2020-12-02 MED ORDER — KETOROLAC TROMETHAMINE 30 MG/ML IJ SOLN
30.0000 mg | Freq: Once | INTRAMUSCULAR | Status: AC
  Administered 2020-12-02: 30 mg via INTRAMUSCULAR
  Filled 2020-12-02: qty 1

## 2020-12-02 MED ORDER — ONDANSETRON 4 MG PO TBDP
4.0000 mg | ORAL_TABLET | Freq: Once | ORAL | Status: AC
  Administered 2020-12-02: 4 mg via ORAL
  Filled 2020-12-02: qty 1

## 2020-12-02 NOTE — ED Provider Notes (Signed)
[facility] Emergency Department Provider Note  ____________________________________________   Event Date/Time   First MD Initiated Contact with Patient 12/02/20 1354     (approximate)  I have reviewed the triage vital signs    HISTORY  Chief Complaint Flank Pain    HPI AADI RICHARDSON is a 70 y.o. male who presents with flank pain.  On review of records patient was here on 8/26 and diagnosed with a 3 mm kidney stone.  He was seen by another provider but then discharged by me at that time his pain was well controlled and we sent him home on some oxycodone, Zofran, Flomax.  Patient was given urology follow-up.  Patient stated that he initially felt fine upon discharge and was doing pretty well over the weekend routine the oxycodone and Tylenol but when he woke up this morning had a little twinge of pain and he took the oxycodone and some Tylenol the pain got much more severe, constant, not same location of the left flank, nothing made it worse.  He states that the pain was so bad he decided come in to the ER.  However since being in the ER the pain has now gone away and is very minimal in nature.  He was post to follow-up for an x-ray today with urology but missed his appointment.     Past Medical History:  Diagnosis Date   Allergic rhinitis due to pollen    Atrial fibrillation ([place])    A-Fib, 1987, 2003, 2005, EPS RX 2005 (A-Fib reocurred)   Basal cell carcinoma    skin CA- squamous and Basal cell   BPH (benign prostatic hypertrophy)    Cataract    Hyperlipemia    no per pt   Hypertension    no per pt   Osteoarthritis    Polymyalgia rheumatica ([place])    Squamous cell carcinoma in situ     Patient Active Problem List   Diagnosis Date Noted   Hemorrhoid prolapse 12/25/2019   Polymyalgia rheumatica ([place]) 08/01/2017   Advance directive discussed with patient 09/12/2015   BPH with obstruction/lower urinary tract symptoms    Allergic rhinitis  due to pollen    Routine general medical examination at a health care facility 08/19/2010   Atrial fibrillation ([place]) 05/12/2009   OSTEOARTHRITIS 09/13/2006    Past Surgical History:  Procedure Laterality Date   CAPSULOTOMY Bilateral 08/2007   CATARACT EXTRACTION Bilateral    both eyes   COLONOSCOPY     MENISCUS REPAIR Left 09/2009   left knee---[facility]   NASAL SINUS SURGERY  May 2014   RETINAL TEAR REPAIR CRYOTHERAPY Right    right 12/06   RETINAL TEAR REPAIR CRYOTHERAPY Right    Retinal tear/bleed- Right 12/06   SQUAMOUS CELL CARCINOMA EXCISION Left 07/2008   left forearm   THUMB ARTHROSCOPY Left 1999    Prior to Admission medications   Medication Sig Start Date End Date Taking? Authorizing Provider  aspirin EC 81 MG tablet Take 1 tablet (81 mg total) by mouth daily. 08/11/11   Evans Lance, MD  clindamycin (CLEOCIN) 150 MG capsule Take 4 capsules by mouth as needed. Prior to dental appointments 04/10/19   [provider]  diltiazem (CARDIZEM CD) 180 MG 24 hr capsule TAKE 1 CAPSULE BY MOUTH EVERY DAY 10/27/20   Evans Lance, MD  hydrocortisone (ANUSOL-HC) 2.5 % rectal cream Place 1 application rectally 2 (two) times daily. 12/25/19   Tower, Wynelle Fanny, MD  montelukast (SINGULAIR) 10 MG tablet Take 10 mg by mouth as needed. Allergies    [provider]  Multiple Vitamins-Minerals (MENS MULTIVITAMIN PLUS PO) Take 1 capsule by mouth daily.    [provider]  Omega-3 Fatty Acids (FISH OIL) 1200 MG CAPS Take 1 capsule by mouth daily.    [provider]  ondansetron (ZOFRAN ODT) 4 MG disintegrating tablet Take 1 tablet (4 mg total) by mouth every 8 (eight) hours as needed for nausea or vomiting. 11/28/20   Vanessa Diamond, MD  oxyCODONE (ROXICODONE) 5 MG immediate release tablet Take 1 tablet (5 mg total) by mouth every 6 (six) hours as needed for up to 5 days. 11/28/20 12/03/20  Vanessa [place], MD  predniSONE (DELTASONE) 1 MG tablet Take 4 tablets (4 mg  total) by mouth daily with breakfast. Wean as directed 10/17/20   Venia Carbon, MD  predniSONE (DELTASONE) 5 MG tablet TAKE 2 TABLETS (10 MG TOTAL) BY MOUTH EVERY OTHER DAY. WEAN AS DIRECTED 10/09/19   Venia Carbon, MD  Probiotic Product (DIGESTIVE ADVANTAGE GUMMIES PO) Take 1-2 tablets by mouth daily.    [provider]  tamsulosin (FLOMAX) 0.4 MG CAPS capsule Take 1 capsule (0.4 mg total) by mouth daily for 14 days. 11/28/20 12/12/20  Vanessa Spicer, MD    Allergies Ampicillin  Family History  Problem Relation Age of Onset   Hypertension Father    Cirrhosis Father 70   Alcohol abuse Father    Gallstones Mother    Hyperlipidemia Mother    Esophageal cancer Brother    Asthma Daughter    Coronary artery disease Neg Hx    Diabetes Neg Hx    Cancer Neg Hx        prostate or colon   Colon cancer Neg Hx    Rectal cancer Neg Hx    Stomach cancer Neg Hx     Social History Social History   Tobacco Use   Smoking status: Never   Smokeless tobacco: Never  Vaping Use   Vaping Use: Never used  Substance Use Topics   Alcohol use: Yes    Comment: occ. 3 per month   Drug use: Never      Review of Systems Constitutional: No fever/chills Eyes: No visual changes. ENT: No sore throat. Cardiovascular: Denies chest pain. Respiratory: no SOB. No cough Gastrointestinal: No abdominal pain.  No nausea, no vomiting.  No diarrhea.  No constipation. Genitourinary: no severe blood in urine Musculoskeletal: + flank pain Skin: Negative for rash. Neurological: Negative for headaches, focal weakness or numbness. All other ROS negative ____________________________________________   PHYSICAL EXAM:  VITAL SIGNS: ED Triage Vitals [12/02/20 1201]  Enc Vitals Group     BP (!) 158/89     Pulse Rate (!) 54     Resp 18     Temp 97.7 F (36.5 C)     Temp Source Oral     SpO2 99 %     Weight 185 lb (83.9 kg)     Height '5\' 8"'$  (1.727 m)     Head Circumference      Peak Flow       Pain Score 9     Pain Loc      Pain Edu?      Excl. in [place]?     Constitutional: Alert and oriented. Well appearing  Eyes: Conjunctivae are normal. EOMI. Head: Atraumatic. Nose: No congestion/rhinnorhea. Mouth/Throat: Mucous membranes are moist.   Neck: No  stridor. Trachea Midline. FROM Cardiovascular: Normal rate, regular rhythm.  Good peripheral circulation. Respiratory: no audible stridor, work of breathing  Gastrointestinal: Soft and nontender. No distention.  Musculoskeletal: No lower extremity tenderness nor edema.  No joint effusions. Neurologic:  Normal speech and language. No gross focal neurologic deficits are appreciated.  Skin:  Skin is warm, dry and intact. No rash noted. Psychiatric: Mood and affect are normal. Speech and behavior are normal. GU: Deferred  Flank tenderness.  ____________________________________________   LABS (all labs ordered are listed, but only abnormal results are displayed)  Labs Reviewed  URINALYSIS, COMPLETE (UACMP) WITH MICROSCOPIC - Abnormal; Notable for the following components:      Result Value   Color, Urine YELLOW (*)    APPearance CLOUDY (*)    Hgb urine dipstick LARGE (*)    Ketones, ur 20 (*)    Protein, ur 30 (*)    RBC / HPF >50 (*)    Bacteria, UA RARE (*)    All other components within normal limits   ____________________________________________   RADIOLOGY Robert Bellow, personally viewed and evaluated these images (plain radiographs) as part of my medical decision making, as well as reviewing the written report by the radiologist.  ED MD interpretation:  no kidney stone noted  Official radiology report(s): DG Abdomen 1 View  Result Date: 12/02/2020 CLINICAL DATA:  Left-sided flank pain.  History of kidney stones. EXAM: ABDOMEN - 1 VIEW COMPARISON:  Abdominopelvic CT 11/28/2020. FINDINGS: The bowel gas pattern is normal. The previously demonstrated tiny proximal left ureteral calculus was not well seen on the CT  scout image due to size and overlying EKG leads. This makes assessment for persistence or passage difficult. There is a potential tiny calcification inferior to the left L4 spinous process which is the general area of the calculus on previous CT. Pelvic calcifications are likely vascular and unchanged. There are mild degenerative changes in the spine. IMPRESSION: Study is indeterminate for passage of the previously demonstrated proximal left ureteral calculus given the small size of the calculus and overlying EKG leads on prior CT. Electronically Signed   By: Richardean Sale M.D.   On: 12/02/2020 14:48    ____________________________________________   PROCEDURES  Procedure(s) performed (including Critical Care):  Procedures   ____________________________________________   INITIAL IMPRESSION / ASSESSMENT AND PLAN / ED COURSE  Dravyn Redwood [place] was evaluated in Emergency Department on 12/02/2020 for the symptoms described in the history of present illness. He was evaluated in the context of the global COVID-19 pandemic, which necessitated consideration that the patient might be at risk for infection with the SARS-CoV-2 virus that causes COVID-19. Institutional protocols and algorithms that pertain to the evaluation of patients at risk for COVID-19 are in a state of rapid change based on information released by regulatory bodies including the CDC and federal and state organizations. These policies and algorithms were followed during the patient's care in the ED.     Pt presents with flank pain.  With known kidney stone.  Patient does not have any signs of infected kidney stone on UA and he remains afebrile.  Patient's pain is much better after being in the waiting room for over 2 hours so we will give a dose of Toradol given normal creatinine last time he was here.  We discussed doing a repeat x-ray to see if he did pass the stone.  Abdomen soft nontender and low suspicion for other acute  pathology given the known kidney stone  on recent CT imaging.  His urine still shows RBCs in it  Xray unclear if kidney stone passed.  2:59 PM reevaluated patient and she continues to deny any pain.  This time she feels comfortable going home and continue his regimen of pain medicine at home and will call urology to get a new follow-up appointment   I discussed the provisional nature of ED diagnosis, the treatment so far, the ongoing plan of care, follow up appointments and return precautions with the patient and any family or support people present. They expressed understanding and agreed with the plan, discharged home.          ____________________________________________   FINAL CLINICAL IMPRESSION(S) / ED DIAGNOSES   Final diagnoses:  Kidney stone      MEDICATIONS GIVEN DURING THIS VISIT:  Medications  ondansetron (ZOFRAN-ODT) disintegrating tablet 4 mg (4 mg Oral Given 12/02/20 1204)  ketorolac (TORADOL) 30 MG/ML injection 30 mg (30 mg Intramuscular Given 12/02/20 1444)     ED Discharge Orders     None        Note:  This document was prepared using Dragon voice recognition software and may include unintentional dictation errors.   Vanessa [place], MD 12/02/20 1500

## 2020-12-02 NOTE — ED Triage Notes (Signed)
Pt here with L side flank pain. Pt was seen here for same problem on Friday and was told that he had a kidney stone. Pt states that the pain has not gotten better and he is still nauseous.

## 2020-12-02 NOTE — ED Notes (Signed)
Pt states was diagnosed at this ER with L renal stone 5d ago. Pt back to ER for L flank pain that was 9/10 but now 2/10 (dull ache). Pt states pain was severe while in waiting room, but he thinks stone may have moved because his pain spontaneously subsided. Was nauseous, now denies NVD. Wife at bedside. Ambulatory to room.

## 2020-12-02 NOTE — Discharge Instructions (Addendum)
Continue taking the Tylenol 1 g every 8 hours and the oxycodone to help with pain.  Return the ER for fevers or any other concerns

## 2020-12-05 ENCOUNTER — Telehealth: Payer: Self-pay

## 2020-12-05 NOTE — Telephone Encounter (Signed)
Tried calling patient and checking on him yesterday, was unable to reach patient he did not answer his phone however I did leave a VM for the patient to call back.

## 2020-12-09 ENCOUNTER — Encounter: Payer: Self-pay | Admitting: Urology

## 2020-12-09 ENCOUNTER — Ambulatory Visit (INDEPENDENT_AMBULATORY_CARE_PROVIDER_SITE_OTHER): Payer: Medicare Other | Admitting: Urology

## 2020-12-09 ENCOUNTER — Other Ambulatory Visit: Payer: Self-pay

## 2020-12-09 ENCOUNTER — Other Ambulatory Visit
Admission: RE | Admit: 2020-12-09 | Discharge: 2020-12-09 | Disposition: A | Discharge status: Home / Self Care | Payer: Medicare Other | Attending: Urology | Admitting: Urology

## 2020-12-09 ENCOUNTER — Other Ambulatory Visit: Payer: Self-pay | Admitting: *Deleted

## 2020-12-09 VITALS — BP 145/61 | HR 68 | Ht 68.0 in | Wt 187.0 lb

## 2020-12-09 DIAGNOSIS — N3281 Overactive bladder: Secondary | ICD-10-CM | POA: Diagnosis not present

## 2020-12-09 DIAGNOSIS — N200 Calculus of kidney: Secondary | ICD-10-CM

## 2020-12-09 LAB — URINALYSIS, COMPLETE (UACMP) WITH MICROSCOPIC
Bacteria, UA: NONE SEEN
Bilirubin Urine: NEGATIVE
Glucose, UA: NEGATIVE mg/dL
Hgb urine dipstick: NEGATIVE
Ketones, ur: NEGATIVE mg/dL
Leukocytes,Ua: NEGATIVE
Nitrite: NEGATIVE
Protein, ur: NEGATIVE mg/dL
Specific Gravity, Urine: 1.01 (ref 1.005–1.030)
pH: 5.5 (ref 5.0–8.0)

## 2020-12-09 NOTE — Patient Instructions (Signed)
Dietary Guidelines to Help Prevent Kidney Stones Kidney stones are deposits of minerals and salts that form inside your kidneys. Your risk of developing kidney stones may be greater depending on your diet, your lifestyle, the medicines you take, and whether you have certain medical conditions. Most people can lower their chances of developing kidney stones by following the instructions below. Your dietitian may give you more specific instructions depending on your overall health and the type of kidney stones you tend to develop. What are tips for following this plan? Reading food labels  Choose foods with "no salt added" or "low-salt" labels. Limit your salt (sodium) intake to less than 1,500 mg a day. Choose foods with calcium for each meal and snack. Try to eat about 300 mg of calcium at each meal. Foods that contain 200-500 mg of calcium a serving include: 8 oz (237 mL) of milk, calcium-fortifiednon-dairy milk, and calcium-fortifiedfruit juice. Calcium-fortified means that calcium has been added to these drinks. 8 oz (237 mL) of kefir, yogurt, and soy yogurt. 4 oz (114 g) of tofu. 1 oz (28 g) of cheese. 1 cup (150 g) of dried figs. 1 cup (91 g) of cooked broccoli. One 3 oz (85 g) can of sardines or mackerel. Most people need 1,000-1,500 mg of calcium a day. Talk to your dietitian about how much calcium is recommended for you. Shopping Buy plenty of fresh fruits and vegetables. Most people do not need to avoid fruits and vegetables, even if these foods contain nutrients that may contribute to kidney stones. When shopping for convenience foods, choose: Whole pieces of fruit. Pre-made salads with dressing on the side. Low-fat fruit and yogurt smoothies. Avoid buying frozen meals or prepared deli foods. These can be high in sodium. Look for foods with live cultures, such as yogurt and kefir. Choose high-fiber grains, such as whole-wheat breads, oat bran, and wheat cereals. Cooking Do not add  salt to food when cooking. Place a salt shaker on the table and allow each person to add his or her own salt to taste. Use vegetable protein, such as beans, textured vegetable protein (TVP), or tofu, instead of meat in pasta, casseroles, and soups. Meal planning Eat less salt, if told by your dietitian. To do this: Avoid eating processed or pre-made food. Avoid eating fast food. Eat less animal protein, including cheese, meat, poultry, or fish, if told by your dietitian. To do this: Limit the number of times you have meat, poultry, fish, or cheese each week. Eat a diet free of meat at least 2 days a week. Eat only one serving each day of meat, poultry, fish, or seafood. When you prepare animal protein, cut pieces into small portion sizes. For most meat and fish, one serving is about the size of the palm of your hand. Eat at least five servings of fresh fruits and vegetables each day. To do this: Keep fruits and vegetables on hand for snacks. Eat one piece of fruit or a handful of berries with breakfast. Have a salad and fruit at lunch. Have two kinds of vegetables at dinner. Limit foods that are high in a substance called oxalate. These include: Spinach (cooked), rhubarb, beets, sweet potatoes, and Swiss chard. Peanuts. Potato chips, french fries, and baked potatoes with skin on. Nuts and nut products. Chocolate. If you regularly take a diuretic medicine, make sure to eat at least 1 or 2 servings of fruits or vegetables that are high in potassium each day. These include: Avocado. Banana. Orange, prune,   carrot, or tomato juice. Baked potato. Cabbage. Beans and split peas. Lifestyle  Drink enough fluid to keep your urine pale yellow. This is the most important thing you can do. Spread your fluid intake throughout the day. If you drink alcohol: Limit how much you use to: 0-1 drink a day for women who are not pregnant. 0-2 drinks a day for men. Be aware of how much alcohol is in your  drink. In the [place] one drink equals one 12 oz bottle of beer (355 mL), one 5 oz glass of wine (148 mL), or one 1 oz glass of hard liquor (44 mL). Lose weight if told by your health care provider. Work with your dietitian to find an eating plan and weight loss strategies that work best for you. General information Talk to your health care provider and dietitian about taking daily supplements. You may be told the following depending on your health and the cause of your kidney stones: Not to take supplements with vitamin C. To take a calcium supplement. To take a daily probiotic supplement. To take other supplements such as magnesium, fish oil, or vitamin B6. Take over-the-counter and prescription medicines only as told by your health care provider. These include supplements. What foods should I limit? Limit your intake of the following foods, or eat them as told by your dietitian. Vegetables Spinach. Rhubarb. Beets. Canned vegetables. Angie Fava. Olives. Baked potatoes with skin. Grains Wheat bran. Baked goods. Salted crackers. Cereals high in sugar. Meats and other proteins Nuts. Nut butters. Large portions of meat, poultry, or fish. Salted, precooked, or cured meats, such as sausages, meat loaves, and hot dogs. Dairy Cheese. Beverages Regular soft drinks. Regular vegetable juice. Seasonings and condiments Seasoning blends with salt. Salad dressings. Soy sauce. Ketchup. Barbecue sauce. Other foods Canned soups. Canned pasta sauce. Casseroles. Pizza. Lasagna. Frozen meals. Potato chips. [place] fries. The items listed above may not be a complete list of foods and beverages you should limit. Contact a dietitian for more information. What foods should I avoid? Talk to your dietitian about specific foods you should avoid based on the type of kidney stones you have and your overall health. Fruits Grapefruit. The item listed above may not be a complete list of foods and beverages you should  avoid. Contact a dietitian for more information. Summary Kidney stones are deposits of minerals and salts that form inside your kidneys. You can lower your risk of kidney stones by making changes to your diet. The most important thing you can do is drink enough fluid. Drink enough fluid to keep your urine pale yellow. Talk to your dietitian about how much calcium you should have each day, and eat less salt and animal protein as told by your dietitian. This information is not intended to replace advice given to you by your health care provider. Make sure you discuss any questions you have with your health care provider. Document Revised: 03/15/2019 Document Reviewed: 03/15/2019 Elsevier Patient Education  2022 [place].  Overactive Bladder, Adult Overactive bladder is a condition in which a person has a sudden and frequent need to urinate. A person might also leak urine if he or she cannot get to the bathroom fast enough (urinary incontinence). Sometimes, symptoms can interfere with work or social activities. What are the causes? Overactive bladder is associated with poor nerve signals between your bladder and your brain. Your bladder may get the signal to empty before it is full. You may also have very sensitive muscles that make  your bladder squeeze too soon. This condition may also be caused by other factors, such as: Medical conditions: Urinary tract infection. Infection of nearby tissues. Prostate enlargement. Bladder stones, inflammation, or tumors. Diabetes. Muscle or nerve weakness, especially from these conditions: A spinal cord injury. Stroke. Multiple sclerosis. Parkinson's disease. Other causes: Surgery on the uterus or urethra. Drinking too much caffeine or alcohol. Certain medicines, especially those that eliminate extra fluid in the body (diuretics). Constipation. What increases the risk? You may be at greater risk for overactive bladder if you: Are an older  adult. Smoke. Are going through menopause. Have prostate problems. Have a neurological disease, such as stroke, dementia, Parkinson's disease, or multiple sclerosis (MS). Eat or drink alcohol, spicy food, caffeine, and other things that irritate the bladder. Are overweight or obese. What are the signs or symptoms? Symptoms of this condition include a sudden, strong urge to urinate. Other symptoms include: Leaking urine. Urinating 8 or more times a day. Waking up to urinate 2 or more times overnight. How is this diagnosed? This condition may be diagnosed based on: Your symptoms and medical history. A physical exam. Blood or urine tests to check for possible causes, such as infection. You may also need to see a health care provider who specializes in urinary tract problems. This is called a urologist. How is this treated? Treatment for overactive bladder depends on the cause of your condition and whether it is mild or severe. Treatment may include: Bladder training, such as: Learning to control the urge to urinate by following a schedule to urinate at regular intervals. Doing Kegel exercises to strengthen the pelvic floor muscles that support your bladder. Special devices, such as: Biofeedback. This uses sensors to help you become aware of your body's signals. Electrical stimulation. This uses electrodes placed inside the body (implanted) or outside the body. These electrodes send gentle pulses of electricity to strengthen the nerves or muscles that control the bladder. Women may use a plastic device, called a pessary, that fits into the vagina and supports the bladder. Medicines, such as: Antibiotics to treat bladder infection. Antispasmodics to stop the bladder from releasing urine at the wrong time. Tricyclic antidepressants to relax bladder muscles. Injections of botulinum toxin type A directly into the bladder tissue to relax bladder muscles. Surgery, such as: A device may be  implanted to help manage the nerve signals that control urination. An electrode may be implanted to stimulate electrical signals in the bladder. A procedure may be done to change the shape of the bladder. This is done only in very severe cases. Follow these instructions at home: Eating and drinking  Make diet or lifestyle changes recommended by your health care provider. These may include: Drinking fluids throughout the day and not only with meals. Cutting down on caffeine or alcohol. Eating a healthy and balanced diet to prevent constipation. This may include: Choosing foods that are high in fiber, such as beans, whole grains, and fresh fruits and vegetables. Limiting foods that are high in fat and processed sugars, such as fried and sweet foods. Lifestyle  Lose weight if needed. Do not use any products that contain nicotine or tobacco. These include cigarettes, chewing tobacco, and vaping devices, such as e-cigarettes. If you need help quitting, ask your health care provider. General instructions Take over-the-counter and prescription medicines only as told by your health care provider. If you were prescribed an antibiotic medicine, take it as told by your health care provider. Do not stop taking the antibiotic  even if you start to feel better. Use any implants or pessary as told by your health care provider. If needed, wear pads to absorb urine leakage. Keep a log to track how much and when you drink, and when you need to urinate. This will help your health care provider monitor your condition. Keep all follow-up visits. This is important. Contact a health care provider if: You have a fever or chills. Your symptoms do not get better with treatment. Your pain and discomfort get worse. You have more frequent urges to urinate. Get help right away if: You are not able to control your bladder. Summary Overactive bladder refers to a condition in which a person has a sudden and frequent  need to urinate. Several conditions may lead to an overactive bladder. Treatment for overactive bladder depends on the cause and severity of your condition. Making lifestyle changes, doing Kegel exercises, keeping a log, and taking medicines can help with this condition. This information is not intended to replace advice given to you by your health care provider. Make sure you discuss any questions you have with your health care provider. Document Revised: 12/10/2019 Document Reviewed: 12/10/2019 Elsevier Patient Education  [place].

## 2020-12-09 NOTE — Progress Notes (Signed)
12/09/20 12:52 PM   Rodney Gray Feb 09, 1951 [id number]  CC: Left ureteral stone, nocturia  HPI: 70 year old male with no history of nephrolithiasis who presented to the ED initially on 11/28/2020 with left-sided flank pain and CT showed a 3 mm left proximal ureteral stone.  Urinalysis was noninfected, and he was discharged with medical expulsive therapy.  He returned to the ED a few days later on 12/02/2020 with recurrence of his pain, and KUB showed no definite evidence of stone, but urine showed persistent microscopic hematuria.  His pain has resolved over the last 4 to 5 days, and he denies any complaint today.  Urinalysis today is completely benign with no microscopic hematuria.  Regarding his urinary symptoms at baseline, he has nocturia 1-2 times per night that is mildly bothersome.  He denies significant urinary problems during the day aside from occasional urgency.   PMH: Past Medical History:  Diagnosis Date   Allergic rhinitis due to pollen    Atrial fibrillation ([place])    A-Fib, 1987, 2003, 2005, EPS RX 2005 (A-Fib reocurred)   Basal cell carcinoma    skin CA- squamous and Basal cell   BPH (benign prostatic hypertrophy)    Cataract    Hyperlipemia    no per pt   Hypertension    no per pt   Osteoarthritis    Polymyalgia rheumatica ([place])    Squamous cell carcinoma in situ     Surgical History: Past Surgical History:  Procedure Laterality Date   CAPSULOTOMY Bilateral 08/2007   CATARACT EXTRACTION Bilateral    both eyes   COLONOSCOPY     MENISCUS REPAIR Left 09/2009   left knee---Dr [facility]   NASAL SINUS SURGERY  May 2014   RETINAL TEAR REPAIR CRYOTHERAPY Right    right 12/06   RETINAL TEAR REPAIR CRYOTHERAPY Right    Retinal tear/bleed- Right 12/06   SQUAMOUS CELL CARCINOMA EXCISION Left 07/2008   left forearm   THUMB ARTHROSCOPY Left 1999    Family History: Family History  Problem Relation Age of Onset   Hypertension Father    Cirrhosis  Father 6   Alcohol abuse Father    Gallstones Mother    Hyperlipidemia Mother    Esophageal cancer Brother    Asthma Daughter    Coronary artery disease Neg Hx    Diabetes Neg Hx    Cancer Neg Hx        prostate or colon   Colon cancer Neg Hx    Rectal cancer Neg Hx    Stomach cancer Neg Hx     Social History:  reports that he has never smoked. He has never used smokeless tobacco. He reports current alcohol use. He reports that he does not use drugs.  Physical Exam: BP (!) 145/61   Pulse 68   Ht '5\' 8"'$  (1.727 m)   Wt 187 lb (84.8 kg)   BMI 28.43 kg/m    Constitutional:  Alert and oriented, No acute distress. Cardiovascular: No clubbing, cyanosis, or edema. Respiratory: Normal respiratory effort, no increased work of breathing. GI: Abdomen is soft, nontender, nondistended, no abdominal masses  Laboratory Data: Reviewed, see HPI  Pertinent Imaging: I have personally viewed and interpreted the CT showing a 3 mm left proximal ureteral stone.  Assessment & Plan:   70 year old male with likely spontaneously passed 3 mm left ureteral stone.  We discussed the greater than 90% spontaneous passage rate for stones <57m.  We discussed that this cannot be 100% confirmed  without CT, but the resolution of his symptoms and benign urinalysis are certainly reassuring.  Return precautions discussed at length.  Regarding his urinary symptoms, we recommended behavioral strategies including avoiding bladder irritants, voiding prior to bedtime, and minimizing fluids for 3 to 4 hours before bed.  RTC 1 year with KUB for stone surveillance, IPSS, PVR for urinary symptoms  Nickolas Madrid, MD 12/09/2020  [facility] [address] [place], [place] [address] [phone]

## 2020-12-10 DIAGNOSIS — M9903 Segmental and somatic dysfunction of lumbar region: Secondary | ICD-10-CM | POA: Diagnosis not present

## 2020-12-10 DIAGNOSIS — M9902 Segmental and somatic dysfunction of thoracic region: Secondary | ICD-10-CM | POA: Diagnosis not present

## 2020-12-10 DIAGNOSIS — M62830 Muscle spasm of back: Secondary | ICD-10-CM | POA: Diagnosis not present

## 2020-12-10 DIAGNOSIS — M5136 Other intervertebral disc degeneration, lumbar region: Secondary | ICD-10-CM | POA: Diagnosis not present

## 2020-12-30 DIAGNOSIS — H33301 Unspecified retinal break, right eye: Secondary | ICD-10-CM | POA: Diagnosis not present

## 2020-12-30 DIAGNOSIS — H40013 Open angle with borderline findings, low risk, bilateral: Secondary | ICD-10-CM | POA: Diagnosis not present

## 2020-12-30 DIAGNOSIS — Z961 Presence of intraocular lens: Secondary | ICD-10-CM | POA: Diagnosis not present

## 2020-12-30 DIAGNOSIS — H04123 Dry eye syndrome of bilateral lacrimal glands: Secondary | ICD-10-CM | POA: Diagnosis not present

## 2021-01-21 DIAGNOSIS — M62830 Muscle spasm of back: Secondary | ICD-10-CM | POA: Diagnosis not present

## 2021-01-21 DIAGNOSIS — M5136 Other intervertebral disc degeneration, lumbar region: Secondary | ICD-10-CM | POA: Diagnosis not present

## 2021-01-21 DIAGNOSIS — M9902 Segmental and somatic dysfunction of thoracic region: Secondary | ICD-10-CM | POA: Diagnosis not present

## 2021-01-21 DIAGNOSIS — M9903 Segmental and somatic dysfunction of lumbar region: Secondary | ICD-10-CM | POA: Diagnosis not present

## 2021-02-18 DIAGNOSIS — M9903 Segmental and somatic dysfunction of lumbar region: Secondary | ICD-10-CM | POA: Diagnosis not present

## 2021-02-18 DIAGNOSIS — M5136 Other intervertebral disc degeneration, lumbar region: Secondary | ICD-10-CM | POA: Diagnosis not present

## 2021-02-18 DIAGNOSIS — M62830 Muscle spasm of back: Secondary | ICD-10-CM | POA: Diagnosis not present

## 2021-02-18 DIAGNOSIS — M9902 Segmental and somatic dysfunction of thoracic region: Secondary | ICD-10-CM | POA: Diagnosis not present

## 2021-03-11 ENCOUNTER — Encounter: Payer: Self-pay | Admitting: Family

## 2021-03-11 ENCOUNTER — Ambulatory Visit (INDEPENDENT_AMBULATORY_CARE_PROVIDER_SITE_OTHER): Payer: Medicare Other | Admitting: Family

## 2021-03-11 ENCOUNTER — Other Ambulatory Visit: Payer: Self-pay

## 2021-03-11 VITALS — BP 134/76 | HR 80 | Temp 97.8°F | Ht 68.0 in | Wt 186.0 lb

## 2021-03-11 DIAGNOSIS — J40 Bronchitis, not specified as acute or chronic: Secondary | ICD-10-CM | POA: Insufficient documentation

## 2021-03-11 DIAGNOSIS — J029 Acute pharyngitis, unspecified: Secondary | ICD-10-CM | POA: Insufficient documentation

## 2021-03-11 DIAGNOSIS — J301 Allergic rhinitis due to pollen: Secondary | ICD-10-CM | POA: Diagnosis not present

## 2021-03-11 DIAGNOSIS — J309 Allergic rhinitis, unspecified: Secondary | ICD-10-CM

## 2021-03-11 LAB — POCT RAPID STREP A (OFFICE): Rapid Strep A Screen: NEGATIVE

## 2021-03-11 MED ORDER — DOXYCYCLINE HYCLATE 100 MG PO CAPS: 100.0000 mg | ORAL_CAPSULE | Freq: Two times a day (BID) | ORAL | 0 refills | Status: AC

## 2021-03-11 MED ORDER — MONTELUKAST SODIUM 10 MG PO TABS: 10.0000 mg | ORAL_TABLET | ORAL | 1 refills | Status: DC | PRN

## 2021-03-11 NOTE — Assessment & Plan Note (Signed)
Continue singulair 

## 2021-03-11 NOTE — Progress Notes (Signed)
Established Patient Office Visit  Subjective:  Patient ID: Rodney Gray, male    DOB: 05/25/1950  Age: 70 y.o. MRN: [id number]  CC:  Chief Complaint  Patient presents with   Cough   Sore Throat   Generalized Body Aches    HPI Rodney Gray is here today with c/o six day h/o sore throat, body aches intermittently, nasal congestion, stuffy nose. Wet productive cough alternating with hacking dry cough. Sob with coughing fits. Some slight chest congestion.   No fever, no chills, no sinus pressure and or ear pain. no wheezing. Covid negative 12/6.   Just came back from [place] in [place].  Covid negative   Past Medical History:  Diagnosis Date   Allergic rhinitis due to pollen    Atrial fibrillation ([place])    A-Fib, 1987, 2003, 2005, EPS RX 2005 (A-Fib reocurred)   Basal cell carcinoma    skin CA- squamous and Basal cell   BPH (benign prostatic hypertrophy)    Cataract    Hyperlipemia    no per pt   Hypertension    no per pt   Osteoarthritis    Polymyalgia rheumatica ([place])    Squamous cell carcinoma in situ     Past Surgical History:  Procedure Laterality Date   CAPSULOTOMY Bilateral 08/2007   CATARACT EXTRACTION Bilateral    both eyes   COLONOSCOPY     MENISCUS REPAIR Left 09/2009   left knee---Dr [facility]   NASAL SINUS SURGERY  May 2014   RETINAL TEAR REPAIR CRYOTHERAPY Right    right 12/06   RETINAL TEAR REPAIR CRYOTHERAPY Right    Retinal tear/bleed- Right 12/06   SQUAMOUS CELL CARCINOMA EXCISION Left 07/2008   left forearm   THUMB ARTHROSCOPY Left 1999    Family History  Problem Relation Age of Onset   Hypertension Father    Cirrhosis Father 97   Alcohol abuse Father    Gallstones Mother    Hyperlipidemia Mother    Esophageal cancer Brother    Asthma Daughter    Coronary artery disease Neg Hx    Diabetes Neg Hx    Cancer Neg Hx        prostate or colon   Colon cancer Neg Hx    Rectal cancer Neg Hx    Stomach cancer Neg  Hx     Social History   Socioeconomic History   Marital status: Married    Spouse name: Not on file   Number of children: 3   Years of education: Not on file   Highest education level: Not on file  Occupational History   Occupation: retired Primary school teacher: RETIRED  Tobacco Use   Smoking status: Never   Smokeless tobacco: Never  Vaping Use   Vaping Use: Never used  Substance and Sexual Activity   Alcohol use: Yes    Comment: occ. 3 per month   Drug use: Never   Sexual activity: Not on file  Other Topics Concern   Not on file  Social History Narrative   Has living will   Wife is health care POA--alternate is son [name]   Would accept resuscitation attempts   Would accept tube feeds for some time--depending on prognosis   Social Determinants of Health   Financial Resource Strain: Not on file  Food Insecurity: Not on file  Transportation Needs: Not on file  Physical Activity: Not on file  Stress: Not on file  Social Connections:  Not on file  Intimate Partner Violence: Not on file    Outpatient Medications Prior to Visit  Medication Sig Dispense Refill   aspirin EC 81 MG tablet Take 1 tablet (81 mg total) by mouth daily. 90 tablet 3   clindamycin (CLEOCIN) 150 MG capsule Take 4 capsules by mouth as needed. Prior to dental appointments     diltiazem (CARDIZEM CD) 180 MG 24 hr capsule TAKE 1 CAPSULE BY MOUTH EVERY DAY 90 capsule 2   hydrocortisone (ANUSOL-HC) 2.5 % rectal cream Place 1 application rectally 2 (two) times daily. 30 g 0   Multiple Vitamins-Minerals (MENS MULTIVITAMIN PLUS PO) Take 1 capsule by mouth daily.     Omega-3 Fatty Acids (FISH OIL) 1200 MG CAPS Take 1 capsule by mouth daily.     ondansetron (ZOFRAN ODT) 4 MG disintegrating tablet Take 1 tablet (4 mg total) by mouth every 8 (eight) hours as needed for nausea or vomiting. 20 tablet 0   predniSONE (DELTASONE) 1 MG tablet Take 4 tablets (4 mg total) by mouth daily with breakfast.  Wean as directed 360 tablet 3   predniSONE (DELTASONE) 5 MG tablet TAKE 2 TABLETS (10 MG TOTAL) BY MOUTH EVERY OTHER DAY. WEAN AS DIRECTED 100 tablet 3   Probiotic Product (DIGESTIVE ADVANTAGE GUMMIES PO) Take 1-2 tablets by mouth daily.     montelukast (SINGULAIR) 10 MG tablet Take 10 mg by mouth as needed. Allergies     No facility-administered medications prior to visit.    Allergies  Allergen Reactions   Ampicillin     REACTION: hives Has tolerated cephalosporins    ROS Review of Systems  Constitutional:  Negative for chills and fever.  HENT:  Negative for congestion, ear pain, sinus pain and sore throat.   Respiratory:  Negative for cough, shortness of breath and wheezing.   Cardiovascular:  Negative for chest pain and palpitations.     Objective:    Physical Exam Constitutional:      General: He is awake. He is not in acute distress.    Appearance: Normal appearance. He is not ill-appearing.  HENT:     Right Ear: Tympanic membrane normal.     Left Ear: Tympanic membrane normal.     Nose: Nose normal.     Right Turbinates: Not enlarged or swollen.     Left Turbinates: Not enlarged or swollen.     Right Sinus: No maxillary sinus tenderness or frontal sinus tenderness.     Left Sinus: No maxillary sinus tenderness or frontal sinus tenderness.     Mouth/Throat:     Mouth: Mucous membranes are moist.     Pharynx: No pharyngeal swelling, oropharyngeal exudate or posterior oropharyngeal erythema.  Eyes:     Extraocular Movements: Extraocular movements intact.     Pupils: Pupils are equal, round, and reactive to light.  Cardiovascular:     Rate and Rhythm: Normal rate and regular rhythm.  Pulmonary:     Effort: Pulmonary effort is normal.     Breath sounds: Normal breath sounds. No wheezing.  Neurological:     Mental Status: He is alert.    BP 134/76   Pulse 80   Temp 97.8 F (36.6 C) (Temporal)   Ht 5\' 8"  (1.727 m)   Wt 186 lb (84.4 kg)   SpO2 97%   BMI 28.28  kg/m  Wt Readings from Last 3 Encounters:  03/11/21 186 lb (84.4 kg)  12/09/20 187 lb (84.8 kg)  12/02/20 185 lb (83.9 kg)  Health Maintenance Due  Topic Date Due   Hepatitis C Screening  Never done   Zoster Vaccines- Shingrix (1 of 2) Never done   INFLUENZA VACCINE  11/03/2020    There are no preventive care reminders to display for this patient.  Lab Results  Component Value Date   TSH 1.51 08/23/2013   Lab Results  Component Value Date   WBC 7.5 11/28/2020   HGB 14.7 11/28/2020   HCT 42.3 11/28/2020   MCV 95.5 11/28/2020   PLT 245 11/28/2020   Lab Results  Component Value Date   NA 137 11/28/2020   K 3.7 11/28/2020   CO2 25 11/28/2020   GLUCOSE 151 (H) 11/28/2020   BUN 19 11/28/2020   CREATININE 0.91 11/28/2020   BILITOT 0.9 11/28/2020   ALKPHOS 48 11/28/2020   AST 19 11/28/2020   ALT 16 11/28/2020   PROT 6.7 11/28/2020   ALBUMIN 3.8 11/28/2020   CALCIUM 9.2 11/28/2020   ANIONGAP 8 11/28/2020   GFR 86.69 10/17/2020   Lab Results  Component Value Date   HGBA1C 6.2 09/09/2014      Assessment & Plan:   Problem List Items Addressed This Visit       Respiratory   Allergic rhinitis due to pollen    Continue singulair       Bronchitis    Take antibiotic as prescribed. Increase oral fluids. Pt to f/u if sx worsen and or fail to improve in 2-3 days.       Relevant Medications   doxycycline (VIBRAMYCIN) 100 MG capsule     Other   Sore throat    Strep test ordered, negative in office. Warm salt water gargles prn      Relevant Orders   POCT rapid strep A   Other Visit Diagnoses     Allergic rhinitis, unspecified seasonality, unspecified trigger    -  Primary   Relevant Medications   montelukast (SINGULAIR) 10 MG tablet       Meds ordered this encounter  Medications   montelukast (SINGULAIR) 10 MG tablet    Sig: Take 1 tablet (10 mg total) by mouth as needed. Allergies    Dispense:  30 tablet    Refill:  1    Order Specific  Question:   Supervising Provider    Answer:   BEDSOLE, AMY E [[id number]]   doxycycline (VIBRAMYCIN) 100 MG capsule    Sig: Take 1 capsule (100 mg total) by mouth 2 (two) times daily for 7 days.    Dispense:  14 capsule    Refill:  0    Order Specific Question:   Supervising Provider    Answer:   Diona Browner, AMY E [[id number]]    Follow-up: Return in about 2 days (around 03/13/2021) for only if no improvement in symptoms otherwise not needed.    Eugenia Pancoast, FNP

## 2021-03-11 NOTE — Assessment & Plan Note (Signed)
Take antibiotic as prescribed. Increase oral fluids. Pt to f/u if sx worsen and or fail to improve in 2-3 days.  

## 2021-03-11 NOTE — Patient Instructions (Signed)
Antibiotic sent to preferred pharmacy.   Please increase oral fluids, steamy hot shower/humidifier prn.  Please follow up if no improvement in 2-3 days.   It was a pleasure seeing you today! Please do not hesitate to reach out with any questions and or concerns.  Regards,   Layloni Fahrner   

## 2021-03-11 NOTE — Assessment & Plan Note (Signed)
Strep test ordered, negative in office. Warm salt water gargles prn

## 2021-03-13 ENCOUNTER — Other Ambulatory Visit: Payer: Self-pay

## 2021-03-13 ENCOUNTER — Ambulatory Visit (INDEPENDENT_AMBULATORY_CARE_PROVIDER_SITE_OTHER): Payer: Medicare Other | Admitting: Nurse Practitioner

## 2021-03-13 ENCOUNTER — Ambulatory Visit (INDEPENDENT_AMBULATORY_CARE_PROVIDER_SITE_OTHER): Payer: Medicare Other

## 2021-03-13 VITALS — BP 158/78 | HR 77 | Temp 97.9°F | Resp 14 | Ht 68.0 in | Wt 186.4 lb

## 2021-03-13 DIAGNOSIS — R062 Wheezing: Secondary | ICD-10-CM

## 2021-03-13 DIAGNOSIS — R0689 Other abnormalities of breathing: Secondary | ICD-10-CM | POA: Insufficient documentation

## 2021-03-13 DIAGNOSIS — R0989 Other specified symptoms and signs involving the circulatory and respiratory systems: Secondary | ICD-10-CM | POA: Diagnosis not present

## 2021-03-13 MED ORDER — PREDNISONE 20 MG PO TABS: ORAL_TABLET | ORAL | 0 refills | Status: AC

## 2021-03-13 NOTE — Progress Notes (Signed)
Acute Office Visit  Subjective:    Patient ID: Rodney Gray, male    DOB: 05/21/1950, 70 y.o.   MRN: [id number]  Chief Complaint  Patient presents with   Cough    Keeps having cough spells, and hears rattling sensation in the chest with laying down. Patient did have OV with Tabitha Dugal on 03/11/21 for these symptoms but he did not have rattling in the chest at that time. Had fever of 100.8 on 03/11/21 late in the evening. (Originally had sx since 03/06/21)     Patient is in today for Cough  States that he was seen on Wednesday by a colleague of mine and was prescribed an antibiotic (doxycycline). He states that he tried to take it without food lik e the bottle states but it cause too great of a stomach upset.  States that last night when he laid down he heard a rattling in his chest. States later that night he developed a fever on 100.8  Has been using doxy, singular, mucinex, and a delsyum cough syrup. 7mg  prednisone every other day for his PMR  Past Medical History:  Diagnosis Date   Allergic rhinitis due to pollen    Atrial fibrillation ([place])    A-Fib, 1987, 2003, 2005, EPS RX 2005 (A-Fib reocurred)   Basal cell carcinoma    skin CA- squamous and Basal cell   BPH (benign prostatic hypertrophy)    Cataract    Hyperlipemia    no per pt   Hypertension    no per pt   Osteoarthritis    Polymyalgia rheumatica ([place])    Squamous cell carcinoma in situ     Past Surgical History:  Procedure Laterality Date   CAPSULOTOMY Bilateral 08/2007   CATARACT EXTRACTION Bilateral    both eyes   COLONOSCOPY     MENISCUS REPAIR Left 09/2009   left knee---[facility]   NASAL SINUS SURGERY  May 2014   RETINAL TEAR REPAIR CRYOTHERAPY Right    right 12/06   RETINAL TEAR REPAIR CRYOTHERAPY Right    Retinal tear/bleed- Right 12/06   SQUAMOUS CELL CARCINOMA EXCISION Left 07/2008   left forearm   THUMB ARTHROSCOPY Left 1999    Family History  Problem Relation Age of Onset    Hypertension Father    Cirrhosis Father 63   Alcohol abuse Father    Gallstones Mother    Hyperlipidemia Mother    Esophageal cancer Brother    Asthma Daughter    Coronary artery disease Neg Hx    Diabetes Neg Hx    Cancer Neg Hx        prostate or colon   Colon cancer Neg Hx    Rectal cancer Neg Hx    Stomach cancer Neg Hx     Social History   Socioeconomic History   Marital status: Married    Spouse name: Not on file   Number of children: 3   Years of education: Not on file   Highest education level: Not on file  Occupational History   Occupation: retired Primary school teacher: RETIRED  Tobacco Use   Smoking status: Never   Smokeless tobacco: Never  Vaping Use   Vaping Use: Never used  Substance and Sexual Activity   Alcohol use: Yes    Comment: occ. 3 per month   Drug use: Never   Sexual activity: Not on file  Other Topics Concern   Not on file  Social History  Narrative   Has living will   Wife is health care POA--alternate is son [name]   Would accept resuscitation attempts   Would accept tube feeds for some time--depending on prognosis   Social Determinants of Health   Financial Resource Strain: Not on file  Food Insecurity: Not on file  Transportation Needs: Not on file  Physical Activity: Not on file  Stress: Not on file  Social Connections: Not on file  Intimate Partner Violence: Not on file    Outpatient Medications Prior to Visit  Medication Sig Dispense Refill   aspirin EC 81 MG tablet Take 1 tablet (81 mg total) by mouth daily. 90 tablet 3   clindamycin (CLEOCIN) 150 MG capsule Take 4 capsules by mouth as needed. Prior to dental appointments     diltiazem (CARDIZEM CD) 180 MG 24 hr capsule TAKE 1 CAPSULE BY MOUTH EVERY DAY 90 capsule 2   doxycycline (VIBRAMYCIN) 100 MG capsule Take 1 capsule (100 mg total) by mouth 2 (two) times daily for 7 days. 14 capsule 0   hydrocortisone (ANUSOL-HC) 2.5 % rectal cream Place 1 application  rectally 2 (two) times daily. 30 g 0   montelukast (SINGULAIR) 10 MG tablet Take 1 tablet (10 mg total) by mouth as needed. Allergies 30 tablet 1   Multiple Vitamins-Minerals (MENS MULTIVITAMIN PLUS PO) Take 1 capsule by mouth daily.     Omega-3 Fatty Acids (FISH OIL) 1200 MG CAPS Take 1 capsule by mouth daily.     ondansetron (ZOFRAN ODT) 4 MG disintegrating tablet Take 1 tablet (4 mg total) by mouth every 8 (eight) hours as needed for nausea or vomiting. 20 tablet 0   predniSONE (DELTASONE) 1 MG tablet Take 4 tablets (4 mg total) by mouth daily with breakfast. Wean as directed 360 tablet 3   predniSONE (DELTASONE) 5 MG tablet TAKE 2 TABLETS (10 MG TOTAL) BY MOUTH EVERY OTHER DAY. WEAN AS DIRECTED 100 tablet 3   Probiotic Product (DIGESTIVE ADVANTAGE GUMMIES PO) Take 1-2 tablets by mouth daily.     No facility-administered medications prior to visit.    Allergies  Allergen Reactions   Ampicillin     REACTION: hives Has tolerated cephalosporins    Review of Systems  Constitutional:  Positive for fatigue.  Respiratory:  Positive for cough (rare production. yellow colord), chest tightness and shortness of breath (after coughing).   Cardiovascular:  Negative for chest pain.  Gastrointestinal:  Negative for diarrhea, nausea and vomiting.  Musculoskeletal:  Negative for arthralgias and myalgias.  Neurological:  Positive for headaches.      Objective:    Physical Exam Vitals and nursing note reviewed.  Constitutional:      Appearance: Normal appearance.  HENT:     Right Ear: Tympanic membrane, ear canal and external ear normal. There is no impacted cerumen.     Left Ear: Tympanic membrane, ear canal and external ear normal. There is no impacted cerumen.     Nose:     Right Sinus: No maxillary sinus tenderness or frontal sinus tenderness.     Left Sinus: No maxillary sinus tenderness or frontal sinus tenderness.     Mouth/Throat:     Mouth: Mucous membranes are moist.     Pharynx:  Oropharynx is clear.  Cardiovascular:     Rate and Rhythm: Normal rate and regular rhythm.  Pulmonary:     Effort: Pulmonary effort is normal.     Breath sounds: Examination of the right-upper field reveals wheezing and rhonchi. Examination  of the left-upper field reveals wheezing and rhonchi. Examination of the left-lower field reveals wheezing and rhonchi. Decreased breath sounds, wheezing and rhonchi present.  Abdominal:     General: Bowel sounds are normal.  Lymphadenopathy:     Cervical: No cervical adenopathy.  Neurological:     Mental Status: He is alert.  Psychiatric:        Mood and Affect: Mood normal.        Behavior: Behavior normal.        Thought Content: Thought content normal.        Judgment: Judgment normal.    BP (!) 158/78   Pulse 77   Temp 97.9 F (36.6 C)   Resp 14   Ht 5\' 8"  (1.727 m)   Wt 186 lb 6 oz (84.5 kg)   SpO2 98%   BMI 28.34 kg/m  Wt Readings from Last 3 Encounters:  03/13/21 186 lb 6 oz (84.5 kg)  03/11/21 186 lb (84.4 kg)  12/09/20 187 lb (84.8 kg)    Health Maintenance Due  Topic Date Due   Hepatitis C Screening  Never done   Zoster Vaccines- Shingrix (1 of 2) Never done    There are no preventive care reminders to display for this patient.   Lab Results  Component Value Date   TSH 1.51 08/23/2013   Lab Results  Component Value Date   WBC 7.5 11/28/2020   HGB 14.7 11/28/2020   HCT 42.3 11/28/2020   MCV 95.5 11/28/2020   PLT 245 11/28/2020   Lab Results  Component Value Date   NA 137 11/28/2020   K 3.7 11/28/2020   CO2 25 11/28/2020   GLUCOSE 151 (H) 11/28/2020   BUN 19 11/28/2020   CREATININE 0.91 11/28/2020   BILITOT 0.9 11/28/2020   ALKPHOS 48 11/28/2020   AST 19 11/28/2020   ALT 16 11/28/2020   PROT 6.7 11/28/2020   ALBUMIN 3.8 11/28/2020   CALCIUM 9.2 11/28/2020   ANIONGAP 8 11/28/2020   GFR 86.69 10/17/2020   Lab Results  Component Value Date   CHOL 202 (H) 10/11/2018   Lab Results  Component Value  Date   HDL 57.30 10/11/2018   Lab Results  Component Value Date   LDLCALC 122 (H) 10/11/2018   Lab Results  Component Value Date   TRIG 112.0 10/11/2018   Lab Results  Component Value Date   CHOLHDL 4 10/11/2018   Lab Results  Component Value Date   HGBA1C 6.2 09/09/2014       Assessment & Plan:   Problem List Items Addressed This Visit       Other   Wheezing - Primary    Patient was experiencing wheezing in office today.  He is currently on a low-dose prednisone every other day for his PMR.  Did discuss we will bump up his prednisone for the next 6 days with a taper and then he will continue his normal prednisone as prescribed by his primary care provider.  Patient knowledge understood.  Did discuss signs and symptoms when symptoms to be seen urgent or emergently with patient      Relevant Medications   predniSONE (DELTASONE) 20 MG tablet   Other Relevant Orders   DG Chest 2 View (Completed)   Adventitious breath sounds    Wheezing and rhonchi scattered throughout bilateral lung fields.  Obtain chest x-ray.  Chest x-ray results were reviewed prior to closing chart no acute abnormality.  Continue taking doxycycline as prescribed from previous  clinician start prednisone prescribed by me.      Relevant Orders   DG Chest 2 View (Completed)     No orders of the defined types were placed in this encounter.  This visit occurred during the SARS-CoV-2 public health emergency.  Safety protocols were in place, including screening questions prior to the visit, additional usage of staff PPE, and extensive cleaning of exam room while observing appropriate contact time as indicated for disinfecting solutions.   Romilda Garret, NP

## 2021-03-13 NOTE — Assessment & Plan Note (Signed)
Patient was experiencing wheezing in office today.  He is currently on a low-dose prednisone every other day for his PMR.  Did discuss we will bump up his prednisone for the next 6 days with a taper and then he will continue his normal prednisone as prescribed by his primary care provider.  Patient knowledge understood.  Did discuss signs and symptoms when symptoms to be seen urgent or emergently with patient

## 2021-03-13 NOTE — Assessment & Plan Note (Signed)
Wheezing and rhonchi scattered throughout bilateral lung fields.  Obtain chest x-ray.  Chest x-ray results were reviewed prior to closing chart no acute abnormality.  Continue taking doxycycline as prescribed from previous clinician start prednisone prescribed by me.

## 2021-03-13 NOTE — Patient Instructions (Signed)
Take the prednisone that I sent in and not your normal ones Once you have finished my script go back to the way that you and Dr. Silvio Pate have worked out. Follow up if symptoms do not improve

## 2021-03-25 DIAGNOSIS — M5136 Other intervertebral disc degeneration, lumbar region: Secondary | ICD-10-CM | POA: Diagnosis not present

## 2021-03-25 DIAGNOSIS — M9902 Segmental and somatic dysfunction of thoracic region: Secondary | ICD-10-CM | POA: Diagnosis not present

## 2021-03-25 DIAGNOSIS — M9903 Segmental and somatic dysfunction of lumbar region: Secondary | ICD-10-CM | POA: Diagnosis not present

## 2021-03-25 DIAGNOSIS — M62830 Muscle spasm of back: Secondary | ICD-10-CM | POA: Diagnosis not present

## 2021-04-02 ENCOUNTER — Other Ambulatory Visit: Payer: Self-pay | Admitting: Family

## 2021-04-02 DIAGNOSIS — J309 Allergic rhinitis, unspecified: Secondary | ICD-10-CM

## 2021-04-09 DIAGNOSIS — L570 Actinic keratosis: Secondary | ICD-10-CM | POA: Diagnosis not present

## 2021-04-09 DIAGNOSIS — L578 Other skin changes due to chronic exposure to nonionizing radiation: Secondary | ICD-10-CM | POA: Diagnosis not present

## 2021-04-09 DIAGNOSIS — Z85828 Personal history of other malignant neoplasm of skin: Secondary | ICD-10-CM | POA: Diagnosis not present

## 2021-04-09 DIAGNOSIS — Z872 Personal history of diseases of the skin and subcutaneous tissue: Secondary | ICD-10-CM | POA: Diagnosis not present

## 2021-04-09 DIAGNOSIS — Z859 Personal history of malignant neoplasm, unspecified: Secondary | ICD-10-CM | POA: Diagnosis not present

## 2021-04-22 DIAGNOSIS — M9903 Segmental and somatic dysfunction of lumbar region: Secondary | ICD-10-CM | POA: Diagnosis not present

## 2021-04-22 DIAGNOSIS — M9902 Segmental and somatic dysfunction of thoracic region: Secondary | ICD-10-CM | POA: Diagnosis not present

## 2021-04-22 DIAGNOSIS — M62830 Muscle spasm of back: Secondary | ICD-10-CM | POA: Diagnosis not present

## 2021-04-22 DIAGNOSIS — M5136 Other intervertebral disc degeneration, lumbar region: Secondary | ICD-10-CM | POA: Diagnosis not present

## 2021-04-27 ENCOUNTER — Ambulatory Visit (INDEPENDENT_AMBULATORY_CARE_PROVIDER_SITE_OTHER): Payer: Medicare Other | Admitting: Orthopaedic Surgery

## 2021-04-27 ENCOUNTER — Other Ambulatory Visit: Payer: Self-pay

## 2021-04-27 ENCOUNTER — Ambulatory Visit (INDEPENDENT_AMBULATORY_CARE_PROVIDER_SITE_OTHER): Payer: Medicare Other

## 2021-04-27 DIAGNOSIS — G8929 Other chronic pain: Secondary | ICD-10-CM | POA: Diagnosis not present

## 2021-04-27 DIAGNOSIS — M25511 Pain in right shoulder: Secondary | ICD-10-CM

## 2021-04-27 NOTE — Progress Notes (Signed)
Office Visit Note   Patient: Rodney Gray           Date of Birth: [birth year]           MRN: [id number] Visit Date: 04/27/2021              Requested by: Venia Carbon, MD [place],  [place] [id number] PCP: Venia Carbon, MD   Assessment & Plan: Visit Diagnoses:  1. Chronic right shoulder pain     Plan: I would like him to stop his shoulder exercise routine for the next 4 to 6 weeks.  He can use biceps curls but other than that I want him to just concentrate on cardiovascular exercises.  If his shoulder does not get better I would like to see him back at that point we will try a steroid injection in the subacromial outlet and at some point consider a MRI.  All question concerns were answered and addressed.  Follow-Up Instructions: Return if symptoms worsen or fail to improve.   Orders:  Orders Placed This Encounter  Procedures   XR Shoulder Right   No orders of the defined types were placed in this encounter.     Procedures: No procedures performed   Clinical Data: No additional findings.   Subjective: Chief Complaint  Patient presents with   Right Shoulder - Pain  The patient comes in today for evaluation treatment of right shoulder pain.  Have seen him for the shoulder before but has been remote and he had a steroid injection at one point with the shoulder.  He is 71 years old and very active.  He does do a regular workout routine and showed me all the different exercises he does.  I believe that that has contributed to his shoulder discomfort.  He says is actually feeling much better today and his motion is improved.  He does have polymyalgia rheumatica and takes prednisone every other day.  He was on a large dose of prednisone in December for respiratory issue.  He is not a diabetic and has had no acute change in his medical status.  HPI  Review of Systems There is currently listed no headache, chest pain, shortness of breath,  fever, chills, nausea, vomiting  Objective: Vital Signs: There were no vitals taken for this visit.  Physical Exam He is alert and orient x3 and in no acute distress Ortho Exam Examination of his right shoulder shows that he has full range of motion of the shoulder.  He does use his deltoid just slightly with abduction.  He has good strength though in the rotator cuff and negative lift off.  He does have signs of shoulder impingement and positive Neer and Hawkins signs. Specialty Comments:  No specialty comments available.  Imaging: XR Shoulder Right  Result Date: 04/27/2021 3 views of the right shoulder show no acute findings.  There is slight decrease in the subacromial outlet and osteophytes at the [facility] joint.  Glenohumeral joint is well located with joint space.    PMFS History: Patient Active Problem List   Diagnosis Date Noted   Wheezing 03/13/2021   Adventitious breath sounds 03/13/2021   Sore throat 03/11/2021   Bronchitis 03/11/2021   Hemorrhoid prolapse 12/25/2019   Polymyalgia rheumatica ([place]) 08/01/2017   Advance directive discussed with patient 09/12/2015   BPH with obstruction/lower urinary tract symptoms    Allergic rhinitis due to pollen    Atrial fibrillation ([place]) 05/12/2009  OSTEOARTHRITIS 09/13/2006   Past Medical History:  Diagnosis Date   Allergic rhinitis due to pollen    Atrial fibrillation ([place])    A-Fib, 1987, 2003, 2005, EPS RX 2005 (A-Fib reocurred)   Basal cell carcinoma    skin CA- squamous and Basal cell   BPH (benign prostatic hypertrophy)    Cataract    Hyperlipemia    no per pt   Hypertension    no per pt   Osteoarthritis    Polymyalgia rheumatica ([place])    Squamous cell carcinoma in situ     Family History  Problem Relation Age of Onset   Hypertension Father    Cirrhosis Father 4   Alcohol abuse Father    Gallstones Mother    Hyperlipidemia Mother    Esophageal cancer Brother    Asthma Daughter    Coronary artery disease Neg  Hx    Diabetes Neg Hx    Cancer Neg Hx        prostate or colon   Colon cancer Neg Hx    Rectal cancer Neg Hx    Stomach cancer Neg Hx     Past Surgical History:  Procedure Laterality Date   CAPSULOTOMY Bilateral 08/2007   CATARACT EXTRACTION Bilateral    both eyes   COLONOSCOPY     MENISCUS REPAIR Left 09/2009   left knee---Dr [facility]   NASAL SINUS SURGERY  May 2014   RETINAL TEAR REPAIR CRYOTHERAPY Right    right 12/06   RETINAL TEAR REPAIR CRYOTHERAPY Right    Retinal tear/bleed- Right 12/06   SQUAMOUS CELL CARCINOMA EXCISION Left 07/2008   left forearm   THUMB ARTHROSCOPY Left 1999   Social History   Occupational History   Occupation: retired Primary school teacher: RETIRED  Tobacco Use   Smoking status: Never   Smokeless tobacco: Never  Vaping Use   Vaping Use: Never used  Substance and Sexual Activity   Alcohol use: Yes    Comment: occ. 3 per month   Drug use: Never   Sexual activity: Not on file

## 2021-05-04 ENCOUNTER — Other Ambulatory Visit: Payer: Self-pay | Admitting: Internal Medicine

## 2021-05-26 DIAGNOSIS — M9903 Segmental and somatic dysfunction of lumbar region: Secondary | ICD-10-CM | POA: Diagnosis not present

## 2021-05-26 DIAGNOSIS — M9902 Segmental and somatic dysfunction of thoracic region: Secondary | ICD-10-CM | POA: Diagnosis not present

## 2021-05-26 DIAGNOSIS — M5136 Other intervertebral disc degeneration, lumbar region: Secondary | ICD-10-CM | POA: Diagnosis not present

## 2021-05-26 DIAGNOSIS — M62830 Muscle spasm of back: Secondary | ICD-10-CM | POA: Diagnosis not present

## 2021-06-01 ENCOUNTER — Ambulatory Visit (INDEPENDENT_AMBULATORY_CARE_PROVIDER_SITE_OTHER): Payer: Medicare Other | Admitting: Orthopaedic Surgery

## 2021-06-01 ENCOUNTER — Encounter: Payer: Self-pay | Admitting: Orthopaedic Surgery

## 2021-06-01 DIAGNOSIS — G8929 Other chronic pain: Secondary | ICD-10-CM | POA: Diagnosis not present

## 2021-06-01 DIAGNOSIS — M25511 Pain in right shoulder: Secondary | ICD-10-CM | POA: Diagnosis not present

## 2021-06-01 MED ORDER — METHYLPREDNISOLONE ACETATE 40 MG/ML IJ SUSP
40.0000 mg | INTRAMUSCULAR | Status: AC | PRN
  Administered 2021-06-01: 40 mg via INTRA_ARTICULAR

## 2021-06-01 MED ORDER — LIDOCAINE HCL 1 % IJ SOLN
3.0000 mL | INTRAMUSCULAR | Status: AC | PRN
  Administered 2021-06-01: 3 mL

## 2021-06-01 NOTE — Progress Notes (Signed)
Office Visit Note   Patient: Rodney Gray           Date of Birth: 1951/03/31           MRN: [id number] Visit Date: 06/01/2021              Requested by: Venia Carbon, MD [place],  [place] [id number] PCP: Venia Carbon, MD   Assessment & Plan: Visit Diagnoses:  1. Chronic right shoulder pain     Plan: He will continue activity modification.  I did place a steroid injection in his right shoulder subacromial space which he tolerated well.  I am going to have him use a Thera-Band for his right Achilles.  Follow-up can be as needed.  If his right shoulder does not get better he knows to call us because my next step would be to extend her a MRI of the shoulder to rule out any type of tear of the rotator cuff.  Follow-Up Instructions: Return in about 6 weeks (around 07/13/2021), or if symptoms worsen or fail to improve.   Orders:  Orders Placed This Encounter  Procedures   Large Joint Inj   No orders of the defined types were placed in this encounter.     Procedures: Large Joint Inj: R subacromial bursa on 06/01/2021 2:01 PM Indications: pain and diagnostic evaluation Details: 22 G 1.5 in needle  Arthrogram: No  Medications: 3 mL lidocaine 1 %; 40 mg methylPREDNISolone acetate 40 MG/ML Outcome: tolerated well, no immediate complications Procedure, treatment alternatives, risks and benefits explained, specific risks discussed. Consent was given by the patient. Immediately prior to procedure a time out was called to verify the correct patient, procedure, equipment, support staff and site/side marked as required. Patient was prepped and draped in the usual sterile fashion.      Clinical Data: No additional findings.   Subjective: Chief Complaint  Patient presents with   Right Shoulder - Follow-up  The patient is an incredibly active 71 year old gentleman who comes in for follow-up as it relates to his right shoulder.  Have seen him for the  shoulder before back in January and he deferred any type of injection.  He does state that he would like to have a steroid injection today to see what this does for him.  He will be traveling to [place] later in the next month and will be swimming and snorkeling.  He does have a cruise later this year as well.  He has also been dealing with some right Achilles pain.  He has done a lot of yard work recently as well and does walk a lot.  This is flared up his shoulder in terms of the yard work.  He does sleep on his right side with his arm above his head which I think aggravates his shoulder as well.  HPI  Review of Systems There is currently listed no fever, chills, nausea, vomiting  Objective: Vital Signs: There were no vitals taken for this visit.  Physical Exam He is alert and oriented x3 and in no acute distress Ortho Exam Examination of his right shoulder shows positive Neer and Hawkins signs with good range of motion overall and only slight weakness in the cuff.  Examination of his light Achilles shows some pain over the Achilles tendon near the insertion but his [place] Silos test is negative.  There is swelling in that area. Specialty Comments:  No specialty comments available.  Imaging: No results found.  PMFS History: Patient Active Problem List   Diagnosis Date Noted   Wheezing 03/13/2021   Adventitious breath sounds 03/13/2021   Sore throat 03/11/2021   Bronchitis 03/11/2021   Hemorrhoid prolapse 12/25/2019   Polymyalgia rheumatica ([place]) 08/01/2017   Advance directive discussed with patient 09/12/2015   BPH with obstruction/lower urinary tract symptoms    Allergic rhinitis due to pollen    Atrial fibrillation ([place]) 05/12/2009   OSTEOARTHRITIS 09/13/2006   Past Medical History:  Diagnosis Date   Allergic rhinitis due to pollen    Atrial fibrillation ([place])    A-Fib, 1987, 2003, 2005, EPS RX 2005 (A-Fib reocurred)   Basal cell carcinoma    skin CA- squamous and Basal  cell   BPH (benign prostatic hypertrophy)    Cataract    Hyperlipemia    no per pt   Hypertension    no per pt   Osteoarthritis    Polymyalgia rheumatica ([place])    Squamous cell carcinoma in situ     Family History  Problem Relation Age of Onset   Hypertension Father    Cirrhosis Father 96   Alcohol abuse Father    Gallstones Mother    Hyperlipidemia Mother    Esophageal cancer Brother    Asthma Daughter    Coronary artery disease Neg Hx    Diabetes Neg Hx    Cancer Neg Hx        prostate or colon   Colon cancer Neg Hx    Rectal cancer Neg Hx    Stomach cancer Neg Hx     Past Surgical History:  Procedure Laterality Date   CAPSULOTOMY Bilateral 08/2007   CATARACT EXTRACTION Bilateral    both eyes   COLONOSCOPY     MENISCUS REPAIR Left 09/2009   left knee---Dr [facility]   NASAL SINUS SURGERY  May 2014   RETINAL TEAR REPAIR CRYOTHERAPY Right    right 12/06   RETINAL TEAR REPAIR CRYOTHERAPY Right    Retinal tear/bleed- Right 12/06   SQUAMOUS CELL CARCINOMA EXCISION Left 07/2008   left forearm   THUMB ARTHROSCOPY Left 1999   Social History   Occupational History   Occupation: retired Primary school teacher: RETIRED  Tobacco Use   Smoking status: Never   Smokeless tobacco: Never  Vaping Use   Vaping Use: Never used  Substance and Sexual Activity   Alcohol use: Yes    Comment: occ. 3 per month   Drug use: Never   Sexual activity: Not on file

## 2021-06-03 DIAGNOSIS — D485 Neoplasm of uncertain behavior of skin: Secondary | ICD-10-CM | POA: Diagnosis not present

## 2021-06-03 DIAGNOSIS — C4442 Squamous cell carcinoma of skin of scalp and neck: Secondary | ICD-10-CM | POA: Diagnosis not present

## 2021-06-03 DIAGNOSIS — L570 Actinic keratosis: Secondary | ICD-10-CM | POA: Diagnosis not present

## 2021-06-23 DIAGNOSIS — M9902 Segmental and somatic dysfunction of thoracic region: Secondary | ICD-10-CM | POA: Diagnosis not present

## 2021-06-23 DIAGNOSIS — M9903 Segmental and somatic dysfunction of lumbar region: Secondary | ICD-10-CM | POA: Diagnosis not present

## 2021-06-23 DIAGNOSIS — M5136 Other intervertebral disc degeneration, lumbar region: Secondary | ICD-10-CM | POA: Diagnosis not present

## 2021-06-23 DIAGNOSIS — M62830 Muscle spasm of back: Secondary | ICD-10-CM | POA: Diagnosis not present

## 2021-06-29 DIAGNOSIS — H04123 Dry eye syndrome of bilateral lacrimal glands: Secondary | ICD-10-CM | POA: Diagnosis not present

## 2021-06-29 DIAGNOSIS — H35372 Puckering of macula, left eye: Secondary | ICD-10-CM | POA: Diagnosis not present

## 2021-06-29 DIAGNOSIS — H33301 Unspecified retinal break, right eye: Secondary | ICD-10-CM | POA: Diagnosis not present

## 2021-06-29 DIAGNOSIS — Z961 Presence of intraocular lens: Secondary | ICD-10-CM | POA: Diagnosis not present

## 2021-06-29 DIAGNOSIS — H40013 Open angle with borderline findings, low risk, bilateral: Secondary | ICD-10-CM | POA: Diagnosis not present

## 2021-07-09 ENCOUNTER — Ambulatory Visit: Payer: Medicare Other | Admitting: Internal Medicine

## 2021-07-21 DIAGNOSIS — M5136 Other intervertebral disc degeneration, lumbar region: Secondary | ICD-10-CM | POA: Diagnosis not present

## 2021-07-21 DIAGNOSIS — M9902 Segmental and somatic dysfunction of thoracic region: Secondary | ICD-10-CM | POA: Diagnosis not present

## 2021-07-21 DIAGNOSIS — M9903 Segmental and somatic dysfunction of lumbar region: Secondary | ICD-10-CM | POA: Diagnosis not present

## 2021-07-21 DIAGNOSIS — M62830 Muscle spasm of back: Secondary | ICD-10-CM | POA: Diagnosis not present

## 2021-07-22 ENCOUNTER — Other Ambulatory Visit: Payer: Self-pay | Admitting: Internal Medicine

## 2021-07-26 ENCOUNTER — Encounter: Payer: Self-pay | Admitting: Internal Medicine

## 2021-08-25 DIAGNOSIS — M5136 Other intervertebral disc degeneration, lumbar region: Secondary | ICD-10-CM | POA: Diagnosis not present

## 2021-08-25 DIAGNOSIS — M9902 Segmental and somatic dysfunction of thoracic region: Secondary | ICD-10-CM | POA: Diagnosis not present

## 2021-08-25 DIAGNOSIS — M9903 Segmental and somatic dysfunction of lumbar region: Secondary | ICD-10-CM | POA: Diagnosis not present

## 2021-08-25 DIAGNOSIS — M62830 Muscle spasm of back: Secondary | ICD-10-CM | POA: Diagnosis not present

## 2021-08-26 ENCOUNTER — Ambulatory Visit (INDEPENDENT_AMBULATORY_CARE_PROVIDER_SITE_OTHER): Payer: Medicare Other | Admitting: Physician Assistant

## 2021-08-26 ENCOUNTER — Encounter: Payer: Self-pay | Admitting: Physician Assistant

## 2021-08-26 ENCOUNTER — Other Ambulatory Visit: Payer: Self-pay | Admitting: Internal Medicine

## 2021-08-26 DIAGNOSIS — M7661 Achilles tendinitis, right leg: Secondary | ICD-10-CM

## 2021-08-26 NOTE — Progress Notes (Signed)
HPI: Mr. Dimalanta returns today with complaint of right Achilles pain.  He was seen in February mention this to Dr. Ninfa Linden.  He did advise should pursue stretching exercises.  He reports no injury to the Achilles.  He has tried RICE treatment.  Does wear some open back shoes occasionally but states is rare and for short duration.  He is on chronic prednisone takes 7 mg every other day due to his Pol[place] myalgia rheumatica.  Review of systems: See HPI otherwise negative  Physical exam: Bilateral feet sensation grossly intact dorsal pedal pulse 2+.  Thompson's test is negative bilaterally.  Mid Achilles tendinopathy on the right.  There is no abnormal warmth erythema.  No tenderness at the insertion site of the Achilles on the right.  No palpable deficit throughout the right Achilles.  Impression right Achilles tendinitis  Plan: We will place in a cam walker boot with 916 7 we will lift he will wear this anytime whenever he is up ambulating does not need to wear the bed and he understands not to drive while wearing the boot.  He will wear the boot for 2 weeks and then transition to 916 his heel lifts in both shoes.  He is given a prescription for formal physical therapy to work on stretching modalities home exercise program to the right Achilles.  He will also begin applying Voltaren gel 4 g 4 times daily to the Achilles on the right.  We will see him back in approximately 5 weeks due to the fact to he will be on vacation for at least a week between now and his next visit.  Questions were encouraged and answered shoewear was discussed.

## 2021-09-01 ENCOUNTER — Other Ambulatory Visit
Admission: RE | Admit: 2021-09-01 | Discharge: 2021-09-01 | Disposition: A | Discharge status: Home / Self Care | Payer: Medicare Other | Attending: Urology | Admitting: Urology

## 2021-09-01 ENCOUNTER — Encounter: Payer: Self-pay | Admitting: Urology

## 2021-09-01 ENCOUNTER — Ambulatory Visit (INDEPENDENT_AMBULATORY_CARE_PROVIDER_SITE_OTHER): Payer: Medicare Other | Admitting: Urology

## 2021-09-01 ENCOUNTER — Ambulatory Visit: Payer: Medicare Other | Admitting: Urology

## 2021-09-01 VITALS — BP 149/80 | HR 75 | Ht 68.0 in | Wt 185.0 lb

## 2021-09-01 DIAGNOSIS — N529 Male erectile dysfunction, unspecified: Secondary | ICD-10-CM

## 2021-09-01 DIAGNOSIS — N200 Calculus of kidney: Secondary | ICD-10-CM

## 2021-09-01 DIAGNOSIS — R6882 Decreased libido: Secondary | ICD-10-CM

## 2021-09-01 DIAGNOSIS — N528 Other male erectile dysfunction: Secondary | ICD-10-CM

## 2021-09-01 DIAGNOSIS — L570 Actinic keratosis: Secondary | ICD-10-CM | POA: Diagnosis not present

## 2021-09-01 DIAGNOSIS — Z87442 Personal history of urinary calculi: Secondary | ICD-10-CM | POA: Diagnosis not present

## 2021-09-01 MED ORDER — TADALAFIL 5 MG PO TABS: 5.0000 mg | ORAL_TABLET | Freq: Every day | ORAL | 11 refills | Status: DC

## 2021-09-01 NOTE — Patient Instructions (Signed)
Erectile Dysfunction ?Erectile dysfunction (ED) is the inability to get or keep an erection in order to have sexual intercourse. ED is considered a symptom of an underlying disorder and is not considered a disease. ED may include: ?Inability to get an erection. ?Lack of enough hardness of the erection to allow penetration. ?Loss of erection before sex is finished. ?What are the causes? ?This condition may be caused by: ?Physical causes, such as: ?Artery problems. This may include heart disease, high blood pressure, atherosclerosis, and diabetes. ?Hormonal problems, such as low testosterone. ?Obesity. ?Nerve problems. This may include back or pelvic injuries, multiple sclerosis, Parkinson's disease, spinal cord injury, and stroke. ?Certain medicines, such as: ?Pain relievers. ?Antidepressants. ?Blood pressure medicines and water pills (diuretics). ?Cancer medicines. ?Antihistamines. ?Muscle relaxants. ?Lifestyle factors, such as: ?Use of drugs such as marijuana, cocaine, or opioids. ?Excessive use of alcohol. ?Smoking. ?Lack of physical activity or exercise. ?Psychological causes, such as: ?Anxiety or stress. ?Sadness or depression. ?Exhaustion. ?Fear about sexual performance. ?Guilt. ?What are the signs or symptoms? ?Symptoms of this condition include: ?Inability to get an erection. ?Lack of enough hardness of the erection to allow penetration. ?Loss of the erection before sex is finished. ?Sometimes having normal erections, but with frequent unsatisfactory episodes. ?Low sexual satisfaction in either partner due to erection problems. ?A curved penis occurring with erection. The curve may cause pain, or the penis may be too curved to allow for intercourse. ?Never having nighttime or morning erections. ?How is this diagnosed? ?This condition is often diagnosed by: ?Performing a physical exam to find other diseases or specific problems with the penis. ?Asking you detailed questions about the problem. ?Doing tests,  such as: ?Blood tests to check for diabetes mellitus or high cholesterol, or to measure hormone levels. ?Other tests to check for underlying health conditions. ?An ultrasound exam to check for scarring. ?A test to check blood flow to the penis. ?Doing a sleep study at home to measure nighttime erections. ?How is this treated? ?This condition may be treated by: ?Medicines, such as: ?Medicine taken by mouth to help you achieve an erection (oral medicine). ?Hormone replacement therapy to replace low testosterone levels. ?Medicine that is injected into the penis. Your health care provider may instruct you how to give yourself these injections at home. ?Medicine that is delivered with a short applicator tube. The tube is inserted into the opening at the tip of the penis, which is the opening of the urethra. A tiny pellet of medicine is put in the urethra. The pellet dissolves and enhances erectile function. This is also called MUSE (medicated urethral system for erections) therapy. ?Vacuum pump. This is a pump with a ring on it. The pump and ring are placed on the penis and used to create pressure that helps the penis become erect. ?Penile implant surgery. In this procedure, you may receive: ?An inflatable implant. This consists of cylinders, a pump, and a reservoir. The cylinders can be inflated with a fluid that helps to create an erection, and they can be deflated after intercourse. ?A semi-rigid implant. This consists of two silicone rubber rods. The rods provide some rigidity. They are also flexible, so the penis can both curve downward in its normal position and become straight for sexual intercourse. ?Blood vessel surgery to improve blood flow to the penis. During this procedure, a blood vessel from a different part of the body is placed into the penis to allow blood to flow around (bypass) damaged or blocked blood vessels. ?Lifestyle changes,   such as exercising more, losing weight, and quitting smoking. ?Follow  these instructions at home: ?Medicines ? ?Take over-the-counter and prescription medicines only as told by your health care provider. Do not increase the dosage without first discussing it with your health care provider. ?If you are using self-injections, do injections as directed by your health care provider. Make sure you avoid any veins that are on the surface of the penis. After giving an injection, apply pressure to the injection site for 5 minutes. ?Talk to your health care provider about how to prevent headaches while taking ED medicines. These medicines may cause a sudden headache due to the increase in blood flow in your body. ?General instructions ?Exercise regularly, as directed by your health care provider. Work with your health care provider to lose weight, if needed. ?Do not use any products that contain nicotine or tobacco. These products include cigarettes, chewing tobacco, and vaping devices, such as e-cigarettes. If you need help quitting, ask your health care provider. ?Before using a vacuum pump, read the instructions that come with the pump and discuss any questions with your health care provider. ?Keep all follow-up visits. This is important. ?Contact a health care provider if: ?You feel nauseous. ?You are vomiting. ?You get sudden headaches while taking ED medicines. ?You have any concerns about your sexual health. ?Get help right away if: ?You are taking oral or injectable medicines and you have an erection that lasts longer than 4 hours. If your health care provider is unavailable, go to the nearest emergency room for evaluation. An erection that lasts much longer than 4 hours can result in permanent damage to your penis. ?You have severe pain in your groin or abdomen. ?You develop redness or severe swelling of your penis. ?You have redness spreading at your groin or lower abdomen. ?You are unable to urinate. ?You experience chest pain or a rapid heartbeat (palpitations) after taking oral  medicines. ?These symptoms may represent a serious problem that is an emergency. Do not wait to see if the symptoms will go away. Get medical help right away. Call your local emergency services (911 in the [place] Do not drive yourself to the hospital. ?Summary ?Erectile dysfunction (ED) is the inability to get or keep an erection during sexual intercourse. ?This condition is diagnosed based on a physical exam, your symptoms, and tests to determine the cause. Treatment varies depending on the cause and may include medicines, hormone therapy, surgery, or a vacuum pump. ?You may need follow-up visits to make sure that you are using your medicines or devices correctly. ?Get help right away if you are taking or injecting medicines and you have an erection that lasts longer than 4 hours. ?This information is not intended to replace advice given to you by your health care provider. Make sure you discuss any questions you have with your health care provider. ?Document Revised: 06/18/2020 Document Reviewed: 06/18/2020 ?Elsevier Patient Education ? 2023 Elsevier Inc. ? ?

## 2021-09-01 NOTE — Progress Notes (Signed)
   09/01/2021 9:30 AM   Rodney Gray 1950/05/20 [id number]  Reason for visit: ED, concern for possible low testosterone, history of kidney stones  HPI: I saw [name] and his wife today for ED.  He has a history of kidney stones and I saw him in September 2022 after he spontaneously passed a 3 mm left ureteral stone.  He denies any flank pain or episodes of kidney stones since her last visit.  He has some minimally bothersome nocturia 1-2 times per night as well that is chronic.  He reports about a year of problems with ED.  He often has trouble obtaining and maintaining erections.  He has tried some over-the-counter supplements, but no prescription medications previously.  He is on diltiazem, and has an upcoming appointment with his cardiologist to consider additional blood pressure medication.  He also reports some decreased libido, and trouble maintaining muscle mass.  Reviewed the AUA guidelines regarding evaluation and management of patients with erectile dysfunction, as well as recommendation for testosterone evaluation.  We discussed the risk and benefits of PDE 5 inhibitors at length.  -Testosterone today, call with results.  If low will arrange follow-up with PA to discuss replacement options -Trial of Cialis 5 mg daily, okay to titrate up as needed -Has scheduled follow-up in September for stone surveillance  Billey Co, MD  [facility] [address] [place], [place] [address] [phone]

## 2021-09-02 LAB — TESTOSTERONE: Testosterone: 509 ng/dL (ref 264–916)

## 2021-09-16 DIAGNOSIS — M7661 Achilles tendinitis, right leg: Secondary | ICD-10-CM | POA: Diagnosis not present

## 2021-09-21 ENCOUNTER — Ambulatory Visit (INDEPENDENT_AMBULATORY_CARE_PROVIDER_SITE_OTHER): Payer: Medicare Other | Admitting: Internal Medicine

## 2021-09-21 ENCOUNTER — Encounter: Payer: Self-pay | Admitting: Internal Medicine

## 2021-09-21 VITALS — BP 134/62 | HR 64 | Ht 68.0 in | Wt 184.0 lb

## 2021-09-21 DIAGNOSIS — I4819 Other persistent atrial fibrillation: Secondary | ICD-10-CM | POA: Diagnosis not present

## 2021-09-21 DIAGNOSIS — I10 Essential (primary) hypertension: Secondary | ICD-10-CM | POA: Diagnosis not present

## 2021-09-21 MED ORDER — APIXABAN 5 MG PO TABS: 5.0000 mg | ORAL_TABLET | Freq: Two times a day (BID) | ORAL | 11 refills | Status: DC

## 2021-09-21 NOTE — Patient Instructions (Addendum)
Medication Instructions:  Your physician has recommended you make the following change in your medication:    STOP taking your Aspirin.     2.  Start taking your Eliquis '5mg'$  tablet - Take one tablet by mouth two times a day.     Labwork: None ordered.  Testing/Procedures: None ordered.  Follow-Up: Your physician wants you to follow-up in: one year with Cristopher Peru, MD or one of the following Advanced Practice Providers on your designated Care Team:   Tommye Standard, [place] Legrand Como "Jonni Sanger" Chalmers Cater, [place]    Any Other Special Instructions Will Be Listed Below (If Applicable).  If you need a refill on your cardiac medications before your next appointment, please call your pharmacy.   Important Information About Sugar       Apixaban Tablets What is this medication? APIXABAN (a PIX a ban) prevents or treats blood clots. It is also used to lower the risk of stroke in people with AFib (atrial fibrillation). It belongs to a group of medications called blood thinners. This medicine may be used for other purposes; ask your health care provider or pharmacist if you have questions. COMMON BRAND NAME(S): Eliquis What should I tell my care team before I take this medication? They need to know if you have any of these conditions: Antiphospholipid antibody syndrome Bleeding disorder History of bleeding in the brain History of blood clots History of stomach bleeding Kidney disease Liver disease Mechanical heart valve Spinal surgery An unusual or allergic reaction to apixaban, other medications, foods, dyes, or preservatives Pregnant or trying to get pregnant Breast-feeding How should I use this medication? Take this medication by mouth. For your therapy to work as well as possible, take each dose exactly as prescribed on the prescription label. Do not skip doses. Skipping doses or stopping this medication can increase your risk of a blood clot or stroke. Keep taking this medication unless  your care team tells you to stop. Take it as directed on the prescription label at the same time every day. You can take it with or without food. If it upsets your stomach, take it with food. A special MedGuide will be given to you by the pharmacist with each prescription and refill. Be sure to read this information carefully each time. Talk to your care team about the use of this medication in children. Special care may be needed. Overdosage: If you think you have taken too much of this medicine contact a poison control center or emergency room at once. NOTE: This medicine is only for you. Do not share this medicine with others. What if I miss a dose? If you miss a dose, take it as soon as you can. If it is almost time for your next dose, take only that dose. Do not take double or extra doses. What may interact with this medication? This medication may interact with the following: Aspirin and aspirin-like medications Certain medications for fungal infections like itraconazole and ketoconazole Certain medications for seizures like carbamazepine and phenytoin Certain medications for blood clots like enoxaparin, dalteparin, heparin, and warfarin Clarithromycin NSAIDs, medications for pain and inflammation, like ibuprofen or naproxen Rifampin Ritonavir St. John's wort This list may not describe all possible interactions. Give your health care provider a list of all the medicines, herbs, non-prescription drugs, or dietary supplements you use. Also tell them if you smoke, drink alcohol, or use illegal drugs. Some items may interact with your medicine. What should I watch for while using this medication? Visit your  healthcare professional for regular checks on your progress. You may need blood work done while you are taking this medication. Your condition will be monitored carefully while you are receiving this medication. It is important not to miss any appointments. Avoid sports and activities that  might cause injury while you are using this medication. Severe falls or injuries can cause unseen bleeding. Be careful when using sharp tools or knives. Consider using an Copy. Take special care brushing or flossing your teeth. Report any injuries, bruising, or red spots on the skin to your healthcare professional. If you are going to need surgery or other procedure, tell your healthcare professional that you are taking this medication. Wear a medical ID bracelet or chain. Carry a card that describes your disease and details of your medication and dosage times. What side effects may I notice from receiving this medication? Side effects that you should report to your care team as soon as possible: Allergic reactions--skin rash, itching, hives, swelling of the face, lips, tongue, or throat Bleeding--bloody or black, tar-like stools, vomiting blood or brown material that looks like coffee grounds, red or dark brown urine, small red or purple spots on the skin, unusual bruising or bleeding Bleeding in the brain--severe headache, stiff neck, confusion, dizziness, change in vision, numbness or weakness of the face, arm, or leg, trouble speaking, trouble walking, vomiting Heavy periods This list may not describe all possible side effects. Call your doctor for medical advice about side effects. You may report side effects to FDA at 1-800-FDA-1088. Where should I keep my medication? Keep out of the reach of children and pets. Store at room temperature between 20 and 25 degrees C (68 and 77 degrees F). Get rid of any unused medication after the expiration date. To get rid of medications that are no longer needed or expired: Take the medication to a medication take-back program. Check with your pharmacy or law enforcement to find a location. If you cannot return the medication, check the label or package insert to see if the medication should be thrown out in the garbage or flushed down the toilet. If  you are not sure, ask your care team. If it is safe to put in the trash, empty the medication out of the container. Mix the medication with cat litter, dirt, coffee grounds, or other unwanted substance. Seal the mixture in a bag or container. Put it in the trash. NOTE: This sheet is a summary. It may not cover all possible information. If you have questions about this medicine, talk to your doctor, pharmacist, or health care provider.  2023 Elsevier/Gold Standard (2020-04-18 00:00:00)

## 2021-09-21 NOTE — Progress Notes (Signed)
HPI Mr. Chandley returns today for ongoing evaluation and management of chronic atrial fib. He is a pleasant 71 yo man who has had rate controlled atrial fib for over 14 years. He is asymptomatic. He does not have palpitations. He has been active and denies chest pain or sob with exertion. He was diagnosed with PMR over a year ago and has been on low dose prednisone. Recently his bp has gone up. At home he notes that despite cardizem for rate control of atrial fib, his bp has run in the mid 130's. He injured his achilles tendon and has been in a boot.  Allergies  Allergen Reactions   Ampicillin     REACTION: hives Has tolerated cephalosporins     Current Outpatient Medications  Medication Sig Dispense Refill   apixaban (ELIQUIS) 5 MG TABS tablet Take 1 tablet (5 mg total) by mouth 2 (two) times daily. 60 tablet 11   clindamycin (CLEOCIN) 150 MG capsule Take 4 capsules by mouth as needed. Prior to dental appointments     diltiazem (CARDIZEM CD) 180 MG 24 hr capsule TAKE 1 CAPSULE BY MOUTH EVERY DAY 90 capsule 0   hydrocortisone (ANUSOL-HC) 2.5 % rectal cream Place 1 application rectally 2 times daily as needed 30 g 0   montelukast (SINGULAIR) 10 MG tablet TAKE 1 TABLET (10 MG TOTAL) BY MOUTH AS NEEDED. ALLERGIES 90 tablet 1   Multiple Vitamins-Minerals (MENS MULTIVITAMIN PLUS PO) Take 1 capsule by mouth daily.     Omega-3 Fatty Acids (FISH OIL) 1200 MG CAPS Take 1 capsule by mouth daily.     ondansetron (ZOFRAN ODT) 4 MG disintegrating tablet Take 1 tablet (4 mg total) by mouth every 8 (eight) hours as needed for nausea or vomiting. 20 tablet 0   predniSONE (DELTASONE) 1 MG tablet Take 4 tablets (4 mg total) by mouth daily with breakfast. Wean as directed 360 tablet 3   predniSONE (DELTASONE) 5 MG tablet TAKE 2 TABLETS (10 MG TOTAL) BY MOUTH EVERY OTHER DAY. WEAN AS DIRECTED 100 tablet 1   Probiotic Product (DIGESTIVE ADVANTAGE GUMMIES PO) Take 1-2 tablets by mouth daily.      tadalafil (CIALIS) 5 MG tablet Take 1 tablet (5 mg total) by mouth daily. 30 tablet 11   No current facility-administered medications for this visit.     Past Medical History:  Diagnosis Date   Allergic rhinitis due to pollen    Atrial fibrillation ([place])    A-Fib, 1987, 2003, 2005, EPS RX 2005 (A-Fib reocurred)   Basal cell carcinoma    skin CA- squamous and Basal cell   BPH (benign prostatic hypertrophy)    Cataract    Hyperlipemia    no per pt   Hypertension    no per pt   Osteoarthritis    Polymyalgia rheumatica ([place])    Squamous cell carcinoma in situ     ROS:   All systems reviewed and negative except as noted in the HPI.   Past Surgical History:  Procedure Laterality Date   CAPSULOTOMY Bilateral 08/2007   CATARACT EXTRACTION Bilateral    both eyes   COLONOSCOPY     MENISCUS REPAIR Left 09/2009   left knee---Dr [facility]   NASAL SINUS SURGERY  May 2014   RETINAL TEAR REPAIR CRYOTHERAPY Right    right 12/06   RETINAL TEAR REPAIR CRYOTHERAPY Right    Retinal tear/bleed- Right 12/06   SQUAMOUS CELL CARCINOMA EXCISION Left 07/2008   left forearm  THUMB ARTHROSCOPY Left 1999     Family History  Problem Relation Age of Onset   Hypertension Father    Cirrhosis Father 86   Alcohol abuse Father    Gallstones Mother    Hyperlipidemia Mother    Esophageal cancer Brother    Asthma Daughter    Coronary artery disease Neg Hx    Diabetes Neg Hx    Cancer Neg Hx        prostate or colon   Colon cancer Neg Hx    Rectal cancer Neg Hx    Stomach cancer Neg Hx      Social History   Socioeconomic History   Marital status: Married    Spouse name: Not on file   Number of children: 3   Years of education: Not on file   Highest education level: Not on file  Occupational History   Occupation: retired Primary school teacher: RETIRED  Tobacco Use   Smoking status: Never    Passive exposure: Never   Smokeless tobacco: Never  Vaping Use    Vaping Use: Never used  Substance and Sexual Activity   Alcohol use: Yes    Comment: occ. 3 per month   Drug use: Never   Sexual activity: Yes  Other Topics Concern   Not on file  Social History Narrative   Has living will   Wife is health care POA--alternate is son [name]   Would accept resuscitation attempts   Would accept tube feeds for some time--depending on prognosis   Social Determinants of Health   Financial Resource Strain: Not on file  Food Insecurity: Not on file  Transportation Needs: Not on file  Physical Activity: Not on file  Stress: Not on file  Social Connections: Not on file  Intimate Partner Violence: Not on file     BP 134/62   Pulse 64   Ht '5\' 8"'$  (1.727 m)   Wt 184 lb (83.5 kg)   SpO2 95%   BMI 27.98 kg/m   Physical Exam:  Well appearing NAD HEENT: Unremarkable Neck:  No JVD, no thyromegally Lymphatics:  No adenopathy Back:  No CVA tenderness Lungs:  Clear with no wheezes HEART:  Regular rate rhythm, no murmurs, no rubs, no clicks Abd:  soft, positive bowel sounds, no organomegally, no rebound, no guarding Ext:  2 plus pulses, no edema, no cyanosis, no clubbing Skin:  No rashes no nodules Neuro:  CN II through XII intact, motor grossly intact  EKG - atrial fib with a controlled VR  Assess/Plan:  1. Chronic atrial fib - his rates are well controlled. His CHADSVASC score is now 2. I have recommended he start eliquis.   2. HTN - his bp is minimally elevated on cardizem. He will continue this medication.   Carleene Overlie Lloyd Ayo,MD

## 2021-09-22 DIAGNOSIS — M5136 Other intervertebral disc degeneration, lumbar region: Secondary | ICD-10-CM | POA: Diagnosis not present

## 2021-09-22 DIAGNOSIS — M9902 Segmental and somatic dysfunction of thoracic region: Secondary | ICD-10-CM | POA: Diagnosis not present

## 2021-09-22 DIAGNOSIS — M62830 Muscle spasm of back: Secondary | ICD-10-CM | POA: Diagnosis not present

## 2021-09-22 DIAGNOSIS — L988 Other specified disorders of the skin and subcutaneous tissue: Secondary | ICD-10-CM | POA: Diagnosis not present

## 2021-09-22 DIAGNOSIS — M9903 Segmental and somatic dysfunction of lumbar region: Secondary | ICD-10-CM | POA: Diagnosis not present

## 2021-09-22 DIAGNOSIS — C4442 Squamous cell carcinoma of skin of scalp and neck: Secondary | ICD-10-CM | POA: Diagnosis not present

## 2021-09-23 ENCOUNTER — Telehealth: Payer: Self-pay | Admitting: Internal Medicine

## 2021-09-23 DIAGNOSIS — M7661 Achilles tendinitis, right leg: Secondary | ICD-10-CM | POA: Diagnosis not present

## 2021-09-23 NOTE — Telephone Encounter (Signed)
FYI--Patient states going forward he would like to have his Eliquis Rx sent through [facility].

## 2021-09-30 ENCOUNTER — Encounter: Payer: Self-pay | Admitting: Orthopaedic Surgery

## 2021-09-30 ENCOUNTER — Ambulatory Visit (INDEPENDENT_AMBULATORY_CARE_PROVIDER_SITE_OTHER): Payer: Medicare Other | Admitting: Orthopaedic Surgery

## 2021-09-30 DIAGNOSIS — M7661 Achilles tendinitis, right leg: Secondary | ICD-10-CM

## 2021-09-30 NOTE — Progress Notes (Signed)
The patient is a 71 year old gentleman who is following up after being in a cam walking boot for the last 5 weeks dealing with severe Achilles tendinitis of his right ankle.  He is now in physical therapy and doing well.  Does feel like it is getting better overall.  When I have him lay in a prone position, his [place] Silos test is negative and he has minimal pain today with his Achilles.  I can still palpate where the area was swollen but there is no evidence of tear.  He will continue to go slow and can transition out of the cam walking boot with still using it he will build up in his regular shoe.  He will continue outpatient physical therapy and we will see him back in 4 weeks to see how is doing overall.  All question concerns were answered and addressed.

## 2021-10-07 DIAGNOSIS — M5136 Other intervertebral disc degeneration, lumbar region: Secondary | ICD-10-CM | POA: Diagnosis not present

## 2021-10-07 DIAGNOSIS — M9902 Segmental and somatic dysfunction of thoracic region: Secondary | ICD-10-CM | POA: Diagnosis not present

## 2021-10-07 DIAGNOSIS — M9903 Segmental and somatic dysfunction of lumbar region: Secondary | ICD-10-CM | POA: Diagnosis not present

## 2021-10-07 DIAGNOSIS — M62830 Muscle spasm of back: Secondary | ICD-10-CM | POA: Diagnosis not present

## 2021-10-08 DIAGNOSIS — H903 Sensorineural hearing loss, bilateral: Secondary | ICD-10-CM | POA: Diagnosis not present

## 2021-10-08 DIAGNOSIS — M7661 Achilles tendinitis, right leg: Secondary | ICD-10-CM | POA: Diagnosis not present

## 2021-10-17 ENCOUNTER — Other Ambulatory Visit: Payer: Self-pay | Admitting: Internal Medicine

## 2021-10-21 ENCOUNTER — Ambulatory Visit (INDEPENDENT_AMBULATORY_CARE_PROVIDER_SITE_OTHER): Payer: Medicare Other | Admitting: Internal Medicine

## 2021-10-21 ENCOUNTER — Encounter: Payer: Self-pay | Admitting: Internal Medicine

## 2021-10-21 VITALS — BP 120/74 | HR 75 | Temp 97.5°F | Ht 68.0 in | Wt 185.0 lb

## 2021-10-21 DIAGNOSIS — I10 Essential (primary) hypertension: Secondary | ICD-10-CM

## 2021-10-21 DIAGNOSIS — J301 Allergic rhinitis due to pollen: Secondary | ICD-10-CM | POA: Diagnosis not present

## 2021-10-21 DIAGNOSIS — Z0000 Encounter for general adult medical examination without abnormal findings: Secondary | ICD-10-CM

## 2021-10-21 DIAGNOSIS — N138 Other obstructive and reflux uropathy: Secondary | ICD-10-CM | POA: Diagnosis not present

## 2021-10-21 DIAGNOSIS — I4819 Other persistent atrial fibrillation: Secondary | ICD-10-CM | POA: Diagnosis not present

## 2021-10-21 DIAGNOSIS — M9903 Segmental and somatic dysfunction of lumbar region: Secondary | ICD-10-CM | POA: Diagnosis not present

## 2021-10-21 DIAGNOSIS — M9902 Segmental and somatic dysfunction of thoracic region: Secondary | ICD-10-CM | POA: Diagnosis not present

## 2021-10-21 DIAGNOSIS — M353 Polymyalgia rheumatica: Secondary | ICD-10-CM

## 2021-10-21 DIAGNOSIS — Z125 Encounter for screening for malignant neoplasm of prostate: Secondary | ICD-10-CM

## 2021-10-21 DIAGNOSIS — M5136 Other intervertebral disc degeneration, lumbar region: Secondary | ICD-10-CM | POA: Diagnosis not present

## 2021-10-21 DIAGNOSIS — N401 Enlarged prostate with lower urinary tract symptoms: Secondary | ICD-10-CM | POA: Diagnosis not present

## 2021-10-21 DIAGNOSIS — M62830 Muscle spasm of back: Secondary | ICD-10-CM | POA: Diagnosis not present

## 2021-10-21 LAB — COMPREHENSIVE METABOLIC PANEL
ALT: 13 U/L (ref 0–53)
AST: 16 U/L (ref 0–37)
Albumin: 4.4 g/dL (ref 3.5–5.2)
Alkaline Phosphatase: 52 U/L (ref 39–117)
BUN: 15 mg/dL (ref 6–23)
CO2: 29 mEq/L (ref 19–32)
Calcium: 9.4 mg/dL (ref 8.4–10.5)
Chloride: 103 mEq/L (ref 96–112)
Creatinine, Ser: 0.88 mg/dL (ref 0.40–1.50)
GFR: 86.67 mL/min (ref >60.00)
Glucose, Bld: 90 mg/dL (ref 70–99)
Potassium: 4.7 mEq/L (ref 3.5–5.1)
Sodium: 139 mEq/L (ref 135–145)
Total Bilirubin: 0.9 mg/dL (ref 0.2–1.2)
Total Protein: 6.7 g/dL (ref 6.0–8.3)

## 2021-10-21 LAB — CBC
HCT: 43 % (ref 39.0–52.0)
Hemoglobin: 14.2 g/dL (ref 13.0–17.0)
MCHC: 33.1 g/dL (ref 30.0–36.0)
MCV: 96.4 fl (ref 78.0–100.0)
Platelets: 256 10*3/uL (ref 150.0–400.0)
RBC: 4.46 Mil/uL (ref 4.22–5.81)
RDW: 14.2 % (ref 11.5–15.5)
WBC: 5.7 10*3/uL (ref 4.0–10.5)

## 2021-10-21 LAB — LIPID PANEL
Cholesterol: 205 mg/dL — ABNORMAL HIGH (ref 0–200)
HDL: 48.2 mg/dL (ref >39.00)
LDL Cholesterol: 125 mg/dL — ABNORMAL HIGH (ref 0–99)
NonHDL: 156.6
Total CHOL/HDL Ratio: 4
Triglycerides: 156 mg/dL — ABNORMAL HIGH (ref 0.0–149.0)
VLDL: 31.2 mg/dL (ref 0.0–40.0)

## 2021-10-21 LAB — T4, FREE: Free T4: 1.13 ng/dL (ref 0.60–1.60)

## 2021-10-21 LAB — SEDIMENTATION RATE: Sed Rate: 4 mm/hr (ref 0–20)

## 2021-10-21 LAB — PSA, MEDICARE: PSA: 1.6 ng/ml (ref 0.10–4.00)

## 2021-10-21 IMAGING — CT CT RENAL STONE PROTOCOL
2 of 4 series · 15 of 46 positions shown, 17 images · non-contrast
Comparison: No priors.

CLINICAL DATA: 70-year-old male with history of flank pain.
Suspected kidney stone.

EXAM:
CT ABDOMEN AND PELVIS WITHOUT CONTRAST
TECHNIQUE: Multidetector CT imaging of the abdomen and pelvis was performed
following the standard protocol without IV contrast.

[Series 2: stone full standard · axial · 0.79mm/px · z∈[-1240,-815]mm · 12 of 93 slices shown, 14 images]
[im 4/93  soft-tissue]
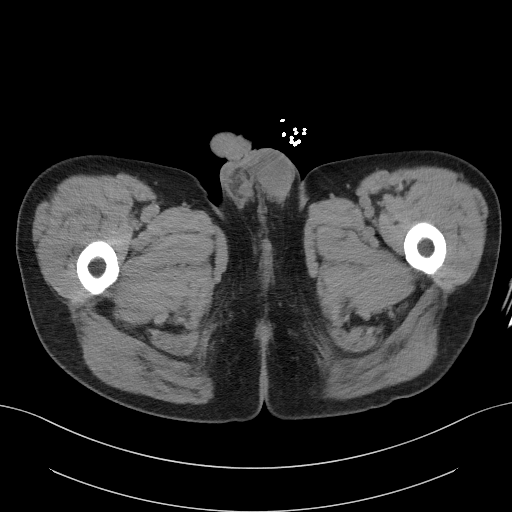
[im 4/93  bone]
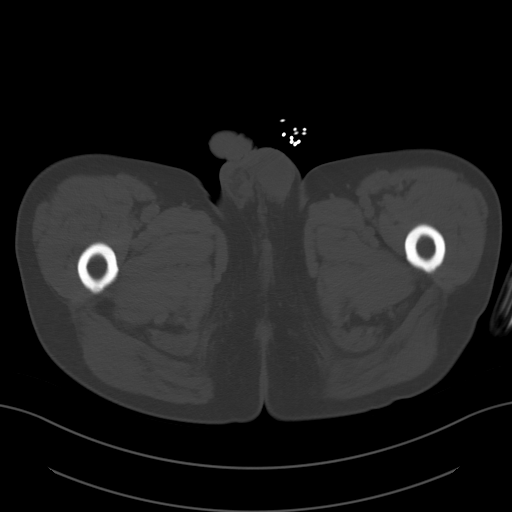
[im 12/93  soft-tissue]
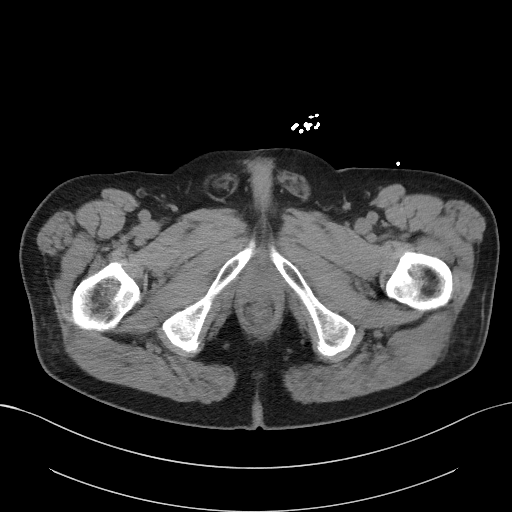
[im 20/93  soft-tissue]
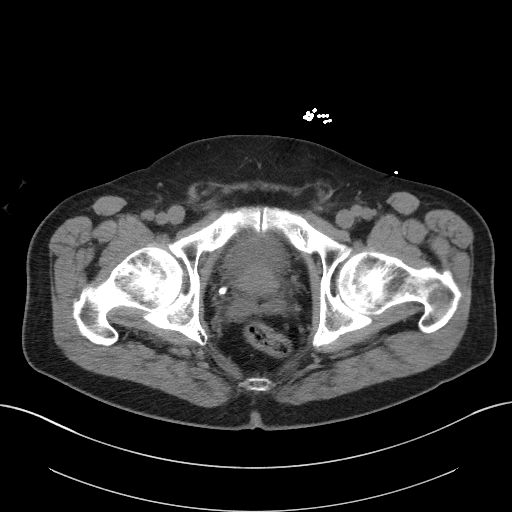
[im 27/93  soft-tissue]
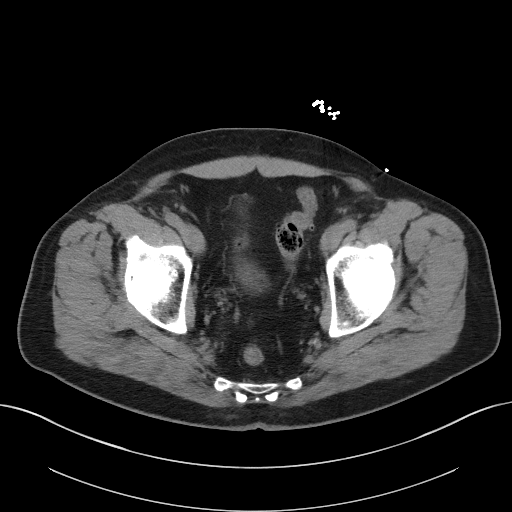
[im 35/93  soft-tissue]
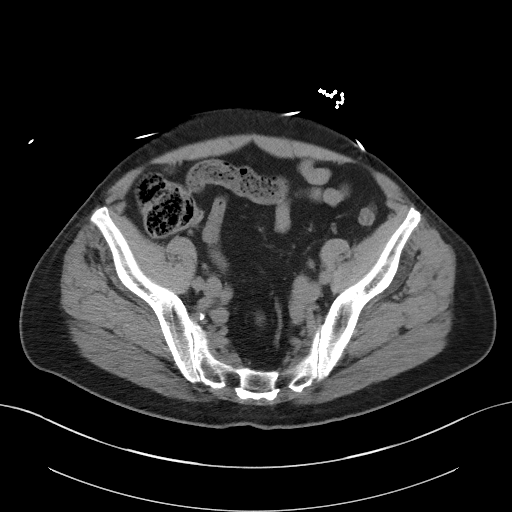
[im 43/93  soft-tissue]
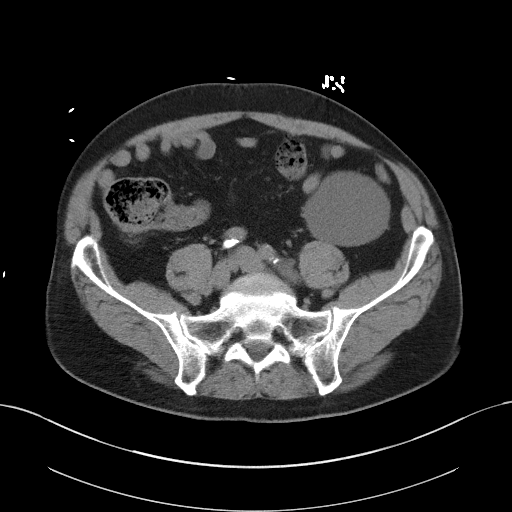
[im 50/93  soft-tissue]
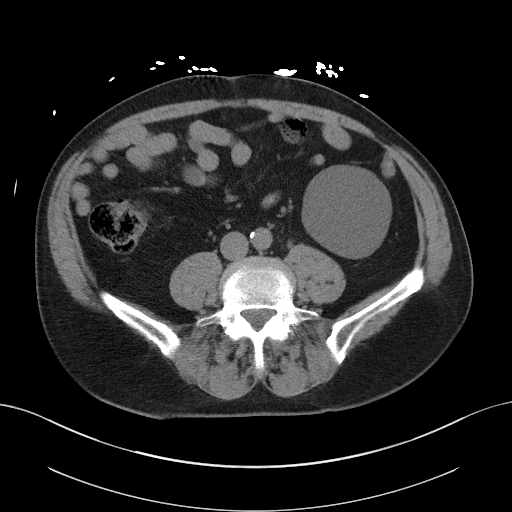
[im 58/93  soft-tissue]
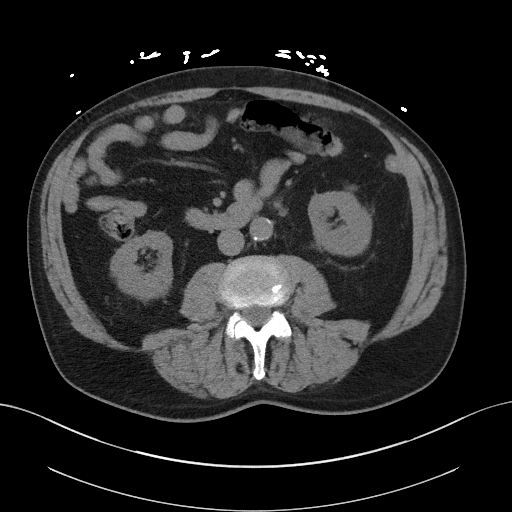
[im 66/93  soft-tissue]
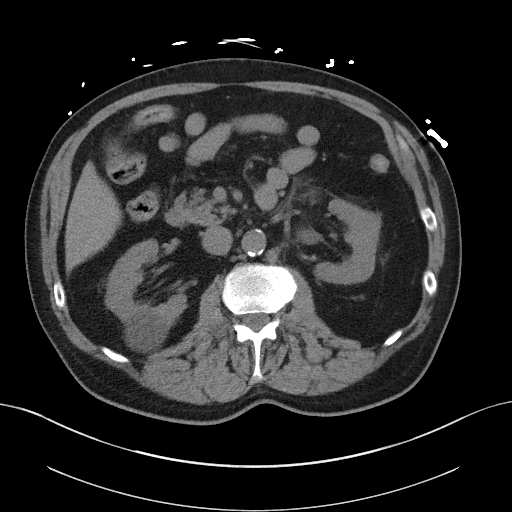
[im 66/93  bone]
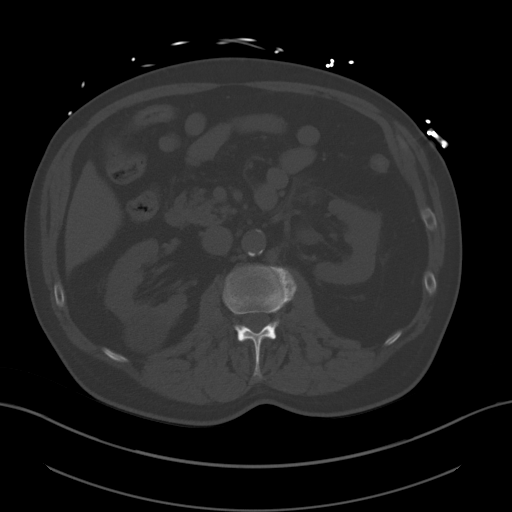
[im 73/93  soft-tissue]
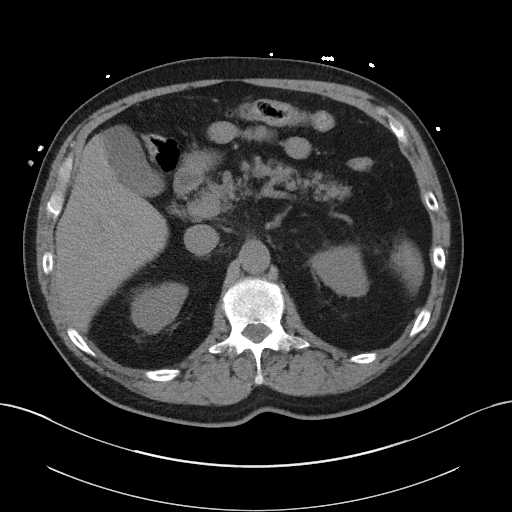
[im 81/93  soft-tissue]
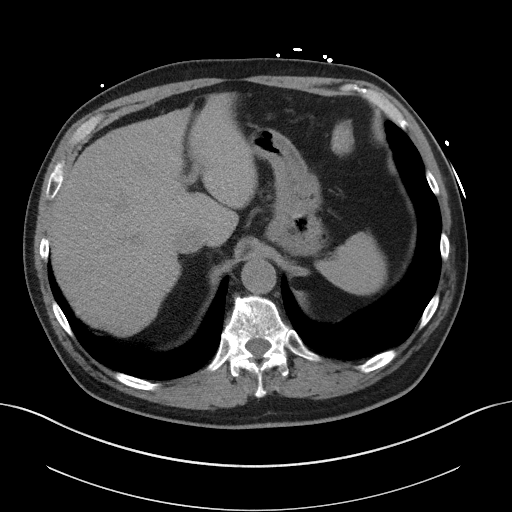
[im 89/93  soft-tissue]
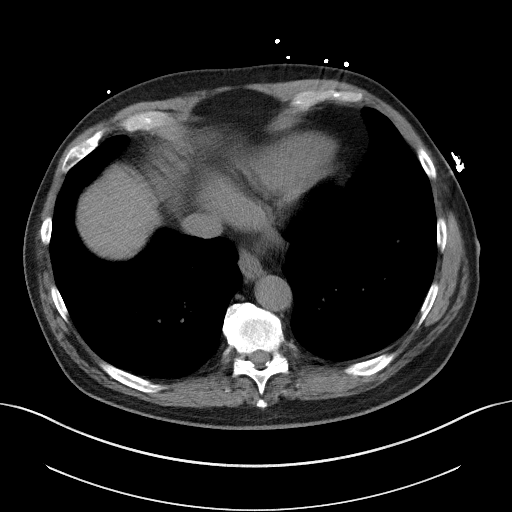

[Series 5: coronal · coronal · 0.77mm/px · 3 of 151 slices shown]
[im 51/151  soft-tissue]
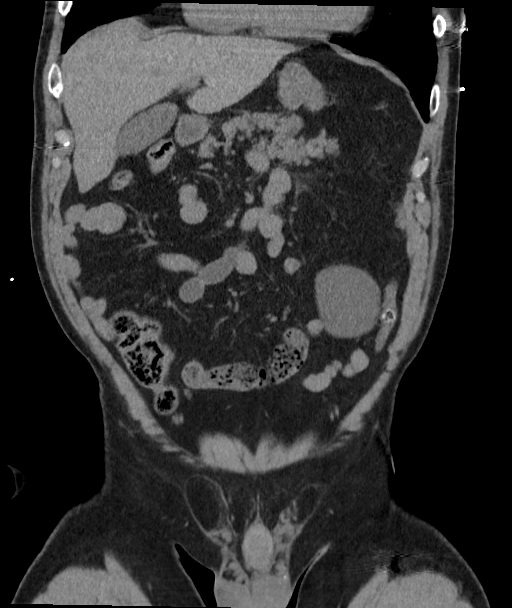
[im 67/151  soft-tissue]
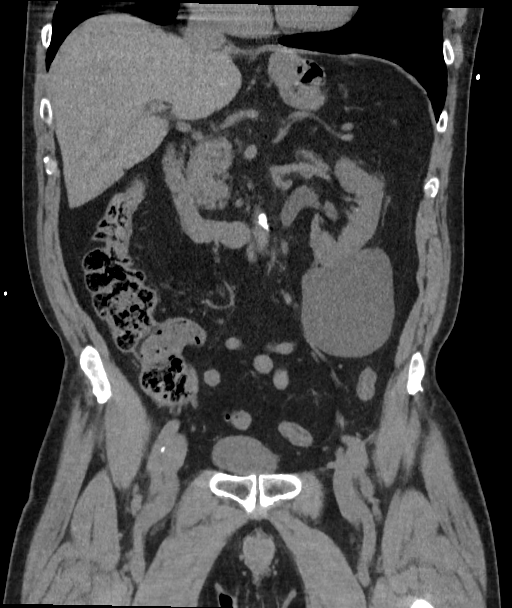
[im 84/151  soft-tissue]
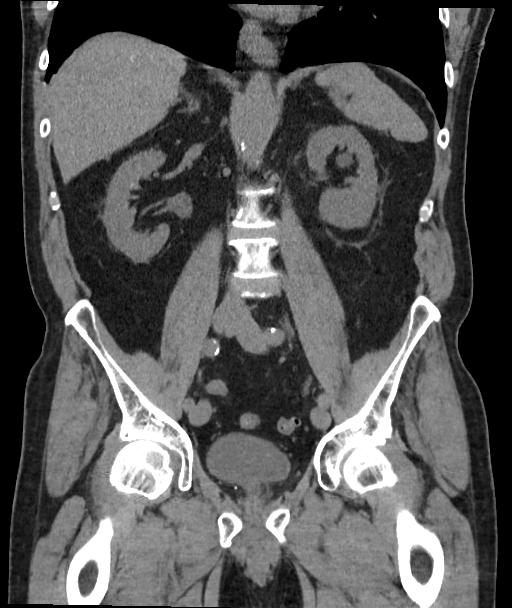

[15 of 46 positions shown; findings below may reference images not displayed]

FINDINGS: Lower chest: Atherosclerotic calcifications in the descending
thoracic aorta. Small hiatal hernia.

Hepatobiliary: Subcentimeter low-attenuation lesion in segment 3 of
the liver, incompletely characterized on today's non-contrast CT
examination, but statistically likely to represent a small cyst. No
other larger more suspicious appearing hepatic lesions are
confidently identified on today's noncontrast CT examination.
Unenhanced appearance of the gallbladder is normal.

Pancreas: No definite pancreatic mass or peripancreatic fluid
collections or inflammatory changes are noted on today's noncontrast
CT examination.

Spleen: Unremarkable.

Adrenals/Urinary Tract: 1 mm nonobstructive calculus in the
interpolar collecting system of the left kidney. In addition, in the
proximal third of the left ureter just beyond the left ureteropelvic
junction there is a 3 mm calculus (axial image 40 of series 2) which
is associated with mild proximal left hydroureteronephrosis and left
perinephric stranding. No additional calculi are noted along the
course of the right ureter or within the lumen of the urinary
bladder. No right hydroureteronephrosis. Low-attenuation lesions in
both kidneys, incompletely characterized on today's non-contrast CT
examination, but statistically likely to represent cysts, largest of
which is an exophytic lesion in the lower pole of the left kidney
measuring up to 9 cm in diameter. Unenhanced appearance of the
urinary bladder is normal. Bilateral adrenal glands are normal in
appearance.

Stomach/Bowel: Unenhanced appearance of the stomach is normal. No
pathologic dilatation of small bowel or colon. Numerous colonic
diverticulae are noted, particularly in the region of the sigmoid
colon, without surrounding inflammatory changes to suggest an acute
diverticulitis at this time. Normal appendix.

Vascular/Lymphatic: Atherosclerotic calcifications in the abdominal
aorta and pelvic vasculature. Circumaortic left renal vein (normal
anatomical variant) incidentally noted. No lymphadenopathy noted in
the abdomen or pelvis.

Reproductive: Prostate gland and seminal vesicles are unremarkable
in appearance.

Other: No significant volume of ascites.  No pneumoperitoneum.

Musculoskeletal: There are no aggressive appearing lytic or blastic
lesions noted in the visualized portions of the skeleton.
IMPRESSION: 1. 3 mm calculus in the proximal third of the left ureter just
beyond the left ureteropelvic junction with mild proximal left
hydroureteronephrosis and left perinephric stranding indicating mild
obstruction at this time.
2. 1 mm nonobstructive calculus in the interpolar collecting system
of the left kidney.
3. Colonic diverticulosis without evidence of acute diverticulitis
at this time.
4. Aortic atherosclerosis.
5. Small hiatal hernia.

## 2021-10-21 NOTE — Assessment & Plan Note (Signed)
I have personally reviewed the Medicare Annual Wellness questionnaire and have noted 1. The patient's medical and social history 2. Their use of alcohol, tobacco or illicit drugs 3. Their current medications and supplements 4. The patient's functional ability including ADL's, fall risks, home safety risks and hearing or visual             impairment. 5. Diet and physical activities 6. Evidence for depression or mood disorders  The patients weight, height, BMI and visual acuity have been recorded in the chart I have made referrals, counseling and provided education to the patient based review of the above and I have provided the pt with a written personalized care plan for preventive services.  I have provided you with a copy of your personalized plan for preventive services. Please take the time to review along with your updated medication list.  Colon due 2026--last screening Will check one last PSA Updated COVID and flu vaccines this fall Consider shingrix at pharmacy Does exercise regularly--working on this

## 2021-10-21 NOTE — Progress Notes (Signed)
Hearing Screening - Comments:: Has hearing aids. Wearing them today.  Vision Screening - Comments:: March 2023  

## 2021-10-21 NOTE — Assessment & Plan Note (Signed)
Uses montelukast prn only

## 2021-10-21 NOTE — Assessment & Plan Note (Signed)
Now down to 7.'5mg'$  every other day Okay to try further wean (5 every other day) if sed rate is normal

## 2021-10-21 NOTE — Assessment & Plan Note (Signed)
BP Readings from Last 3 Encounters:  10/21/21 120/74  09/21/21 134/62  09/01/21 (!) 149/80   Good control on the diltiazem

## 2021-10-21 NOTE — Assessment & Plan Note (Signed)
Rate is controlled on diltiazem 180 daily Now on eliquis 5 bid

## 2021-10-21 NOTE — Progress Notes (Signed)
Subjective:    Patient ID: Rodney Gray, male    DOB: 10-17-1950, 71 y.o.   MRN: [id number]  HPI Here for Medicare wellness visit and follow up of chronic health conditions Reviewed advanced directives Reviewed other doctors---Dr Alexis Goodell, Dr Alecia Lemming cardiology, Dr Kathleene Hazel, Dr Jenny Reichmann [place]--chiropractor, Dr Jeannett Senior, Dr Delight Ovens, Dr Gillian Scarce, Dr Babs Bertin No hospitalizations or surgery No recent alcohol No tobacco Vision is okay Hearing aides are helpful No falls No depression or anhedonia----just bothered by pain issues Independent with instrumental ADLs Mild memory issues--nothing worrisome  Had 2 episodes of kidney stones last year Quiet now  2 episodes of COVID  Seeing ortho Needed cortisone in right shoulder Now with possible partial Achilles tear on left Conservative Rx---then wore boot for 6 weeks Now back to full strength--but doing better (still some tenderness) Riding bike ---not back to walking (and doing resistance)  Saw urologist about ED Got daily tadalafil--helping some No problems voiding now--just has some urgency Less nocturia  Heart is okay Now on eliquis---due to age No palpitations--but can tell irregular pulse Tired at times--especially in heat No SOB  Rare dizziness --no syncope No edema  No apparent myalgia--just some arthritic shoulder pain Now down to 7.'5mg'$  every other day  More heartburn--taking liquid antacid like 3 times a week No dysphagia  Current Outpatient Medications on File Prior to Visit  Medication Sig Dispense Refill   apixaban (ELIQUIS) 5 MG TABS tablet Take 1 tablet (5 mg total) by mouth 2 (two) times daily. 60 tablet 11   clindamycin (CLEOCIN) 150 MG capsule Take 4 capsules by mouth as needed. Prior to dental appointments     diltiazem (CARDIZEM CD) 180 MG 24 hr capsule TAKE 1 CAPSULE BY MOUTH EVERY DAY 90 capsule 3   hydrocortisone (ANUSOL-HC) 2.5 % rectal cream Place 1  application rectally 2 times daily as needed 30 g 0   montelukast (SINGULAIR) 10 MG tablet TAKE 1 TABLET (10 MG TOTAL) BY MOUTH AS NEEDED. ALLERGIES 90 tablet 1   Multiple Vitamins-Minerals (MENS MULTIVITAMIN PLUS PO) Take 1 capsule by mouth daily.     Omega-3 Fatty Acids (FISH OIL) 1200 MG CAPS Take 1 capsule by mouth daily.     predniSONE (DELTASONE) 5 MG tablet TAKE 2 TABLETS (10 MG TOTAL) BY MOUTH EVERY OTHER DAY. WEAN AS DIRECTED (Patient taking differently: Take 7.5 mg by mouth every other day. Wean as directed) 100 tablet 1   tadalafil (CIALIS) 5 MG tablet Take 1 tablet (5 mg total) by mouth daily. 30 tablet 11   No current facility-administered medications on file prior to visit.    Allergies  Allergen Reactions   Ampicillin     REACTION: hives Has tolerated cephalosporins    Past Medical History:  Diagnosis Date   Allergic rhinitis due to pollen    Atrial fibrillation ([place])    A-Fib, 1987, 2003, 2005, EPS RX 2005 (A-Fib reocurred)   Basal cell carcinoma    skin CA- squamous and Basal cell   BPH (benign prostatic hypertrophy)    Cataract    Hyperlipemia    no per pt   Hypertension    no per pt   Osteoarthritis    Polymyalgia rheumatica ([place])    Squamous cell carcinoma in situ     Past Surgical History:  Procedure Laterality Date   CAPSULOTOMY Bilateral 08/2007   CATARACT EXTRACTION Bilateral    both eyes   COLONOSCOPY     MENISCUS REPAIR Left 09/2009   left knee---Dr  Blackmon   NASAL SINUS SURGERY  May 2014   RETINAL TEAR REPAIR CRYOTHERAPY Right    right 12/06   RETINAL TEAR REPAIR CRYOTHERAPY Right    Retinal tear/bleed- Right 12/06   SQUAMOUS CELL CARCINOMA EXCISION Left 07/2008   left forearm   THUMB ARTHROSCOPY Left 1999    Family History  Problem Relation Age of Onset   Hypertension Father    Cirrhosis Father 68   Alcohol abuse Father    Gallstones Mother    Hyperlipidemia Mother    Esophageal cancer Brother    Asthma Daughter    Coronary  artery disease Neg Hx    Diabetes Neg Hx    Cancer Neg Hx        prostate or colon   Colon cancer Neg Hx    Rectal cancer Neg Hx    Stomach cancer Neg Hx     Social History   Socioeconomic History   Marital status: Married    Spouse name: Not on file   Number of children: 3   Years of education: Not on file   Highest education level: Not on file  Occupational History   Occupation: retired Primary school teacher: RETIRED  Tobacco Use   Smoking status: Never    Passive exposure: Never   Smokeless tobacco: Never  Vaping Use   Vaping Use: Never used  Substance and Sexual Activity   Alcohol use: Yes    Comment: occ. 3 per month   Drug use: Never   Sexual activity: Yes  Other Topics Concern   Not on file  Social History Narrative   Has living will   Wife is health care POA--alternate is son [name]   Would accept resuscitation attempts   Would accept tube feeds for some time--depending on prognosis   Social Determinants of Health   Financial Resource Strain: Not on file  Food Insecurity: Not on file  Transportation Needs: Not on file  Physical Activity: Not on file  Stress: Not on file  Social Connections: Not on file  Intimate Partner Violence: Not on file   Review of Systems Appetite is good Weight stable Mild sleep issues---nocturia x 1 Wears seat belt Teeth are okay---keeps up with dentist Had skin excision on neck --delayed healing of this Bowels okay---often gets up 4-5AM to go     Objective:   Physical Exam Constitutional:      Appearance: Normal appearance.  HENT:     Mouth/Throat:     Comments: No lesions Eyes:     Conjunctiva/sclera: Conjunctivae normal.     Pupils: Pupils are equal, round, and reactive to light.  Cardiovascular:     Rate and Rhythm: Normal rate. Rhythm irregular.     Pulses: Normal pulses.     Heart sounds: No murmur heard.    No gallop.  Pulmonary:     Effort: Pulmonary effort is normal.     Breath sounds:  Normal breath sounds. No wheezing or rales.  Abdominal:     Palpations: Abdomen is soft.     Tenderness: There is no abdominal tenderness.  Musculoskeletal:     Cervical back: Neck supple.     Right lower leg: No edema.     Left lower leg: No edema.  Lymphadenopathy:     Cervical: No cervical adenopathy.  Skin:    Findings: No rash.  Neurological:     General: No focal deficit present.     Mental Status: He is  alert and oriented to person, place, and time.     Comments: Mini-cog normal  Psychiatric:        Mood and Affect: Mood normal.        Behavior: Behavior normal.            Assessment & Plan:

## 2021-10-21 NOTE — Assessment & Plan Note (Signed)
Mild improvement with the tadalafil '5mg'$  daily

## 2021-10-25 IMAGING — DX DG ABDOMEN 1V
1 series · 1 of 1 positions shown · non-contrast
Comparison: Abdominopelvic CT 11/28/2020.

CLINICAL DATA: Left-sided flank pain.  History of kidney stones.

EXAM:
ABDOMEN - 1 VIEW

[abdomen supine]
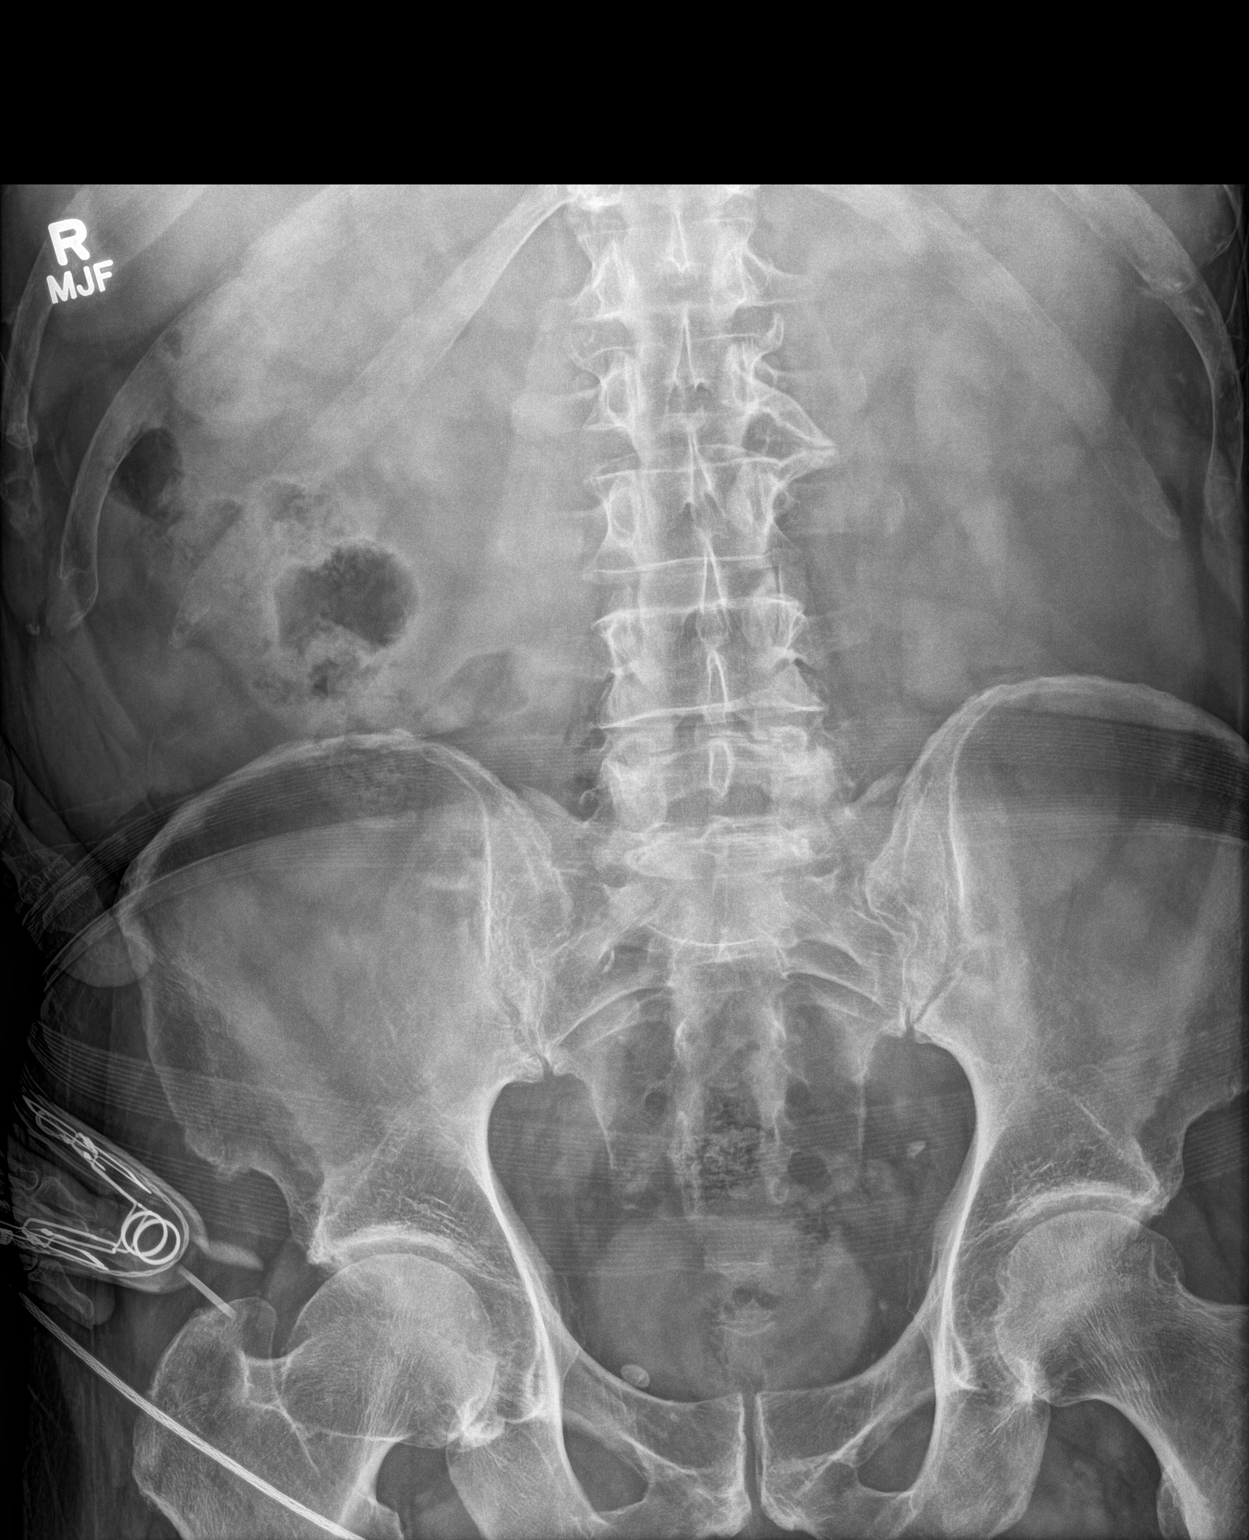

[1 of 1 positions shown; findings below may reference images not displayed]

FINDINGS: The bowel gas pattern is normal. The previously demonstrated tiny
proximal left ureteral calculus was not well seen on the CT scout
image due to size and overlying EKG leads. This makes assessment for
persistence or passage difficult. There is a potential tiny
calcification inferior to the left L4 spinous process which is the
general area of the calculus on previous CT. Pelvic calcifications
are likely vascular and unchanged. There are mild degenerative
changes in the spine.
IMPRESSION: Study is indeterminate for passage of the previously demonstrated
proximal left ureteral calculus given the small size of the calculus
and overlying EKG leads on prior CT.

## 2021-10-29 ENCOUNTER — Other Ambulatory Visit: Payer: Self-pay

## 2021-10-29 ENCOUNTER — Telehealth: Payer: Self-pay | Admitting: Internal Medicine

## 2021-10-29 MED ORDER — APIXABAN 5 MG PO TABS: 5.0000 mg | ORAL_TABLET | Freq: Two times a day (BID) | ORAL | 1 refills | Status: DC

## 2021-10-29 NOTE — Telephone Encounter (Signed)
*  STAT* If patient is at the pharmacy, call can be transferred to refill team.   1. Which medications need to be refilled? (please list name of each medication and dose if known)   apixaban (ELIQUIS) 5 MG TABS tablet   2. Which pharmacy/location (including street and city if local pharmacy) is medication to be sent to?  [place] ([facility] ) - [place], [place] 3. Do they need a 30 day or 90 day supply?  90 day

## 2021-10-29 NOTE — Telephone Encounter (Signed)
Prescription refill request for Eliquis received. Indication:Afib Last office visit:6/23 Scr:0.8 Age: 71 Weight:83.9 kg  Prescription refilled

## 2021-11-02 ENCOUNTER — Encounter: Payer: Self-pay | Admitting: Orthopaedic Surgery

## 2021-11-02 ENCOUNTER — Ambulatory Visit (INDEPENDENT_AMBULATORY_CARE_PROVIDER_SITE_OTHER): Payer: Medicare Other | Admitting: Orthopaedic Surgery

## 2021-11-02 DIAGNOSIS — M7661 Achilles tendinitis, right leg: Secondary | ICD-10-CM

## 2021-11-02 NOTE — Progress Notes (Signed)
The patient is coming in for follow-up as it relates to his severe Achilles tendinitis involving the right ankle.  He has worked on activity modification and did ambulate with a cam walking boot.  He had a heel build up as well and has been through extensive outpatient physical therapy.  He says he is doing really well now and is walking fine in his tennis shoes.  He has been discharged in physical therapy.  He is a very active 71 year old gentleman.  He has excellent range of motion of his right ankle and no pain of the Achilles tendon.  His Thompson test is negative.  At this point follow-up is as needed for his right ankle.  If there is any issues at all he knows to let us know.  He does have some chronic right shoulder issues and if this gets worse he will let us know.  All questions and concerns were answered and addressed.  Follow-up is as needed.

## 2021-11-10 ENCOUNTER — Other Ambulatory Visit: Payer: Self-pay | Admitting: Internal Medicine

## 2021-11-24 DIAGNOSIS — M5136 Other intervertebral disc degeneration, lumbar region: Secondary | ICD-10-CM | POA: Diagnosis not present

## 2021-11-24 DIAGNOSIS — M9902 Segmental and somatic dysfunction of thoracic region: Secondary | ICD-10-CM | POA: Diagnosis not present

## 2021-11-24 DIAGNOSIS — M9903 Segmental and somatic dysfunction of lumbar region: Secondary | ICD-10-CM | POA: Diagnosis not present

## 2021-11-24 DIAGNOSIS — M62830 Muscle spasm of back: Secondary | ICD-10-CM | POA: Diagnosis not present

## 2021-12-08 ENCOUNTER — Ambulatory Visit: Payer: Medicare Other | Admitting: Urology

## 2021-12-09 ENCOUNTER — Ambulatory Visit: Payer: Medicare Other | Admitting: Urology

## 2021-12-09 ENCOUNTER — Encounter: Payer: Self-pay | Admitting: Urology

## 2021-12-09 ENCOUNTER — Ambulatory Visit
Admission: RE | Admit: 2021-12-09 | Discharge: 2021-12-09 | Disposition: A | Discharge status: Home / Self Care | Payer: Medicare Other | Attending: Urology | Admitting: Urology

## 2021-12-09 ENCOUNTER — Ambulatory Visit (INDEPENDENT_AMBULATORY_CARE_PROVIDER_SITE_OTHER): Payer: Medicare Other | Admitting: Urology

## 2021-12-09 ENCOUNTER — Other Ambulatory Visit: Payer: Self-pay | Admitting: *Deleted

## 2021-12-09 ENCOUNTER — Ambulatory Visit
Admission: RE | Admit: 2021-12-09 | Discharge: 2021-12-09 | Disposition: A | Discharge status: Home / Self Care | Payer: Medicare Other | Source: Ambulatory Visit | Attending: Urology | Admitting: Urology

## 2021-12-09 VITALS — BP 134/72 | HR 63 | Ht 68.0 in | Wt 185.0 lb

## 2021-12-09 DIAGNOSIS — Z87442 Personal history of urinary calculi: Secondary | ICD-10-CM

## 2021-12-09 DIAGNOSIS — N200 Calculus of kidney: Secondary | ICD-10-CM | POA: Diagnosis not present

## 2021-12-09 DIAGNOSIS — I878 Other specified disorders of veins: Secondary | ICD-10-CM | POA: Diagnosis not present

## 2021-12-09 DIAGNOSIS — N529 Male erectile dysfunction, unspecified: Secondary | ICD-10-CM

## 2021-12-09 DIAGNOSIS — N3281 Overactive bladder: Secondary | ICD-10-CM

## 2021-12-09 LAB — BLADDER SCAN AMB NON-IMAGING

## 2021-12-09 MED ORDER — TADALAFIL 5 MG PO TABS: 5.0000 mg | ORAL_TABLET | Freq: Every day | ORAL | 8 refills | Status: DC

## 2021-12-09 NOTE — Progress Notes (Signed)
   12/09/2021 10:00 AM   Rodney Gray 11-08-1950 [id number]  Reason for visit: Follow up ED, history of kidney stones  HPI: I saw Rodney Gray and his wife today for the above issues.  When we last met in May 2023 he had some concerns about problems with erections over at least 1 year.  He had tried multiple over-the-counter supplements without improvement.  We checked a testosterone which was normal at that time at 509, and he opted for a trial of Cialis 5 mg daily.  He thinks the Cialis has helped somewhat, but still having some problems with ED.  I recommended increasing the dose to 10 mg daily, or adding a " boost" 5 mg dose prior to sexual activity.  Risk and benefits were discussed.  We also reviewed some benefits of plant-based diet and exercise and the effect on overall health as well as erections.  He spontaneously passed a small kidney stone in September 2022, and denies any stone since that time.  I personally reviewed and interpreted the KUB today that shows no evidence of kidney stones.  Diet strategies discussed including adequate hydration, increase citrate intake, low-salt diet, and avoiding high oxalate foods.  Cialis dose increased RTC 1 year KUB prior  Billey Co, MD  [facility] [address], [place], [place] [address] [phone]

## 2021-12-09 NOTE — Patient Instructions (Signed)
The Benefits of a Plant-Based Diet for Urology Health  A plant-based diet emphasizes the consumption of whole, unprocessed plant foods while minimizing or excluding animal products including meat and dairy products. This dietary approach has gained attention for its potential to promote overall health, including urology-related conditions. Incorporating a plant-based diet into your lifestyle can offer numerous benefits for maintaining optimal urology health.  1. Reduced Risk of Kidney Stones: A plant-based diet is typically rich in fruits, vegetables, legumes, and whole grains. These foods are high in dietary fiber, potassium, and magnesium, which can help reduce the risk of developing kidney stones. Be careful to avoid high quantities of spinach, as these can contribute to kidney stone formation if eaten in large volumes. The increased intake of water-soluble fiber can enhance the excretion of waste products and prevent the crystallization of minerals that lead to stone formation.  2. Improved Prostate Health: Studies have suggested a link between the consumption of red and processed meats and an increased risk of prostate problems, including benign prostatic hyperplasia (BPH) and prostate cancer. By adopting a plant-based diet, you can lower your intake of saturated fats and decrease the risk of these conditions. PSA levels can often decrease on plant based diets! Plant foods are also rich in antioxidants and phytochemicals that have been associated with prostate health.  3. Better Bladder Function: A diet focused on plant-based foods can contribute to better bladder health by reducing the risk of urinary tract infections (UTIs). Berries, citrus fruits, and leafy greens are known for their high vitamin C content, which can acidify urine and create an environment less favorable for bacteria growth. Additionally, plant-based diets are generally lower in sodium, which can help prevent fluid retention and  reduce the strain on the bladder.  4. Management of Erectile Dysfunction (ED): Some research suggests that a plant-based diet can positively impact erectile function. Plant-based diets are associated with improved cardiovascular health, which is crucial for maintaining healthy blood flow and nerve function required for proper erectile function. By reducing the consumption of high-cholesterol and high-saturated fat animal products, a plant-based diet may contribute to a decreased risk of ED.  5. Prevention of Chronic Conditions: A plant-based diet can help prevent or manage chronic conditions such as obesity, diabetes, and hypertension. These conditions can contribute to urology-related issues, including urinary incontinence and kidney dysfunction. By maintaining a healthy weight and managing these conditions, you can reduce the risk of urology-related complications.  Conclusion: Embracing a plant-based diet can offer significant benefits for urology health. By incorporating a variety of colorful fruits, vegetables, whole grains, nuts, seeds, and legumes into your meals, you can support kidney health, prostate health, bladder function, and overall well-being. Remember to consult with a healthcare professional or registered dietitian before making any significant dietary changes, especially if you have existing health conditions. Your personalized approach to a plant-based diet can contribute to improved urology health and enhance your quality of life.       The documentary "The Game Changers" has lots of good information on diet and health benefits including ED.

## 2021-12-21 DIAGNOSIS — L570 Actinic keratosis: Secondary | ICD-10-CM | POA: Diagnosis not present

## 2021-12-21 DIAGNOSIS — Z85828 Personal history of other malignant neoplasm of skin: Secondary | ICD-10-CM | POA: Diagnosis not present

## 2021-12-22 DIAGNOSIS — M5136 Other intervertebral disc degeneration, lumbar region: Secondary | ICD-10-CM | POA: Diagnosis not present

## 2021-12-22 DIAGNOSIS — M9902 Segmental and somatic dysfunction of thoracic region: Secondary | ICD-10-CM | POA: Diagnosis not present

## 2021-12-22 DIAGNOSIS — M62830 Muscle spasm of back: Secondary | ICD-10-CM | POA: Diagnosis not present

## 2021-12-22 DIAGNOSIS — M9903 Segmental and somatic dysfunction of lumbar region: Secondary | ICD-10-CM | POA: Diagnosis not present

## 2022-01-04 DIAGNOSIS — Z23 Encounter for immunization: Secondary | ICD-10-CM | POA: Diagnosis not present

## 2022-01-19 DIAGNOSIS — M5136 Other intervertebral disc degeneration, lumbar region: Secondary | ICD-10-CM | POA: Diagnosis not present

## 2022-01-19 DIAGNOSIS — M62830 Muscle spasm of back: Secondary | ICD-10-CM | POA: Diagnosis not present

## 2022-01-19 DIAGNOSIS — M9902 Segmental and somatic dysfunction of thoracic region: Secondary | ICD-10-CM | POA: Diagnosis not present

## 2022-01-19 DIAGNOSIS — M9903 Segmental and somatic dysfunction of lumbar region: Secondary | ICD-10-CM | POA: Diagnosis not present

## 2022-02-16 DIAGNOSIS — M62830 Muscle spasm of back: Secondary | ICD-10-CM | POA: Diagnosis not present

## 2022-02-16 DIAGNOSIS — M9903 Segmental and somatic dysfunction of lumbar region: Secondary | ICD-10-CM | POA: Diagnosis not present

## 2022-02-16 DIAGNOSIS — M9902 Segmental and somatic dysfunction of thoracic region: Secondary | ICD-10-CM | POA: Diagnosis not present

## 2022-02-16 DIAGNOSIS — M5136 Other intervertebral disc degeneration, lumbar region: Secondary | ICD-10-CM | POA: Diagnosis not present

## 2022-03-04 DIAGNOSIS — D0462 Carcinoma in situ of skin of left upper limb, including shoulder: Secondary | ICD-10-CM | POA: Diagnosis not present

## 2022-03-04 DIAGNOSIS — D485 Neoplasm of uncertain behavior of skin: Secondary | ICD-10-CM | POA: Diagnosis not present

## 2022-03-16 DIAGNOSIS — M9903 Segmental and somatic dysfunction of lumbar region: Secondary | ICD-10-CM | POA: Diagnosis not present

## 2022-03-16 DIAGNOSIS — M62830 Muscle spasm of back: Secondary | ICD-10-CM | POA: Diagnosis not present

## 2022-03-16 DIAGNOSIS — M5136 Other intervertebral disc degeneration, lumbar region: Secondary | ICD-10-CM | POA: Diagnosis not present

## 2022-03-16 DIAGNOSIS — M9902 Segmental and somatic dysfunction of thoracic region: Secondary | ICD-10-CM | POA: Diagnosis not present

## 2022-03-30 ENCOUNTER — Ambulatory Visit (INDEPENDENT_AMBULATORY_CARE_PROVIDER_SITE_OTHER)
Admission: RE | Admit: 2022-03-30 | Discharge: 2022-03-30 | Disposition: A | Discharge status: Home / Self Care | Payer: Medicare Other | Source: Ambulatory Visit | Attending: Internal Medicine | Admitting: Internal Medicine

## 2022-03-30 ENCOUNTER — Encounter: Payer: Self-pay | Admitting: Internal Medicine

## 2022-03-30 ENCOUNTER — Ambulatory Visit (INDEPENDENT_AMBULATORY_CARE_PROVIDER_SITE_OTHER): Payer: Medicare Other | Admitting: Internal Medicine

## 2022-03-30 VITALS — BP 126/72 | HR 77 | Temp 99.5°F | Ht 68.0 in | Wt 181.0 lb

## 2022-03-30 DIAGNOSIS — J22 Unspecified acute lower respiratory infection: Secondary | ICD-10-CM

## 2022-03-30 DIAGNOSIS — R0989 Other specified symptoms and signs involving the circulatory and respiratory systems: Secondary | ICD-10-CM | POA: Diagnosis not present

## 2022-03-30 DIAGNOSIS — R059 Cough, unspecified: Secondary | ICD-10-CM | POA: Diagnosis not present

## 2022-03-30 MED ORDER — DOXYCYCLINE HYCLATE 100 MG PO TABS: 100.0000 mg | ORAL_TABLET | Freq: Two times a day (BID) | ORAL | 0 refills | Status: DC

## 2022-03-30 MED ORDER — PREDNISONE 20 MG PO TABS: 40.0000 mg | ORAL_TABLET | Freq: Every day | ORAL | 0 refills | Status: DC

## 2022-03-30 NOTE — Progress Notes (Signed)
Subjective:    Patient ID: Rodney Gray, male    DOB: 04-07-1950, 71 y.o.   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 4 days ago--got worse in his ears (hearing off and pressure) Maxillary and frontal pressure Some fever---woke with shakes and shivering Nasty discharge from eyes and throat (from drainage)---yellow and green Body aches Slight SOB Energy is off  Home COVID test negative yesterday  Tried coricidin and Vick's vapor Hot compresses on forehead Took tylenol last night  Now having some pain in RLQ with cough No clear bulging  Current Outpatient Medications on File Prior to Visit  Medication Sig Dispense Refill   apixaban (ELIQUIS) 5 MG TABS tablet Take 1 tablet (5 mg total) by mouth 2 (two) times daily. 180 tablet 1   clindamycin (CLEOCIN) 150 MG capsule Take 4 capsules by mouth as needed. Prior to dental appointments     diltiazem (CARDIZEM CD) 180 MG 24 hr capsule TAKE 1 CAPSULE BY MOUTH EVERY DAY 90 capsule 3   famotidine (PEPCID) 20 MG tablet Take 20 mg by mouth at bedtime.     hydrocortisone (ANUSOL-HC) 2.5 % rectal cream Place 1 application rectally 2 times daily as needed 30 g 0   montelukast (SINGULAIR) 10 MG tablet TAKE 1 TABLET (10 MG TOTAL) BY MOUTH AS NEEDED. ALLERGIES 90 tablet 1   Multiple Vitamins-Minerals (MENS MULTIVITAMIN PLUS PO) Take 1 capsule by mouth daily.     Omega-3 Fatty Acids (FISH OIL) 1200 MG CAPS Take 1 capsule by mouth daily.     predniSONE (DELTASONE) 5 MG tablet TAKE 2 TABLETS (10 MG TOTAL) BY MOUTH EVERY OTHER DAY. WEAN AS DIRECTED (Patient taking differently: Take 7.5 mg by mouth every other day. Wean as directed) 100 tablet 1   tadalafil (CIALIS) 5 MG tablet Take 1-2 tablets (5-10 mg total) by mouth daily. 90 tablet 8   No current facility-administered medications on file prior to visit.    Allergies   Allergen Reactions   Ampicillin     REACTION: hives Has tolerated cephalosporins    Past Medical History:  Diagnosis Date   Allergic rhinitis due to pollen    Atrial fibrillation ([place])    A-Fib, 1987, 2003, 2005, EPS RX 2005 (A-Fib reocurred)   Basal cell carcinoma    skin CA- squamous and Basal cell   BPH (benign prostatic hypertrophy)    Cataract    Hyperlipemia    no per pt   Hypertension    no per pt   Osteoarthritis    Polymyalgia rheumatica ([place])    Squamous cell carcinoma in situ     Past Surgical History:  Procedure Laterality Date   CAPSULOTOMY Bilateral 08/2007   CATARACT EXTRACTION Bilateral    both eyes   COLONOSCOPY     MENISCUS REPAIR Left 09/2009   left knee---[facility]   NASAL SINUS SURGERY  May 2014   RETINAL TEAR REPAIR CRYOTHERAPY Right    right 12/06   RETINAL TEAR REPAIR CRYOTHERAPY Right    Retinal tear/bleed- Right 12/06   SQUAMOUS CELL CARCINOMA EXCISION Left 07/2008   left forearm   THUMB ARTHROSCOPY Left 1999    Family History  Problem Relation Age of Onset   Hypertension Father    Cirrhosis Father 68   Alcohol abuse Father    Gallstones Mother    Hyperlipidemia Mother  Esophageal cancer Brother    Asthma Daughter    Coronary artery disease Neg Hx    Diabetes Neg Hx    Cancer Neg Hx        prostate or colon   Colon cancer Neg Hx    Rectal cancer Neg Hx    Stomach cancer Neg Hx     Social History   Socioeconomic History   Marital status: Married    Spouse name: Not on file   Number of children: 3   Years of education: Not on file   Highest education level: Not on file  Occupational History   Occupation: retired Primary school teacher: RETIRED  Tobacco Use   Smoking status: Never    Passive exposure: Never   Smokeless tobacco: Never  Vaping Use   Vaping Use: Never used  Substance and Sexual Activity   Alcohol use: Yes    Comment: occ. 3 per month   Drug use: Never   Sexual activity: Yes   Other Topics Concern   Not on file  Social History Narrative   Has living will   Wife is health care POA--alternate is son [name]   Would accept resuscitation attempts   Would accept tube feeds for some time--depending on prognosis   Social Determinants of Health   Financial Resource Strain: Not on file  Food Insecurity: Not on file  Transportation Needs: Not on file  Physical Activity: Not on file  Stress: Not on file  Social Connections: Not on file  Intimate Partner Violence: Not on file   Review of Systems Appetite is off No N/V Soft stool but not liquidy     Objective:   Physical Exam Constitutional:      Appearance: Normal appearance.     Comments: Coarse cough  HENT:     Head:     Comments: Left frontal and bilateral maxillary tenderness    Right Ear: Tympanic membrane and ear canal normal.     Left Ear: Tympanic membrane and ear canal normal.     Nose: Congestion present.     Mouth/Throat:     Pharynx: No oropharyngeal exudate or posterior oropharyngeal erythema.  Pulmonary:     Effort: Pulmonary effort is normal.     Comments: Mild expiratory phase prolongation Expiratory rhonchi LLL fine crackles Abdominal:     Comments: No hernias  Musculoskeletal:     Cervical back: Neck supple.  Lymphadenopathy:     Cervical: No cervical adenopathy.  Neurological:     Mental Status: He is alert.            Assessment & Plan:

## 2022-03-30 NOTE — Assessment & Plan Note (Addendum)
Has clear sinus symptoms--but also asthmatic process or pneumonia Will check CXR Does have some increased markings ---it looks bilateral Will try doxycycline--but if worsens, would go ahead with levaquin Prednisone '40mg'$  daily for 3 days for the bronchospasm--then wean back to his usual '6mg'$  (daily until symptoms gone, then every other day)

## 2022-03-30 NOTE — Patient Instructions (Signed)
Take '40mg'$  of the prednisone daily for 3 days, then '6mg'$  daily till you feel back to normal. Then you can resume the '6mg'$  every other day

## 2022-04-11 ENCOUNTER — Other Ambulatory Visit: Payer: Self-pay | Admitting: Internal Medicine

## 2022-04-12 DIAGNOSIS — Z872 Personal history of diseases of the skin and subcutaneous tissue: Secondary | ICD-10-CM | POA: Diagnosis not present

## 2022-04-12 DIAGNOSIS — Z859 Personal history of malignant neoplasm, unspecified: Secondary | ICD-10-CM | POA: Diagnosis not present

## 2022-04-12 DIAGNOSIS — L578 Other skin changes due to chronic exposure to nonionizing radiation: Secondary | ICD-10-CM | POA: Diagnosis not present

## 2022-04-12 DIAGNOSIS — L570 Actinic keratosis: Secondary | ICD-10-CM | POA: Diagnosis not present

## 2022-04-12 NOTE — Telephone Encounter (Signed)
Prescription refill request for Eliquis received. Indication:afib Last office visit:6/23 Scr:0.8 Age: 72 Weight:82.1  kg  Prescription refilled

## 2022-04-13 DIAGNOSIS — M5136 Other intervertebral disc degeneration, lumbar region: Secondary | ICD-10-CM | POA: Diagnosis not present

## 2022-04-13 DIAGNOSIS — M62830 Muscle spasm of back: Secondary | ICD-10-CM | POA: Diagnosis not present

## 2022-04-13 DIAGNOSIS — M9902 Segmental and somatic dysfunction of thoracic region: Secondary | ICD-10-CM | POA: Diagnosis not present

## 2022-04-13 DIAGNOSIS — M9903 Segmental and somatic dysfunction of lumbar region: Secondary | ICD-10-CM | POA: Diagnosis not present

## 2022-04-22 DIAGNOSIS — H903 Sensorineural hearing loss, bilateral: Secondary | ICD-10-CM | POA: Diagnosis not present

## 2022-05-04 DIAGNOSIS — C44629 Squamous cell carcinoma of skin of left upper limb, including shoulder: Secondary | ICD-10-CM | POA: Diagnosis not present

## 2022-05-04 DIAGNOSIS — L905 Scar conditions and fibrosis of skin: Secondary | ICD-10-CM | POA: Diagnosis not present

## 2022-05-06 ENCOUNTER — Other Ambulatory Visit: Payer: Self-pay | Admitting: Internal Medicine

## 2022-05-12 DIAGNOSIS — M9902 Segmental and somatic dysfunction of thoracic region: Secondary | ICD-10-CM | POA: Diagnosis not present

## 2022-05-12 DIAGNOSIS — M9903 Segmental and somatic dysfunction of lumbar region: Secondary | ICD-10-CM | POA: Diagnosis not present

## 2022-05-12 DIAGNOSIS — M5136 Other intervertebral disc degeneration, lumbar region: Secondary | ICD-10-CM | POA: Diagnosis not present

## 2022-05-12 DIAGNOSIS — M62830 Muscle spasm of back: Secondary | ICD-10-CM | POA: Diagnosis not present

## 2022-06-09 DIAGNOSIS — M62830 Muscle spasm of back: Secondary | ICD-10-CM | POA: Diagnosis not present

## 2022-06-09 DIAGNOSIS — M9902 Segmental and somatic dysfunction of thoracic region: Secondary | ICD-10-CM | POA: Diagnosis not present

## 2022-06-09 DIAGNOSIS — M9903 Segmental and somatic dysfunction of lumbar region: Secondary | ICD-10-CM | POA: Diagnosis not present

## 2022-06-09 DIAGNOSIS — M5136 Other intervertebral disc degeneration, lumbar region: Secondary | ICD-10-CM | POA: Diagnosis not present

## 2022-06-10 ENCOUNTER — Encounter: Payer: Self-pay | Admitting: Radiology

## 2022-07-01 DIAGNOSIS — H35372 Puckering of macula, left eye: Secondary | ICD-10-CM | POA: Diagnosis not present

## 2022-07-01 DIAGNOSIS — H40013 Open angle with borderline findings, low risk, bilateral: Secondary | ICD-10-CM | POA: Diagnosis not present

## 2022-07-01 DIAGNOSIS — Z961 Presence of intraocular lens: Secondary | ICD-10-CM | POA: Diagnosis not present

## 2022-07-07 DIAGNOSIS — M5136 Other intervertebral disc degeneration, lumbar region: Secondary | ICD-10-CM | POA: Diagnosis not present

## 2022-07-07 DIAGNOSIS — M9903 Segmental and somatic dysfunction of lumbar region: Secondary | ICD-10-CM | POA: Diagnosis not present

## 2022-07-07 DIAGNOSIS — M62830 Muscle spasm of back: Secondary | ICD-10-CM | POA: Diagnosis not present

## 2022-07-07 DIAGNOSIS — M9902 Segmental and somatic dysfunction of thoracic region: Secondary | ICD-10-CM | POA: Diagnosis not present

## 2022-08-04 DIAGNOSIS — M62830 Muscle spasm of back: Secondary | ICD-10-CM | POA: Diagnosis not present

## 2022-08-04 DIAGNOSIS — M9902 Segmental and somatic dysfunction of thoracic region: Secondary | ICD-10-CM | POA: Diagnosis not present

## 2022-08-04 DIAGNOSIS — M9903 Segmental and somatic dysfunction of lumbar region: Secondary | ICD-10-CM | POA: Diagnosis not present

## 2022-08-04 DIAGNOSIS — M5136 Other intervertebral disc degeneration, lumbar region: Secondary | ICD-10-CM | POA: Diagnosis not present

## 2022-08-05 ENCOUNTER — Emergency Department: Payer: Medicare Other

## 2022-08-05 ENCOUNTER — Ambulatory Visit (INDEPENDENT_AMBULATORY_CARE_PROVIDER_SITE_OTHER): Payer: Medicare Other | Admitting: Internal Medicine

## 2022-08-05 ENCOUNTER — Emergency Department
Admission: EM | Admit: 2022-08-05 | Discharge: 2022-08-05 | Disposition: A | Discharge status: Home / Self Care | Payer: Medicare Other | Attending: Emergency Medicine | Admitting: Emergency Medicine

## 2022-08-05 ENCOUNTER — Encounter: Payer: Self-pay | Admitting: Internal Medicine

## 2022-08-05 VITALS — BP 128/76 | HR 68 | Temp 97.9°F | Ht 68.0 in | Wt 184.0 lb

## 2022-08-05 DIAGNOSIS — M542 Cervicalgia: Secondary | ICD-10-CM | POA: Insufficient documentation

## 2022-08-05 DIAGNOSIS — S139XXA Sprain of joints and ligaments of unspecified parts of neck, initial encounter: Secondary | ICD-10-CM | POA: Insufficient documentation

## 2022-08-05 DIAGNOSIS — I10 Essential (primary) hypertension: Secondary | ICD-10-CM | POA: Insufficient documentation

## 2022-08-05 DIAGNOSIS — Z7901 Long term (current) use of anticoagulants: Secondary | ICD-10-CM | POA: Diagnosis not present

## 2022-08-05 DIAGNOSIS — I4891 Unspecified atrial fibrillation: Secondary | ICD-10-CM | POA: Insufficient documentation

## 2022-08-05 DIAGNOSIS — R519 Headache, unspecified: Secondary | ICD-10-CM

## 2022-08-05 LAB — CBC WITH DIFFERENTIAL/PLATELET
Abs Immature Granulocytes: 0.02 10*3/uL (ref 0.00–0.07)
Basophils Absolute: 0.1 10*3/uL (ref 0.0–0.1)
Basophils Relative: 1 %
Eosinophils Absolute: 0.2 10*3/uL (ref 0.0–0.5)
Eosinophils Relative: 3 %
HCT: 43.8 % (ref 39.0–52.0)
Hemoglobin: 14.6 g/dL (ref 13.0–17.0)
Immature Granulocytes: 0 %
Lymphocytes Relative: 26 %
Lymphs Abs: 1.7 10*3/uL (ref 0.7–4.0)
MCH: 31.7 pg (ref 26.0–34.0)
MCHC: 33.3 g/dL (ref 30.0–36.0)
MCV: 95 fL (ref 80.0–100.0)
Monocytes Absolute: 0.8 10*3/uL (ref 0.1–1.0)
Monocytes Relative: 13 %
Neutro Abs: 3.8 10*3/uL (ref 1.7–7.7)
Neutrophils Relative %: 57 %
Platelets: 239 10*3/uL (ref 150–400)
RBC: 4.61 MIL/uL (ref 4.22–5.81)
RDW: 13 % (ref 11.5–15.5)
WBC: 6.5 10*3/uL (ref 4.0–10.5)
nRBC: 0 % (ref 0.0–0.2)

## 2022-08-05 LAB — COMPREHENSIVE METABOLIC PANEL
ALT: 16 U/L (ref 0–44)
AST: 19 U/L (ref 15–41)
Albumin: 4.3 g/dL (ref 3.5–5.0)
Alkaline Phosphatase: 48 U/L (ref 38–126)
Anion gap: 7 (ref 5–15)
BUN: 18 mg/dL (ref 8–23)
CO2: 27 mmol/L (ref 22–32)
Calcium: 9.8 mg/dL (ref 8.9–10.3)
Chloride: 103 mmol/L (ref 98–111)
Creatinine, Ser: 0.77 mg/dL (ref 0.61–1.24)
GFR, Estimated: >60 mL/min (ref >60)
Glucose, Bld: 94 mg/dL (ref 70–99)
Potassium: 4.2 mmol/L (ref 3.5–5.1)
Sodium: 137 mmol/L (ref 135–145)
Total Bilirubin: 0.8 mg/dL (ref 0.3–1.2)
Total Protein: 6.9 g/dL (ref 6.5–8.1)

## 2022-08-05 LAB — C-REACTIVE PROTEIN: CRP: 0.5 mg/dL (ref <1.0)

## 2022-08-05 LAB — SEDIMENTATION RATE: Sed Rate: 6 mm/hr (ref 0–20)

## 2022-08-05 MED ORDER — CYCLOBENZAPRINE HCL 5 MG PO TABS: 5.0000 mg | ORAL_TABLET | Freq: Three times a day (TID) | ORAL | 0 refills | Status: AC | PRN

## 2022-08-05 NOTE — ED Provider Notes (Signed)
[facility] Provider Note    Event Date/Time   First MD Initiated Contact with Patient 08/05/22 1805     (approximate)   History   Headache   HPI  Rodney Gray is a 72 y.o. male past medical history significant for polymyalgia rheumatica, hypertension, atrial fibrillation on Eliquis, who presents to the emergency department for headache.  States that he has had muscle pain to the right side of his neck and shoulder for the past 2 to 3 days.  Evaluated at his primary care physician office today and told that he thought he had a muscle strain from his yard work and heavy lifting.  Developed a headache with sharp stabbing pain to the top of his scalp on the right side after he left.  He was concerned that he could be having problems with a vasculitis.  Denies any falls or trauma recently.  Denies any nausea, vomiting.  No change in vision.  No jaw claudication.  Denies any chest pain or shortness of breath.  No extremity numbness or weakness.  No change in speech or trouble with swallowing.  No changes to his home medications.     Physical Exam   Triage Vital Signs: ED Triage Vitals  Enc Vitals Group     BP 08/05/22 1734 (!) 163/102     Pulse Rate 08/05/22 1734 79     Resp 08/05/22 1734 18     Temp 08/05/22 1734 97.7 F (36.5 C)     Temp Source 08/05/22 1734 Oral     SpO2 08/05/22 1734 97 %     Weight --      Height --      Head Circumference --      Peak Flow --      Pain Score 08/05/22 1735 8     Pain Loc --      Pain Edu? --      Excl. in GC? --     Most recent vital signs: Vitals:   08/05/22 1734  BP: (!) 163/102  Pulse: 79  Resp: 18  Temp: 97.7 F (36.5 C)  SpO2: 97%    Physical Exam Constitutional:      Appearance: He is well-developed.  HENT:     Head: Atraumatic.  Eyes:     Conjunctiva/sclera: Conjunctivae normal.     Pupils: Pupils are equal, round, and reactive to light.     Comments: No afferent pupillary defect  noted  Cardiovascular:     Rate and Rhythm: Regular rhythm.  Pulmonary:     Effort: No respiratory distress.  Musculoskeletal:        General: Normal range of motion.     Cervical back: Normal range of motion.     Comments: Mild tenderness to palpation to the right sternocleidomastoid.  No midline cervical spine tenderness to palpation.  No tenderness to the bilateral temporal arteries.  Extraocular movements are intact.  Skin:    General: Skin is warm.  Neurological:     Mental Status: He is alert. Mental status is at baseline.     GCS: GCS eye subscore is 4. GCS verbal subscore is 5. GCS motor subscore is 6.     Cranial Nerves: Cranial nerves 2-12 are intact.     Sensory: Sensation is intact.     Motor: Motor function is intact.     Coordination: Coordination is intact.     Gait: Gait is intact.     IMPRESSION / MDM /  ASSESSMENT AND PLAN / ED COURSE  I reviewed the triage vital signs and the nursing notes.  Differential diagnosis including giant cell arteritis, migraine headache, trigeminal neuralgia, intracranial hemorrhage.  Patient has a nonfocal neurologic exam, clinical picture is not consistent with a CVA.  No falls or trauma.  No carotid bruit.  EKG  RADIOLOGY I independently reviewed imaging, my interpretation of imaging: CT scan of the head without signs of intracranial hemorrhage or infarction.  Read as no acute findings.  Chronic sinusitis.  LABS (all labs ordered are listed, but only abnormal results are displayed) Labs interpreted as -    Labs Reviewed  COMPREHENSIVE METABOLIC PANEL  CBC WITH DIFFERENTIAL/PLATELET  SEDIMENTATION RATE  C-REACTIVE PROTEIN     MDM    No significant leukocytosis.  Creatinine is at baseline.  Normal ESR.  CRP is currently in process.  Given that the patient has no change in vision, no jaw claudication, no tenderness over his temporal artery low suspicion for giant cell arteritis.  Possible musculoskeletal strain.  Possible  trigeminal neuralgia.  Discussed scheduling Tylenol.  Discussed muscle relaxer and Lidoderm patches.  Discussed close follow-up with primary care physician and return precautions for any worsening symptoms.  Clinical picture is not consistent with a meningitis.   PROCEDURES:  Critical Care performed: No  Procedures  Patient's presentation is most consistent with acute presentation with potential threat to life or bodily function.   MEDICATIONS ORDERED IN ED: Medications - No data to display  FINAL CLINICAL IMPRESSION(S) / ED DIAGNOSES   Final diagnoses:  Headache disorder     Rx / DC Orders   ED Discharge Orders          Ordered    cyclobenzaprine (FLEXERIL) 5 MG tablet  3 times daily PRN        08/05/22 1928             Note:  This document was prepared using Dragon voice recognition software and may include unintentional dictation errors.   Corena Herter, MD 08/05/22 Barry Brunner

## 2022-08-05 NOTE — ED Triage Notes (Signed)
Pt presents to the ED due to headache. Pt was seen at his primary doctor due to shoulder and neck pain that radiates to his head. Pt states his pcp states it was a muscle strain. Pt states the pain is happening too frequently. Pt A&Ox4

## 2022-08-05 NOTE — Assessment & Plan Note (Signed)
Seems to be injury in SCM on that side History not consistent with TA Discussed heat Tylenol did help Doesn't want muscle relaxer Could take NSAID (aleve) if really bad

## 2022-08-05 NOTE — Discharge Instructions (Signed)
You are seen in the emergency department for headache and right-sided neck pain.  Concern that you have a muscle strain.  Your CT scan of your head did not show any bleeding and no signs of a stroke.  Did have findings of chronic sinusitis.  Your lab work was normal today.  You had a normal ESR and I have a low suspicion that this is giant cell arteritis.  You can take 650 mg of Tylenol every 4 hours or 1000 mg of Tylenol every 6 hours.  This will not exceed your max dose of 4000 mg.  You can apply heat.  Use Lidoderm patches as needed, remove after 12 hours.  You can get these over-the-counter for 4% lidocaine.  Cyclbenzoprine (Flexeril) - You can take 5 mg every 8 hours as needed for muscle strain/spasm.  Do not drive or work on this medication.  This medication can cause you to feel tired.

## 2022-08-05 NOTE — Progress Notes (Signed)
Subjective:    Patient ID: Rodney Gray, male    DOB: 28-Aug-1950, 72 y.o.   MRN: [id number]  HPI Here due to neck pain--with wife  Flying to [place] next week--wants to make sure nothing bad going on  3-4 days ago--noticed pain along right side of neck---and up neck to ear Pain up past the ear and up to temple Ear doesn't feel right Lots of outdoor work--but doesn't remember any injury Has been doing some arm/shoulder weight work also Noticed it first upon awakening  Current Outpatient Medications on File Prior to Visit  Medication Sig Dispense Refill   clindamycin (CLEOCIN) 150 MG capsule Take 4 capsules by mouth as needed. Prior to dental appointments     diltiazem (CARDIZEM CD) 180 MG 24 hr capsule TAKE 1 CAPSULE BY MOUTH EVERY DAY 90 capsule 3   ELIQUIS 5 MG TABS tablet TAKE 1 TABLET BY MOUTH TWICE  DAILY 180 tablet 3   famotidine (PEPCID) 20 MG tablet Take 20 mg by mouth at bedtime.     hydrocortisone (ANUSOL-HC) 2.5 % rectal cream Place 1 application rectally 2 times daily as needed 30 g 0   montelukast (SINGULAIR) 10 MG tablet TAKE 1 TABLET (10 MG TOTAL) BY MOUTH AS NEEDED. ALLERGIES 90 tablet 1   Multiple Vitamins-Minerals (MENS MULTIVITAMIN PLUS PO) Take 1 capsule by mouth daily.     Omega-3 Fatty Acids (FISH OIL) 1200 MG CAPS Take 1 capsule by mouth daily.     predniSONE (DELTASONE) 5 MG tablet Take 1-2 tablets (5-10 mg total) by mouth every other day. Wean as directed 60 tablet 11   tadalafil (CIALIS) 5 MG tablet Take 1-2 tablets (5-10 mg total) by mouth daily. 90 tablet 8   No current facility-administered medications on file prior to visit.    Allergies  Allergen Reactions   Ampicillin     REACTION: hives Has tolerated cephalosporins    Past Medical History:  Diagnosis Date   Allergic rhinitis due to pollen    Atrial fibrillation ([facility])    A-Fib, 1987, 2003, 2005, EPS RX 2005 (A-Fib reocurred)   Basal cell carcinoma    skin CA- squamous and Basal cell    BPH (benign prostatic hypertrophy)    Cataract    Hyperlipemia    no per pt   Hypertension    no per pt   Osteoarthritis    Polymyalgia rheumatica ([facility])    Squamous cell carcinoma in situ     Past Surgical History:  Procedure Laterality Date   CAPSULOTOMY Bilateral 08/2007   CATARACT EXTRACTION Bilateral    both eyes   COLONOSCOPY     MENISCUS REPAIR Left 09/2009   left knee---Dr [facility]   NASAL SINUS SURGERY  May 2014   RETINAL TEAR REPAIR CRYOTHERAPY Right    right 12/06   RETINAL TEAR REPAIR CRYOTHERAPY Right    Retinal tear/bleed- Right 12/06   SQUAMOUS CELL CARCINOMA EXCISION Left 07/2008   left forearm   THUMB ARTHROSCOPY Left 1999    Family History  Problem Relation Age of Onset   Hypertension Father    Cirrhosis Father 32   Alcohol abuse Father    Gallstones Mother    Hyperlipidemia Mother    Esophageal cancer Brother    Asthma Daughter    Coronary artery disease Neg Hx    Diabetes Neg Hx    Cancer Neg Hx        prostate or colon   Colon cancer Neg Hx  Rectal cancer Neg Hx    Stomach cancer Neg Hx     Social History   Socioeconomic History   Marital status: Married    Spouse name: Not on file   Number of children: 3   Years of education: Not on file   Highest education level: Not on file  Occupational History   Occupation: retired Mudlogger: RETIRED  Tobacco Use   Smoking status: Never    Passive exposure: Never   Smokeless tobacco: Never  Vaping Use   Vaping Use: Never used  Substance and Sexual Activity   Alcohol use: Yes    Comment: occ. 3 per month   Drug use: Never   Sexual activity: Yes  Other Topics Concern   Not on file  Social History Narrative   Has living will   Wife is health care POA--alternate is son Arlys John   Would accept resuscitation attempts   Would accept tube feeds for some time--depending on prognosis   Social Determinants of Health   Financial Resource Strain: Not on file  Food  Insecurity: Not on file  Transportation Needs: Not on file  Physical Activity: Not on file  Stress: Not on file  Social Connections: Not on file  Intimate Partner Violence: Not on file   Review of Systems No headache No vision loss No jaw claudication    Objective:   Physical Exam HENT:     Head:     Comments: No temporal bruit    Right Ear: Tympanic membrane and ear canal normal.  Neck:     Comments: Tenderness around right SCM Mild reduced neck flexion--otherwise ROM is okay Lymphadenopathy:     Cervical: No cervical adenopathy.            Assessment & Plan:

## 2022-08-06 ENCOUNTER — Encounter: Payer: Self-pay | Admitting: Internal Medicine

## 2022-08-09 ENCOUNTER — Encounter: Payer: Self-pay | Admitting: Internal Medicine

## 2022-08-09 ENCOUNTER — Ambulatory Visit (INDEPENDENT_AMBULATORY_CARE_PROVIDER_SITE_OTHER): Payer: Medicare Other | Admitting: Internal Medicine

## 2022-08-09 VITALS — BP 130/86 | HR 86 | Temp 98.7°F | Ht 68.0 in | Wt 183.0 lb

## 2022-08-09 DIAGNOSIS — R1010 Upper abdominal pain, unspecified: Secondary | ICD-10-CM

## 2022-08-09 DIAGNOSIS — R1013 Epigastric pain: Secondary | ICD-10-CM | POA: Insufficient documentation

## 2022-08-09 DIAGNOSIS — R109 Unspecified abdominal pain: Secondary | ICD-10-CM | POA: Insufficient documentation

## 2022-08-09 NOTE — Progress Notes (Signed)
Subjective:    Patient ID: Rodney Gray, male    DOB: 04-02-1951, 72 y.o.   MRN: [id number]  HPI Here with wife for ER follow up  After visit here---had ongoing headache So went to ER----head CT and all blood work reassuring Headache is gone now  Got confused---he thought I had called his house and sent in an antibiotic (I had actually sent an email)  Wife had cataract surgery 5/3 They went out to breakfast after---he had bacon, cheese, egg croissant with sliced tomatoes and mayo Got sick later that night Vomited once Keeping up with fluids--water with gatorade powder  Epigastric pain  No diarrhea  No fever  States "you sent in a prescription because someone else had food poisoning"---then said it was a "sample" from the pharmacy  Current Outpatient Medications on File Prior to Visit  Medication Sig Dispense Refill   clindamycin (CLEOCIN) 150 MG capsule Take 4 capsules by mouth as needed. Prior to dental appointments     cyclobenzaprine (FLEXERIL) 5 MG tablet Take 1 tablet (5 mg total) by mouth 3 (three) times daily as needed for up to 7 days for muscle spasms. 15 tablet 0   diltiazem (CARDIZEM CD) 180 MG 24 hr capsule TAKE 1 CAPSULE BY MOUTH EVERY DAY 90 capsule 3   ELIQUIS 5 MG TABS tablet TAKE 1 TABLET BY MOUTH TWICE  DAILY 180 tablet 3   famotidine (PEPCID) 20 MG tablet Take 20 mg by mouth at bedtime.     hydrocortisone (ANUSOL-HC) 2.5 % rectal cream Place 1 application rectally 2 times daily as needed 30 g 0   montelukast (SINGULAIR) 10 MG tablet TAKE 1 TABLET (10 MG TOTAL) BY MOUTH AS NEEDED. ALLERGIES 90 tablet 1   Multiple Vitamins-Minerals (MENS MULTIVITAMIN PLUS PO) Take 1 capsule by mouth daily.     Omega-3 Fatty Acids (FISH OIL) 1200 MG CAPS Take 1 capsule by mouth daily.     predniSONE (DELTASONE) 5 MG tablet Take 1-2 tablets (5-10 mg total) by mouth every other day. Wean as directed 60 tablet 11   tadalafil (CIALIS) 5 MG tablet Take 1-2 tablets (5-10 mg  total) by mouth daily. 90 tablet 8   No current facility-administered medications on file prior to visit.    Allergies  Allergen Reactions   Ampicillin     REACTION: hives Has tolerated cephalosporins    Past Medical History:  Diagnosis Date   Allergic rhinitis due to pollen    Atrial fibrillation (HCC)    A-Fib, 1987, 2003, 2005, EPS RX 2005 (A-Fib reocurred)   Basal cell carcinoma    skin CA- squamous and Basal cell   BPH (benign prostatic hypertrophy)    Cataract    Hyperlipemia    no per pt   Hypertension    no per pt   Osteoarthritis    Polymyalgia rheumatica (HCC)    Squamous cell carcinoma in situ     Past Surgical History:  Procedure Laterality Date   CAPSULOTOMY Bilateral 08/2007   CATARACT EXTRACTION Bilateral    both eyes   COLONOSCOPY     MENISCUS REPAIR Left 09/2009   left knee---Dr [facility]   NASAL SINUS SURGERY  May 2014   RETINAL TEAR REPAIR CRYOTHERAPY Right    right 12/06   RETINAL TEAR REPAIR CRYOTHERAPY Right    Retinal tear/bleed- Right 12/06   SQUAMOUS CELL CARCINOMA EXCISION Left 07/2008   left forearm   THUMB ARTHROSCOPY Left 1999    Family History  Problem Relation Age of Onset   Hypertension Father    Cirrhosis Father 58   Alcohol abuse Father    Gallstones Mother    Hyperlipidemia Mother    Esophageal cancer Brother    Asthma Daughter    Coronary artery disease Neg Hx    Diabetes Neg Hx    Cancer Neg Hx        prostate or colon   Colon cancer Neg Hx    Rectal cancer Neg Hx    Stomach cancer Neg Hx     Social History   Socioeconomic History   Marital status: Married    Spouse name: Not on file   Number of children: 3   Years of education: Not on file   Highest education level: Not on file  Occupational History   Occupation: retired Mudlogger: RETIRED  Tobacco Use   Smoking status: Never    Passive exposure: Never   Smokeless tobacco: Never  Vaping Use   Vaping Use: Never used   Substance and Sexual Activity   Alcohol use: Yes    Comment: occ. 3 per month   Drug use: Never   Sexual activity: Yes  Other Topics Concern   Not on file  Social History Narrative   Has living will   Wife is health care POA--alternate is son Arlys John   Would accept resuscitation attempts   Would accept tube feeds for some time--depending on prognosis   Social Determinants of Health   Financial Resource Strain: Not on file  Food Insecurity: Not on file  Transportation Needs: Not on file  Physical Activity: Not on file  Stress: Not on file  Social Connections: Not on file  Intimate Partner Violence: Not on file   Review of Systems No other confusion Some pain in backs of legs Has coating on tongue Some heartburn issues--uses the medication prn    Objective:   Physical Exam Constitutional:      Appearance: He is well-developed.  Cardiovascular:     Rate and Rhythm: Normal rate and regular rhythm.     Heart sounds: No murmur heard.    No gallop.  Pulmonary:     Effort: Pulmonary effort is normal.     Breath sounds: Normal breath sounds. No wheezing or rales.  Abdominal:     General: Abdomen is scaphoid. There is no distension.     Palpations: Abdomen is soft.     Tenderness: There is no abdominal tenderness. There is no guarding.  Musculoskeletal:     Cervical back: Neck supple.     Right lower leg: No edema.     Left lower leg: No edema.  Lymphadenopathy:     Cervical: No cervical adenopathy.  Neurological:     Mental Status: He is alert.            Assessment & Plan:

## 2022-08-09 NOTE — Assessment & Plan Note (Signed)
Not typical for food poisoning but possible Not consistent with cholecystitis or pancreatitis ?gastritis Advised nutritional support (can try ensure/boost till appetite is better) Use the famotidine 20 bid for now---till he feels back to normal (then nightly)

## 2022-08-16 DIAGNOSIS — H9201 Otalgia, right ear: Secondary | ICD-10-CM | POA: Diagnosis not present

## 2022-08-16 DIAGNOSIS — M542 Cervicalgia: Secondary | ICD-10-CM | POA: Diagnosis not present

## 2022-08-20 DIAGNOSIS — R293 Abnormal posture: Secondary | ICD-10-CM | POA: Diagnosis not present

## 2022-08-20 DIAGNOSIS — M542 Cervicalgia: Secondary | ICD-10-CM | POA: Diagnosis not present

## 2022-08-24 ENCOUNTER — Ambulatory Visit (INDEPENDENT_AMBULATORY_CARE_PROVIDER_SITE_OTHER): Payer: Medicare Other | Admitting: Internal Medicine

## 2022-08-24 ENCOUNTER — Encounter: Payer: Self-pay | Admitting: Internal Medicine

## 2022-08-24 VITALS — BP 124/74 | HR 68 | Temp 97.9°F | Ht 68.0 in | Wt 179.0 lb

## 2022-08-24 DIAGNOSIS — R1013 Epigastric pain: Secondary | ICD-10-CM | POA: Diagnosis not present

## 2022-08-24 MED ORDER — OMEPRAZOLE 20 MG PO CPDR: 20.0000 mg | DELAYED_RELEASE_CAPSULE | Freq: Every day | ORAL | 3 refills | Status: DC

## 2022-08-24 NOTE — Progress Notes (Signed)
Subjective:    Patient ID: Rodney Gray, male    DOB: Sep 27, 1950, 72 y.o.   MRN: [id number]  HPI Here with wife again due to ongoing abdominal symptoms  "He is struggling" --per wife Gets tired with minimal exertion Working with PT for neck--but too tired to do his own exercises  Appetite is off No snacking Has lost 4# since the last time Has been taking famotidine--but still has epigastric discomfort---or feeling of fullness Some stitch on the left side Mostly related to eating or after eating--but largely continuous Not really exertional--but not doing much  Worries about family history--mom had MI after epigastric pain Brother with esophageal cancer  Did try off tadalafil---didn't make any difference  Current Outpatient Medications on File Prior to Visit  Medication Sig Dispense Refill   clindamycin (CLEOCIN) 150 MG capsule Take 4 capsules by mouth as needed. Prior to dental appointments     diltiazem (CARDIZEM CD) 180 MG 24 hr capsule TAKE 1 CAPSULE BY MOUTH EVERY DAY 90 capsule 3   ELIQUIS 5 MG TABS tablet TAKE 1 TABLET BY MOUTH TWICE  DAILY 180 tablet 3   famotidine (PEPCID) 20 MG tablet Take 20 mg by mouth at bedtime.     hydrocortisone (ANUSOL-HC) 2.5 % rectal cream Place 1 application rectally 2 times daily as needed 30 g 0   montelukast (SINGULAIR) 10 MG tablet TAKE 1 TABLET (10 MG TOTAL) BY MOUTH AS NEEDED. ALLERGIES 90 tablet 1   Multiple Vitamins-Minerals (MENS MULTIVITAMIN PLUS PO) Take 1 capsule by mouth daily.     Omega-3 Fatty Acids (FISH OIL) 1200 MG CAPS Take 1 capsule by mouth daily.     predniSONE (DELTASONE) 5 MG tablet Take 1-2 tablets (5-10 mg total) by mouth every other day. Wean as directed (Patient taking differently: Take 5-10 mg by mouth every other day. Wean as directed. 6mg  every other day) 60 tablet 11   tadalafil (CIALIS) 5 MG tablet Take 1-2 tablets (5-10 mg total) by mouth daily. 90 tablet 8   No current facility-administered  medications on file prior to visit.    Allergies  Allergen Reactions   Ampicillin     REACTION: hives Has tolerated cephalosporins    Past Medical History:  Diagnosis Date   Allergic rhinitis due to pollen    Atrial fibrillation (HCC)    A-Fib, 1987, 2003, 2005, EPS RX 2005 (A-Fib reocurred)   Basal cell carcinoma    skin CA- squamous and Basal cell   BPH (benign prostatic hypertrophy)    Cataract    Hyperlipemia    no per pt   Hypertension    no per pt   Osteoarthritis    Polymyalgia rheumatica (HCC)    Squamous cell carcinoma in situ     Past Surgical History:  Procedure Laterality Date   CAPSULOTOMY Bilateral 08/2007   CATARACT EXTRACTION Bilateral    both eyes   COLONOSCOPY     MENISCUS REPAIR Left 09/2009   left knee---Dr [facility]   NASAL SINUS SURGERY  May 2014   RETINAL TEAR REPAIR CRYOTHERAPY Right    right 12/06   RETINAL TEAR REPAIR CRYOTHERAPY Right    Retinal tear/bleed- Right 12/06   SQUAMOUS CELL CARCINOMA EXCISION Left 07/2008   left forearm   THUMB ARTHROSCOPY Left 1999    Family History  Problem Relation Age of Onset   Hypertension Father    Cirrhosis Father 32   Alcohol abuse Father    Gallstones Mother  Hyperlipidemia Mother    Esophageal cancer Brother    Asthma Daughter    Coronary artery disease Neg Hx    Diabetes Neg Hx    Cancer Neg Hx        prostate or colon   Colon cancer Neg Hx    Rectal cancer Neg Hx    Stomach cancer Neg Hx     Social History   Socioeconomic History   Marital status: Married    Spouse name: Not on file   Number of children: 3   Years of education: Not on file   Highest education level: Not on file  Occupational History   Occupation: retired Mudlogger: RETIRED  Tobacco Use   Smoking status: Never    Passive exposure: Never   Smokeless tobacco: Never  Vaping Use   Vaping Use: Never used  Substance and Sexual Activity   Alcohol use: Yes    Comment: occ. 3 per  month   Drug use: Never   Sexual activity: Yes  Other Topics Concern   Not on file  Social History Narrative   Has living will   Wife is health care POA--alternate is son Arlys John   Would accept resuscitation attempts   Would accept tube feeds for some time--depending on prognosis   Social Determinants of Health   Financial Resource Strain: Not on file  Food Insecurity: Not on file  Transportation Needs: Not on file  Physical Activity: Not on file  Stress: Not on file  Social Connections: Not on file  Intimate Partner Violence: Not on file   Review of Systems Slight nausea at times---no vomiting Some "sticky' stools---and will have gas and slight stool when sitting to void No more confusion Feels cold all the time Still some headaches---but mostly pain along right lateral neck (that he is getting PT for)    Objective:   Physical Exam Constitutional:      Appearance: Normal appearance.  Cardiovascular:     Rate and Rhythm: Normal rate and regular rhythm.     Heart sounds: No murmur heard.    No gallop.  Pulmonary:     Effort: Pulmonary effort is normal.     Breath sounds: Normal breath sounds. No wheezing or rales.  Abdominal:     General: Bowel sounds are normal.     Palpations: Abdomen is soft. There is no mass.     Tenderness: There is no guarding.     Hernia: No hernia is present.     Comments: Slight epigastric tenderness  Musculoskeletal:     Cervical back: Neck supple.     Right lower leg: No edema.     Left lower leg: No edema.  Lymphadenopathy:     Cervical: No cervical adenopathy.  Neurological:     Mental Status: He is alert.            Assessment & Plan:

## 2022-08-24 NOTE — Addendum Note (Signed)
Addended by: Alvina Chou on: 08/24/2022 08:19 AM   Modules accepted: Orders

## 2022-08-24 NOTE — Assessment & Plan Note (Signed)
Worrisome features of malaise and weight loss  Will need GI consultation for EGD unless things get better (I will switch to prilosec) Abd/pelvic CT  Check for H pylori

## 2022-08-25 ENCOUNTER — Other Ambulatory Visit: Payer: Self-pay | Admitting: Radiology

## 2022-08-25 ENCOUNTER — Encounter: Payer: Self-pay | Admitting: *Deleted

## 2022-08-25 DIAGNOSIS — R1013 Epigastric pain: Secondary | ICD-10-CM

## 2022-08-26 DIAGNOSIS — R293 Abnormal posture: Secondary | ICD-10-CM | POA: Diagnosis not present

## 2022-08-26 DIAGNOSIS — M542 Cervicalgia: Secondary | ICD-10-CM | POA: Diagnosis not present

## 2022-08-27 LAB — H. PYLORI ANTIGEN, STOOL: H pylori Ag, Stl: NEGATIVE

## 2022-08-27 LAB — SPECIMEN STATUS REPORT

## 2022-09-01 ENCOUNTER — Ambulatory Visit
Admission: RE | Admit: 2022-09-01 | Discharge: 2022-09-01 | Disposition: A | Discharge status: Home / Self Care | Payer: Medicare Other | Source: Ambulatory Visit | Attending: Internal Medicine | Admitting: Internal Medicine

## 2022-09-01 DIAGNOSIS — I700 Atherosclerosis of aorta: Secondary | ICD-10-CM | POA: Diagnosis not present

## 2022-09-01 DIAGNOSIS — M5136 Other intervertebral disc degeneration, lumbar region: Secondary | ICD-10-CM | POA: Diagnosis not present

## 2022-09-01 DIAGNOSIS — M9903 Segmental and somatic dysfunction of lumbar region: Secondary | ICD-10-CM | POA: Diagnosis not present

## 2022-09-01 DIAGNOSIS — M62830 Muscle spasm of back: Secondary | ICD-10-CM | POA: Diagnosis not present

## 2022-09-01 DIAGNOSIS — R1013 Epigastric pain: Secondary | ICD-10-CM | POA: Diagnosis not present

## 2022-09-01 DIAGNOSIS — M9902 Segmental and somatic dysfunction of thoracic region: Secondary | ICD-10-CM | POA: Diagnosis not present

## 2022-09-01 DIAGNOSIS — R109 Unspecified abdominal pain: Secondary | ICD-10-CM | POA: Diagnosis not present

## 2022-09-01 MED ORDER — IOHEXOL 300 MG/ML  SOLN
100.0000 mL | Freq: Once | INTRAMUSCULAR | Status: AC | PRN
  Administered 2022-09-01: 100 mL via INTRAVENOUS

## 2022-09-05 ENCOUNTER — Other Ambulatory Visit: Payer: Self-pay | Admitting: Internal Medicine

## 2022-09-05 DIAGNOSIS — R4689 Other symptoms and signs involving appearance and behavior: Secondary | ICD-10-CM

## 2022-09-15 ENCOUNTER — Other Ambulatory Visit: Payer: Self-pay | Admitting: Internal Medicine

## 2022-09-27 ENCOUNTER — Ambulatory Visit (INDEPENDENT_AMBULATORY_CARE_PROVIDER_SITE_OTHER): Payer: Medicare Other | Admitting: Orthopaedic Surgery

## 2022-09-27 ENCOUNTER — Other Ambulatory Visit: Payer: Self-pay

## 2022-09-27 ENCOUNTER — Encounter: Payer: Self-pay | Admitting: Internal Medicine

## 2022-09-27 ENCOUNTER — Ambulatory Visit: Payer: Medicare Other | Attending: Internal Medicine | Admitting: Internal Medicine

## 2022-09-27 ENCOUNTER — Encounter: Payer: Self-pay | Admitting: Orthopaedic Surgery

## 2022-09-27 VITALS — BP 134/68 | HR 82 | Ht 68.0 in | Wt 183.8 lb

## 2022-09-27 DIAGNOSIS — M25511 Pain in right shoulder: Secondary | ICD-10-CM | POA: Diagnosis not present

## 2022-09-27 DIAGNOSIS — I4819 Other persistent atrial fibrillation: Secondary | ICD-10-CM | POA: Diagnosis not present

## 2022-09-27 DIAGNOSIS — R200 Anesthesia of skin: Secondary | ICD-10-CM

## 2022-09-27 DIAGNOSIS — G8929 Other chronic pain: Secondary | ICD-10-CM | POA: Diagnosis not present

## 2022-09-27 DIAGNOSIS — M792 Neuralgia and neuritis, unspecified: Secondary | ICD-10-CM | POA: Diagnosis not present

## 2022-09-27 NOTE — Progress Notes (Signed)
The patient is a 72 year old gentleman well-known to [place].  We have seen him for other orthopedic issues in the past and he comes in today with right shoulder pain but it is really his trapezius area and the suprascapular area where he is hurting on the right side and it does radiate into his neck.  He reports numbness and tingling in both of his hands.  He also reports right shoulder weakness.  He has not had any type of injury.  He does have polymyalgia rheumatica.  He is on prednisone every other day.  He cannot take anti-inflammatories due to being on Eliquis from A-fib which is under control.  He is supposed to see a neurologist sometime around the end of August.  He has even had a scan of his head which was negative apparently.  On exam with me today he does have some weakness of his triceps on his right side.  He does have significantly weak shoulder abduction and external rotation on the right side.  He does not hurt in his right shoulder at all.  We have x-rayed his right shoulder before last year which was normal.  He does have parascapular pain and trapezius pain on the right side.  There is no weakness on his left side but he does have numbness and tingling again in both his hands.  At this point I do feel a MRI of his cervical spine is warranted given the worsening radicular symptoms that he is having.  Once we have the MRI we can go from there in terms of recommendations as a relates to his neuropathic type of pain and radicular pain.

## 2022-09-27 NOTE — Progress Notes (Signed)
HPI Mr. Keesling returns today for ongoing evaluation and management of chronic atrial fib. He is a pleasant 72 yo man who has had rate controlled atrial fib for over 14 years. He is asymptomatic. He does not have palpitations. He has been active and denies chest pain or sob with exertion. He has had problems with a pinched nerve as well as musculoskeletal pain. No edema. His energy level is reduced and he wonders about long Covid or a weakening heart. Allergies  Allergen Reactions   Ampicillin     REACTION: hives Has tolerated cephalosporins     Current Outpatient Medications  Medication Sig Dispense Refill   clindamycin (CLEOCIN) 150 MG capsule Take 4 capsules by mouth as needed. Prior to dental appointments     diltiazem (CARDIZEM CD) 180 MG 24 hr capsule TAKE 1 CAPSULE BY MOUTH EVERY DAY 90 capsule 0   ELIQUIS 5 MG TABS tablet TAKE 1 TABLET BY MOUTH TWICE  DAILY 180 tablet 3   famotidine (PEPCID) 20 MG tablet Take 20 mg by mouth at bedtime.     hydrocortisone (ANUSOL-HC) 2.5 % rectal cream Place 1 application rectally 2 times daily as needed 30 g 0   montelukast (SINGULAIR) 10 MG tablet TAKE 1 TABLET (10 MG TOTAL) BY MOUTH AS NEEDED. ALLERGIES 90 tablet 1   Multiple Vitamins-Minerals (MENS MULTIVITAMIN PLUS PO) Take 1 capsule by mouth daily.     Omega-3 Fatty Acids (FISH OIL) 1200 MG CAPS Take 1 capsule by mouth daily.     omeprazole (PRILOSEC) 20 MG capsule TAKE 1 CAPSULE BY MOUTH AT BEDTIME. 90 capsule 3   predniSONE (DELTASONE) 5 MG tablet Take 1-2 tablets (5-10 mg total) by mouth every other day. Wean as directed 60 tablet 11   tadalafil (CIALIS) 5 MG tablet Take 1-2 tablets (5-10 mg total) by mouth daily. 90 tablet 8   No current facility-administered medications for this visit.     Past Medical History:  Diagnosis Date   Allergic rhinitis due to pollen    Atrial fibrillation (HCC)    A-Fib, 1987, 2003, 2005, EPS RX 2005 (A-Fib reocurred)   Basal cell carcinoma     skin CA- squamous and Basal cell   BPH (benign prostatic hypertrophy)    Cataract    Hyperlipemia    no per pt   Hypertension    no per pt   Osteoarthritis    Polymyalgia rheumatica (HCC)    Squamous cell carcinoma in situ     ROS:   All systems reviewed and negative except as noted in the HPI.   Past Surgical History:  Procedure Laterality Date   CAPSULOTOMY Bilateral 08/2007   CATARACT EXTRACTION Bilateral    both eyes   COLONOSCOPY     MENISCUS REPAIR Left 09/2009   left knee---Dr [facility]   NASAL SINUS SURGERY  May 2014   RETINAL TEAR REPAIR CRYOTHERAPY Right    right 12/06   RETINAL TEAR REPAIR CRYOTHERAPY Right    Retinal tear/bleed- Right 12/06   SQUAMOUS CELL CARCINOMA EXCISION Left 07/2008   left forearm   THUMB ARTHROSCOPY Left 1999     Family History  Problem Relation Age of Onset   Hypertension Father    Cirrhosis Father 97   Alcohol abuse Father    Gallstones Mother    Hyperlipidemia Mother    Esophageal cancer Brother    Asthma Daughter    Coronary artery disease Neg Hx    Diabetes Neg Hx  Cancer Neg Hx        prostate or colon   Colon cancer Neg Hx    Rectal cancer Neg Hx    Stomach cancer Neg Hx      Social History   Socioeconomic History   Marital status: Married    Spouse name: Not on file   Number of children: 3   Years of education: Not on file   Highest education level: Not on file  Occupational History   Occupation: retired Mudlogger: RETIRED  Tobacco Use   Smoking status: Never    Passive exposure: Never   Smokeless tobacco: Never  Vaping Use   Vaping Use: Never used  Substance and Sexual Activity   Alcohol use: Yes    Comment: occ. 3 per month   Drug use: Never   Sexual activity: Yes  Other Topics Concern   Not on file  Social History Narrative   Has living will   Wife is health care POA--alternate is son Arlys John   Would accept resuscitation attempts   Would accept tube feeds for  some time--depending on prognosis   Social Determinants of Health   Financial Resource Strain: Not on file  Food Insecurity: Not on file  Transportation Needs: Not on file  Physical Activity: Not on file  Stress: Not on file  Social Connections: Not on file  Intimate Partner Violence: Not on file     BP 134/68   Pulse 82   Ht 5\' 8"  (1.727 m)   Wt 183 lb 12.8 oz (83.4 kg)   SpO2 97%   BMI 27.95 kg/m   Physical Exam:  Well appearing NAD HEENT: Unremarkable Neck:  No JVD, no thyromegally Lymphatics:  No adenopathy Back:  No CVA tenderness Lungs:  Clear with no wheezes HEART:  IRegular rate rhythm, no murmurs, no rubs, no clicks Abd:  soft, positive bowel sounds, no organomegally, no rebound, no guarding Ext:  2 plus pulses, no edema, no cyanosis, no clubbing Skin:  No rashes no nodules Neuro:  CN II through XII intact, motor grossly intact  EKG - atrial fib with a CVR   Assess/Plan: 1. Chronic atrial fib - his rates are well controlled. His CHADSVASC score is now 2. I have recommended he continue eliquis.    2. HTN - his bp is minimally elevated on cardizem. He will continue this medication.  3. Fatigue - he is more tired. I have recommended he undergo a 2D echo to rule out an afib induced CM.  4. Coags - he has had no bleeding on his systemic OAC. Continue.   Sharlot Gowda Bridney Guadarrama,MD

## 2022-09-27 NOTE — Patient Instructions (Signed)
Medication Instructions:  Your physician recommends that you continue on your current medications as directed. Please refer to the Current Medication list given to you today. *If you need a refill on your cardiac medications before your next appointment, please call your pharmacy*   Testing/Procedures: Echocardiogram Your physician has requested that you have an echocardiogram. Echocardiography is a painless test that uses sound waves to create images of your heart. It provides your doctor with information about the size and shape of your heart and how well your heart's chambers and valves are working. This procedure takes approximately one hour. There are no restrictions for this procedure. Please do NOT wear cologne, perfume, aftershave, or lotions (deodorant is allowed). Please arrive 15 minutes prior to your appointment time.   Follow-Up: At [facility], you and your health needs are our priority.  As part of our continuing mission to provide you with exceptional heart care, we have created designated Provider Care Teams.  These Care Teams include your primary Cardiologist (physician) and Advanced Practice Providers (APPs -  Physician Assistants and Nurse Practitioners) who all work together to provide you with the care you need, when you need it.  We recommend signing up for the patient portal called "MyChart".  Sign up information is provided on this After Visit Summary.  MyChart is used to connect with patients for Virtual Visits (Telemedicine).  Patients are able to view lab/test results, encounter notes, upcoming appointments, etc.  Non-urgent messages can be sent to your provider as well.   To learn more about what you can do with MyChart, go to [email or web address].    Your next appointment:   1 year(s)  Provider:   Lewayne Bunting, MD

## 2022-09-29 DIAGNOSIS — M9902 Segmental and somatic dysfunction of thoracic region: Secondary | ICD-10-CM | POA: Diagnosis not present

## 2022-09-29 DIAGNOSIS — M62830 Muscle spasm of back: Secondary | ICD-10-CM | POA: Diagnosis not present

## 2022-09-29 DIAGNOSIS — M5136 Other intervertebral disc degeneration, lumbar region: Secondary | ICD-10-CM | POA: Diagnosis not present

## 2022-09-29 DIAGNOSIS — M9903 Segmental and somatic dysfunction of lumbar region: Secondary | ICD-10-CM | POA: Diagnosis not present

## 2022-10-02 ENCOUNTER — Ambulatory Visit
Admission: RE | Admit: 2022-10-02 | Discharge: 2022-10-02 | Disposition: A | Discharge status: Home / Self Care | Payer: Medicare Other | Source: Ambulatory Visit | Attending: Orthopaedic Surgery | Admitting: Orthopaedic Surgery

## 2022-10-02 DIAGNOSIS — M4802 Spinal stenosis, cervical region: Secondary | ICD-10-CM | POA: Diagnosis not present

## 2022-10-02 DIAGNOSIS — M4722 Other spondylosis with radiculopathy, cervical region: Secondary | ICD-10-CM | POA: Diagnosis not present

## 2022-10-02 DIAGNOSIS — M792 Neuralgia and neuritis, unspecified: Secondary | ICD-10-CM

## 2022-10-08 DIAGNOSIS — H903 Sensorineural hearing loss, bilateral: Secondary | ICD-10-CM | POA: Diagnosis not present

## 2022-10-08 DIAGNOSIS — R221 Localized swelling, mass and lump, neck: Secondary | ICD-10-CM | POA: Diagnosis not present

## 2022-10-09 ENCOUNTER — Other Ambulatory Visit: Payer: Medicare Other

## 2022-10-13 ENCOUNTER — Other Ambulatory Visit: Payer: Self-pay

## 2022-10-13 ENCOUNTER — Ambulatory Visit: Payer: Medicare Other | Attending: Internal Medicine

## 2022-10-13 ENCOUNTER — Ambulatory Visit (INDEPENDENT_AMBULATORY_CARE_PROVIDER_SITE_OTHER): Payer: Medicare Other | Admitting: Orthopaedic Surgery

## 2022-10-13 ENCOUNTER — Encounter: Payer: Self-pay | Admitting: Orthopaedic Surgery

## 2022-10-13 DIAGNOSIS — I4819 Other persistent atrial fibrillation: Secondary | ICD-10-CM | POA: Insufficient documentation

## 2022-10-13 DIAGNOSIS — M4802 Spinal stenosis, cervical region: Secondary | ICD-10-CM

## 2022-10-13 DIAGNOSIS — M792 Neuralgia and neuritis, unspecified: Secondary | ICD-10-CM

## 2022-10-13 LAB — ECHOCARDIOGRAM COMPLETE
AR max vel: 2.33 cm2
AV Area VTI: 2.26 cm2
AV Area mean vel: 2.23 cm2
AV Mean grad: 4.6 mmHg
AV Peak grad: 8.7 mmHg
AV Vena cont: 0.3 cm
Ao pk vel: 1.48 m/s
Calc EF: 58 %
P 1/2 time: 720 msec
S' Lateral: 3.6 cm
Single Plane A2C EF: 57.1 %
Single Plane A4C EF: 57.8 %

## 2022-10-13 MED ORDER — GABAPENTIN 300 MG PO CAPS: 300.0000 mg | ORAL_CAPSULE | Freq: Three times a day (TID) | ORAL | 1 refills | Status: DC

## 2022-10-13 MED ORDER — METHOCARBAMOL 500 MG PO TABS: 500.0000 mg | ORAL_TABLET | Freq: Every evening | ORAL | 1 refills | Status: DC

## 2022-10-13 NOTE — Progress Notes (Signed)
HPI: [name] returns today to go over the MRI of his cervical spine.  He continues to have pain in the right periscapular area and up into his neck.  He also notes that he has difficulty with bringing his right arm away from his body.  He notes decreased sensation over both forearms touch and involving multiple fingers particularly on the right hand he notes it mostly affects the thumb index and the ring finger.  He is having difficulty sleeping due to the pain in the right scapular area.  He is wondering if his symptoms may be coming from long-term COVID or possibly Lyme's disease.  He has not been diagnosed with Lyme's disease but has been bitten by many ticks over the years.  He describes his neck and shoulder pain to be sharp and constant.  He is unable to take NSAIDs due to his chronic anticoagulation.  He is on chronic prednisone due to his polymyalgia rheumatica.  He has been using Voltaren gel over the shoulder and neck area to sleep at night.  MRI images and results were reviewed with the patient.  MRI showed C3-C4 with mild to moderate left foraminal stenosis.  Facet arthropathy mild to moderate worse on the left.  C4-C5 degenerative disc disease.  Mild to moderate left foraminal narrowing.  C5-C6 moderate to moderately severe foraminal narrowing worse on the right.  Also degenerative disc disease.  There is mild flattening of the ventral cord.  C6-C7 moderate bilateral foraminal stenosis.   Review of systems: See HPI otherwise negative or noncontributory  Physical exam: General well-developed well-nourished male in no acute distress mood and affect appropriate.  Psych alert and oriented x 3. Vascular: Radial pulses are 2+ and equal symmetric. Upper extremities: Left shoulder full range of motion.  Right shoulder he has weakness with abduction.  Passively I can bring his arm full abduction on the right.  Tenderness on the medial border right scapula only.  Bicep strength is 5 out of 5  bilaterally.  Triceps 5 out of 5 on the left.  Right is 4 out of 5 strength.  Subjective decrease sensation with palpation over the dorsal aspects of bilateral forearms.  Decreased subjective sensation involving the right thumb index fingers to light touch.  Extension of the fingers reveals slight weakness on the right with extension of the index and long finger right hand.    Impression: Cervical stenosis Right cervical radiculopathy  Plan: Will refer him to neurosurgery . He would like to see Dr. Wynetta Emery.  In the interim we will place him on Neurontin and Robaxin at night to see if this will afford him some sleep and comfort.  Advised him to limit any weight lifting as he does like to work out.  Questions were encouraged and answered at length.  Follow-up with [place] as needed

## 2022-10-20 ENCOUNTER — Other Ambulatory Visit: Payer: Self-pay

## 2022-10-20 DIAGNOSIS — I059 Rheumatic mitral valve disease, unspecified: Secondary | ICD-10-CM

## 2022-10-27 ENCOUNTER — Encounter: Payer: Self-pay | Admitting: Internal Medicine

## 2022-10-27 ENCOUNTER — Ambulatory Visit (INDEPENDENT_AMBULATORY_CARE_PROVIDER_SITE_OTHER): Payer: Medicare Other | Admitting: Internal Medicine

## 2022-10-27 VITALS — BP 122/70 | HR 85 | Temp 97.8°F | Ht 67.75 in | Wt 182.0 lb

## 2022-10-27 DIAGNOSIS — M353 Polymyalgia rheumatica: Secondary | ICD-10-CM | POA: Diagnosis not present

## 2022-10-27 DIAGNOSIS — I10 Essential (primary) hypertension: Secondary | ICD-10-CM

## 2022-10-27 DIAGNOSIS — N401 Enlarged prostate with lower urinary tract symptoms: Secondary | ICD-10-CM

## 2022-10-27 DIAGNOSIS — K219 Gastro-esophageal reflux disease without esophagitis: Secondary | ICD-10-CM | POA: Diagnosis not present

## 2022-10-27 DIAGNOSIS — Z0000 Encounter for general adult medical examination without abnormal findings: Secondary | ICD-10-CM | POA: Diagnosis not present

## 2022-10-27 DIAGNOSIS — N138 Other obstructive and reflux uropathy: Secondary | ICD-10-CM | POA: Diagnosis not present

## 2022-10-27 DIAGNOSIS — I4819 Other persistent atrial fibrillation: Secondary | ICD-10-CM

## 2022-10-27 DIAGNOSIS — Z1159 Encounter for screening for other viral diseases: Secondary | ICD-10-CM

## 2022-10-27 NOTE — Assessment & Plan Note (Signed)
BP Readings from Last 3 Encounters:  10/27/22 122/70  09/27/22 134/68  08/24/22 124/74   On diltiazem

## 2022-10-27 NOTE — Assessment & Plan Note (Signed)
Symptoms better on the omeprazole Discussed skipping some days Seeing GI soon

## 2022-10-27 NOTE — Assessment & Plan Note (Signed)
Mild If worsens, would try tamsulosin

## 2022-10-27 NOTE — Assessment & Plan Note (Signed)
In remission on prednisone 6mg  every other day No wean for now--till neck/shoulder issues resolved

## 2022-10-27 NOTE — Assessment & Plan Note (Signed)
I have personally reviewed the Medicare Annual Wellness questionnaire and have noted 1. The patient's medical and social history 2. Their use of alcohol, tobacco or illicit drugs 3. Their current medications and supplements 4. The patient's functional ability including ADL's, fall risks, home safety risks and hearing or visual             impairment. 5. Diet and physical activities 6. Evidence for depression or mood disorders  The patients weight, height, BMI and visual acuity have been recorded in the chart I have made referrals, counseling and provided education to the patient based review of the above and I have provided the pt with a written personalized care plan for preventive services.  I have provided you with a copy of your personalized plan for preventive services. Please take the time to review along with your updated medication list.  Last screening colon due 2026 No more PSA due to age Tries to exercise--limited due to shoulder Update COVID/flu vaccines in the fall---and RSV Consider shingrix

## 2022-10-27 NOTE — Assessment & Plan Note (Signed)
Rate is controlled on diltiazem 180mg  daily Eliquis 5 bid

## 2022-10-27 NOTE — Progress Notes (Signed)
Hearing Screening - Comments:: Has a hearing aids. Wearing them today. Vision Screening - Comments:: March 2024

## 2022-10-27 NOTE — Progress Notes (Signed)
Subjective:    Patient ID: Rodney Gray, male    DOB: Aug 07, 1950, 72 y.o.   MRN: [id number]  HPI Here for Medicare wellness visit and follow up of chronic health conditions Reviewed advanced directives Reviewed other doctros--Dr Lanny Cramp, Dr Pilar Jarvis cardiology, Dr Jonny Ruiz Beshel-chiropractic, Dr Rober Minion, Dr Sninsky--urology, Dr Orlean Bradford, Dr Shelton Silvas, Dr Isabella Bowens, Dr Ty Hilts Had skin cancer removal from left hand--no other surgery this year. No hospitalizations Tries to do some exercise--but very limited due to shoulder/neck issues. Does walk and do "peddler" Vision okay--slightly blurry at times Hearing aides---they help Rare alcohol No tobacco No falls Feels depressed due to shoulder problems--no anhedonia Instrumental ADLs No sig memory issues  Doing better---but still "not like I like" Stomach trouble has eased Taking the omeprazole nightly still Weight has stabilized---eating better  Seeing neurosurgeon tomorrow (Dr Cram)--sent by Dr Magnus Ivan Stenosis and nerve impingement noted Still some distal arm/hand numbness--and pain at times  PMR seems controlled with prednisone 6mg  every other day  No chest pain or SOB No palpitations No dizziness or syncope No edema No headaches since May  Voids okay Stream okay Seems to empty okay  Hasn't needed the montelukast recently--uses for sinus issues Uses mask when cutting the grass  Current Outpatient Medications on File Prior to Visit  Medication Sig Dispense Refill   clindamycin (CLEOCIN) 150 MG capsule Take 4 capsules by mouth as needed. Prior to dental appointments     diltiazem (CARDIZEM CD) 180 MG 24 hr capsule TAKE 1 CAPSULE BY MOUTH EVERY DAY 90 capsule 0   ELIQUIS 5 MG TABS tablet TAKE 1 TABLET BY MOUTH TWICE  DAILY 180 tablet 3   hydrocortisone (ANUSOL-HC) 2.5 % rectal cream Place 1 application rectally 2 times daily as needed 30 g 0   methocarbamol (ROBAXIN) 500 MG tablet Take 1  tablet (500 mg total) by mouth at bedtime. 30 tablet 1   montelukast (SINGULAIR) 10 MG tablet TAKE 1 TABLET (10 MG TOTAL) BY MOUTH AS NEEDED. ALLERGIES 90 tablet 1   Multiple Vitamins-Minerals (MENS MULTIVITAMIN PLUS PO) Take 1 capsule by mouth daily.     Omega-3 Fatty Acids (FISH OIL) 1200 MG CAPS Take 1 capsule by mouth daily.     omeprazole (PRILOSEC) 20 MG capsule TAKE 1 CAPSULE BY MOUTH AT BEDTIME. 90 capsule 3   predniSONE (DELTASONE) 5 MG tablet Take 1-2 tablets (5-10 mg total) by mouth every other day. Wean as directed 60 tablet 11   tadalafil (CIALIS) 5 MG tablet Take 1-2 tablets (5-10 mg total) by mouth daily. 90 tablet 8   No current facility-administered medications on file prior to visit.    Allergies  Allergen Reactions   Ampicillin     REACTION: hives Has tolerated cephalosporins    Past Medical History:  Diagnosis Date   Allergic rhinitis due to pollen    Atrial fibrillation (HCC)    A-Fib, 1987, 2003, 2005, EPS RX 2005 (A-Fib reocurred)   Basal cell carcinoma    skin CA- squamous and Basal cell   BPH (benign prostatic hypertrophy)    Cataract    Hyperlipemia    no per pt   Hypertension    no per pt   Osteoarthritis    Polymyalgia rheumatica (HCC)    Squamous cell carcinoma in situ     Past Surgical History:  Procedure Laterality Date   CAPSULOTOMY Bilateral 08/2007   CATARACT EXTRACTION Bilateral    both eyes   COLONOSCOPY     MENISCUS REPAIR Left  09/2009   left knee---Dr [facility]   NASAL SINUS SURGERY  May 2014   RETINAL TEAR REPAIR CRYOTHERAPY Right    right 12/06   RETINAL TEAR REPAIR CRYOTHERAPY Right    Retinal tear/bleed- Right 12/06   SQUAMOUS CELL CARCINOMA EXCISION Left 07/2008   left forearm   THUMB ARTHROSCOPY Left 1999    Family History  Problem Relation Age of Onset   Hypertension Father    Cirrhosis Father 14   Alcohol abuse Father    Gallstones Mother    Hyperlipidemia Mother    Esophageal cancer Brother    Asthma Daughter     Coronary artery disease Neg Hx    Diabetes Neg Hx    Cancer Neg Hx        prostate or colon   Colon cancer Neg Hx    Rectal cancer Neg Hx    Stomach cancer Neg Hx     Social History   Socioeconomic History   Marital status: Married    Spouse name: Not on file   Number of children: 3   Years of education: Not on file   Highest education level: Not on file  Occupational History   Occupation: retired Mudlogger: RETIRED  Tobacco Use   Smoking status: Never    Passive exposure: Never   Smokeless tobacco: Never  Vaping Use   Vaping status: Never Used  Substance and Sexual Activity   Alcohol use: Yes    Comment: occ. 3 per month   Drug use: Never   Sexual activity: Yes  Other Topics Concern   Not on file  Social History Narrative   Has living will   Wife is health care POA--alternate is son Arlys John   Would accept resuscitation attempts   Would accept tube feeds for some time--depending on prognosis   Social Determinants of Health   Financial Resource Strain: Not on file  Food Insecurity: Not on file  Transportation Needs: Not on file  Physical Activity: Not on file  Stress: Not on file  Social Connections: Not on file  Intimate Partner Violence: Not on file   Review of Systems Appetite is better Weight stable Not sleeping well due to shoulder issues Wears seat belt Teeth okay---keeps up with dentist Bowels move okay--no blood No suspicious skin lesions now No other arthritic problems     Objective:   Physical Exam Constitutional:      Appearance: Normal appearance.  HENT:     Mouth/Throat:     Pharynx: No oropharyngeal exudate or posterior oropharyngeal erythema.  Eyes:     Conjunctiva/sclera: Conjunctivae normal.     Pupils: Pupils are equal, round, and reactive to light.  Cardiovascular:     Rate and Rhythm: Normal rate. Rhythm irregular.     Pulses: Normal pulses.     Heart sounds: No murmur heard.    No gallop.   Pulmonary:     Effort: Pulmonary effort is normal.     Breath sounds: Normal breath sounds. No wheezing or rales.  Abdominal:     Palpations: Abdomen is soft.     Tenderness: There is no abdominal tenderness.  Musculoskeletal:     Cervical back: Neck supple.     Right lower leg: No edema.     Left lower leg: No edema.     Comments: Sig restriction in ROM at right shoulder  Lymphadenopathy:     Cervical: No cervical adenopathy.  Skin:    Findings:  No lesion or rash.  Neurological:     General: No focal deficit present.     Mental Status: He is alert and oriented to person, place, and time.     Comments: Word naming---14/30 seconds Recall 3/3 Strength fairly normal in arms  Psychiatric:        Mood and Affect: Mood normal.        Behavior: Behavior normal.            Assessment & Plan:

## 2022-10-28 DIAGNOSIS — M542 Cervicalgia: Secondary | ICD-10-CM | POA: Diagnosis not present

## 2022-10-28 DIAGNOSIS — M5412 Radiculopathy, cervical region: Secondary | ICD-10-CM | POA: Diagnosis not present

## 2022-11-01 ENCOUNTER — Other Ambulatory Visit: Payer: Self-pay | Admitting: Neurosurgery

## 2022-11-01 DIAGNOSIS — R29898 Other symptoms and signs involving the musculoskeletal system: Secondary | ICD-10-CM

## 2022-11-02 ENCOUNTER — Ambulatory Visit
Admission: RE | Admit: 2022-11-02 | Discharge: 2022-11-02 | Disposition: A | Discharge status: Home / Self Care | Payer: Medicare Other | Source: Ambulatory Visit | Attending: Neurosurgery | Admitting: Neurosurgery

## 2022-11-02 DIAGNOSIS — M9902 Segmental and somatic dysfunction of thoracic region: Secondary | ICD-10-CM | POA: Diagnosis not present

## 2022-11-02 DIAGNOSIS — M9903 Segmental and somatic dysfunction of lumbar region: Secondary | ICD-10-CM | POA: Diagnosis not present

## 2022-11-02 DIAGNOSIS — M7581 Other shoulder lesions, right shoulder: Secondary | ICD-10-CM | POA: Diagnosis not present

## 2022-11-02 DIAGNOSIS — L570 Actinic keratosis: Secondary | ICD-10-CM | POA: Diagnosis not present

## 2022-11-02 DIAGNOSIS — D485 Neoplasm of uncertain behavior of skin: Secondary | ICD-10-CM | POA: Diagnosis not present

## 2022-11-02 DIAGNOSIS — M62830 Muscle spasm of back: Secondary | ICD-10-CM | POA: Diagnosis not present

## 2022-11-02 DIAGNOSIS — R29898 Other symptoms and signs involving the musculoskeletal system: Secondary | ICD-10-CM | POA: Insufficient documentation

## 2022-11-02 DIAGNOSIS — D230 Other benign neoplasm of skin of lip: Secondary | ICD-10-CM | POA: Diagnosis not present

## 2022-11-02 DIAGNOSIS — M129 Arthropathy, unspecified: Secondary | ICD-10-CM | POA: Diagnosis not present

## 2022-11-02 DIAGNOSIS — S43431A Superior glenoid labrum lesion of right shoulder, initial encounter: Secondary | ICD-10-CM | POA: Diagnosis not present

## 2022-11-02 DIAGNOSIS — M5136 Other intervertebral disc degeneration, lumbar region: Secondary | ICD-10-CM | POA: Diagnosis not present

## 2022-11-02 DIAGNOSIS — M25511 Pain in right shoulder: Secondary | ICD-10-CM | POA: Diagnosis not present

## 2022-11-02 DIAGNOSIS — Z859 Personal history of malignant neoplasm, unspecified: Secondary | ICD-10-CM | POA: Diagnosis not present

## 2022-11-02 DIAGNOSIS — D492 Neoplasm of unspecified behavior of bone, soft tissue, and skin: Secondary | ICD-10-CM | POA: Diagnosis not present

## 2022-11-18 ENCOUNTER — Telehealth: Payer: Self-pay | Admitting: Internal Medicine

## 2022-11-18 DIAGNOSIS — R29898 Other symptoms and signs involving the musculoskeletal system: Secondary | ICD-10-CM | POA: Diagnosis not present

## 2022-11-18 DIAGNOSIS — M5412 Radiculopathy, cervical region: Secondary | ICD-10-CM | POA: Diagnosis not present

## 2022-11-18 DIAGNOSIS — M542 Cervicalgia: Secondary | ICD-10-CM | POA: Diagnosis not present

## 2022-11-18 MED ORDER — PREDNISONE 1 MG PO TABS: 1.0000 mg | ORAL_TABLET | ORAL | 3 refills | Status: DC

## 2022-11-18 NOTE — Telephone Encounter (Signed)
Spoke to pt. He had a surplus of 1mg  for awhile. Takes  6mg  every other day. Will send rx.

## 2022-11-18 NOTE — Telephone Encounter (Signed)
Patient return called to the office would like a call back when ever  possible.[phone].

## 2022-11-18 NOTE — Telephone Encounter (Signed)
Prescription Request  11/18/2022  LOV: 10/27/2022  What is the name of the medication or equipment? Prednisone 1 mg  Have you contacted your pharmacy to request a refill? Yes   Which pharmacy would you like this sent to?   [id number]ann Sheen, [place] - [address] [place] [address] Phone: [phone] Fax: [phone]   Phone: [phone] Fax: [phone]    Patient notified that their request is being sent to the clinical staff for review and that they should receive a response within 2 business days.   Please advise at [facility] [phone]  Patient stated that he was switched to 1 mg of this medication.However on his medication list it says 5 mg.

## 2022-11-19 ENCOUNTER — Telehealth: Payer: Self-pay | Admitting: *Deleted

## 2022-11-19 NOTE — Telephone Encounter (Signed)
     Primary Cardiologist: Lewayne Bunting, MD  Chart reviewed as part of pre-operative protocol coverage. Given past medical history and time since last visit, based on ACC/AHA guidelines, Larwrence Brazzel Seiden would be at acceptable risk for the planned procedure without further cardiovascular testing.   His RCRI is a class II risk, 0.9% risk of major cardiac event.  Patient with diagnosis of atrial fibrillation on Eliquis for anticoagulation.     Procedure:   ACDF C5-C6-C6-C7   Date of Surgery:  Clearance TBD       CHA2DS2-VASc Score =     This indicates a  % annual risk of stroke. The patient's score is based upon: CHF History: 0 HTN History: 1 Vascular Disease History: 0 Age Score: 1 Gender Score: 0        CrCl 101 Platelet count 239   Per office protocol, patient can hold Eliquis for 3 days prior to procedure.   Patient will not need bridging with Lovenox (enoxaparin) around procedure.  I will route this recommendation to the requesting party via Epic fax function and remove from pre-op pool.  Please call with questions.  Thomasene Ripple. Pedrohenrique Mcconville NP-C     11/19/2022, 3:32 PM [facility] [address] Office [phone] Fax [phone]

## 2022-11-19 NOTE — Telephone Encounter (Signed)
Patient with diagnosis of atrial fibrillation on Eliquis for anticoagulation.    Procedure:   ACDF C5-C6-C6-C7   Date of Surgery:  Clearance TBD     CHA2DS2-VASc Score =     This indicates a  % annual risk of stroke. The patient's score is based upon: CHF History: 0 HTN History: 1 Vascular Disease History: 0 Age Score: 1 Gender Score: 0       CrCl 101 Platelet count 239  Per office protocol, patient can hold Eliquis for 3 days prior to procedure.   Patient will not need bridging with Lovenox (enoxaparin) around procedure.  **This guidance is not considered finalized until pre-operative APP has relayed final recommendations.**

## 2022-11-19 NOTE — Telephone Encounter (Signed)
   Pre-operative Risk Assessment    Patient Name: Rodney Gray  DOB: [birth year] MRN: [id number]      Request for Surgical Clearance    Procedure:   ACDF C5-C6-C6-C7  Date of Surgery:  Clearance TBD                                 Surgeon:  Dr. Ronney Lion Surgeon's Group or Practice Name:  [facility] Phone number:  [phone] Fax number:  [phone]   Type of Clearance Requested:   - Medical  - Pharmacy:  Hold Apixaban (Eliquis) Not Indicated.   Type of Anesthesia:  General    Additional requests/questions:    Signed, Emmit Pomfret   11/19/2022, 1:48 PM

## 2022-11-26 ENCOUNTER — Other Ambulatory Visit: Payer: Self-pay | Admitting: Neurosurgery

## 2022-11-29 ENCOUNTER — Ambulatory Visit: Payer: Medicare Other | Admitting: Neurology

## 2022-11-29 ENCOUNTER — Ambulatory Visit (INDEPENDENT_AMBULATORY_CARE_PROVIDER_SITE_OTHER): Payer: Medicare Other | Admitting: Orthopaedic Surgery

## 2022-11-29 DIAGNOSIS — M25511 Pain in right shoulder: Secondary | ICD-10-CM

## 2022-11-29 DIAGNOSIS — G8929 Other chronic pain: Secondary | ICD-10-CM | POA: Diagnosis not present

## 2022-11-29 NOTE — Progress Notes (Signed)
The patient is a regular patient of ours.  He is 72 years old and 2 weeks/day is having an ACDF by Dr. Wynetta Emery.  He has been having some right shoulder issues for some time now.  I have x-rayed that shoulder in the past but after continued pain and some weakness Dr. Wynetta Emery did order a MRI appropriately of the right shoulder.  The patient comes in today saying a lot of his pain is around the trapezius and parascapular area of his shoulder.  He does report decreased shoulder motion and some weakness as well.  On my exam he does have a lot of pain along the medial border of the scapula and there is actually some grinding at the insertion of the serratus anterior and rhomboids.  His shoulder itself on the right side is well located.  He does show positive Neer and Hawkins signs.  There is only slight weakness of the rotator cuff.  His external rotation is full.  Again he points to the trapezius area as the main source of his discomfort and pain.  I did look at the MRI myself of his right shoulder.  There is just a very small partial tear of the rotator cuff at the insertion and it is more of an interstitial type of tear.  He has significant arthropathy of his AC joint.  There is also moderate tendinosis of the rotator cuff itself.  From my perspective, I would like him to get through his ACDF first before proceeding with any other intervention as it relates to his right shoulder.  He may end up benefiting from physical therapy on the right shoulder in terms of any modalities that can help decrease the suprascapular and trapezius pain such as dry needling.  I will hold off on any type of steroid injection until he is further out from his surgery given the fact any type of steroid injection can drop some of his immune response.  He may end up eventually needing an arthroscopic intervention on his right shoulder to deal with the tendinosis and to further assess the rotator cuff as well as address the [facility] joint arthropathy.   From my standpoint I will see him back in 5 weeks from today.  At that point he will be 3 weeks out from his surgery and we will be able to examine him again and then go from there in terms of further treatment recommendations.  He and his wife agree with this treatment plan.

## 2022-11-30 DIAGNOSIS — M62830 Muscle spasm of back: Secondary | ICD-10-CM | POA: Diagnosis not present

## 2022-11-30 DIAGNOSIS — M9903 Segmental and somatic dysfunction of lumbar region: Secondary | ICD-10-CM | POA: Diagnosis not present

## 2022-11-30 DIAGNOSIS — M9902 Segmental and somatic dysfunction of thoracic region: Secondary | ICD-10-CM | POA: Diagnosis not present

## 2022-11-30 DIAGNOSIS — M5136 Other intervertebral disc degeneration, lumbar region: Secondary | ICD-10-CM | POA: Diagnosis not present

## 2022-12-02 NOTE — Progress Notes (Signed)
Surgical Instructions   Your procedure is scheduled on Monday December 13, 2022. Report to [facility] Main Entrance "A" at 5:30 A.M., then check in with the Admitting office. Any questions or running late day of surgery: call [phone]  Questions prior to your surgery date: call [phone], Monday-Friday, 8am-4pm. If you experience any cold or flu symptoms such as cough, fever, chills, shortness of breath, etc. between now and your scheduled surgery, please notify us at the above number.     Remember:  Do not eat or drink after midnight the night before your surgery  Take these medicines the morning of surgery with A SIP OF WATER  ARTIFICIAL TEAR SOLUTION OP  diltiazem (CARDIZEM CD)   May take these medicines IF NEEDED: montelukast (SINGULAIR)   Per your surgeon's instructions, STOP taking your ELIQUIS 3 days prior to surgery, with the last dose being 12/09/2022.    One week prior to surgery, STOP taking any Aspirin (unless otherwise instructed by your surgeon) Aleve, Naproxen, Ibuprofen, Motrin, Advil, Goody's, BC's, all herbal medications, fish oil, and non-prescription vitamins. This includes your diclofenac Sodium (VOLTAREN) 1 % GEL.                      Do NOT Smoke (Tobacco/Vaping) for 24 hours prior to your procedure.  If you use a CPAP at night, you may bring your mask/headgear for your overnight stay.   You will be asked to remove any contacts, glasses, piercing's, hearing aid's, dentures/partials prior to surgery. Please bring cases for these items if needed.    Patients discharged the day of surgery will not be allowed to drive home, and someone needs to stay with them for 24 hours.  SURGICAL WAITING ROOM VISITATION Patients may have no more than 2 support people in the waiting area - these visitors may rotate.   Pre-op nurse will coordinate an appropriate time for 1 ADULT support person, who may not rotate, to accompany patient in pre-op.  Children under the age  of 63 must have an adult with them who is not the patient and must remain in the main waiting area with an adult.  If the patient needs to stay at the hospital during part of their recovery, the visitor guidelines for inpatient rooms apply.  Please refer to the [facility] website for the visitor guidelines for any additional information.   If you received a COVID test during your pre-op visit  it is requested that you wear a mask when out in public, stay away from anyone that may not be feeling well and notify your surgeon if you develop symptoms. If you have been in contact with anyone that has tested positive in the last 10 days please notify you surgeon.      Pre-operative 5 CHG Bathing Instructions   You can play a key role in reducing the risk of infection after surgery. Your skin needs to be as free of germs as possible. You can reduce the number of germs on your skin by washing with CHG (chlorhexidine gluconate) soap before surgery. CHG is an antiseptic soap that kills germs and continues to kill germs even after washing.   DO NOT use if you have an allergy to chlorhexidine/CHG or antibacterial soaps. If your skin becomes reddened or irritated, stop using the CHG and notify one of our RNs at [phone].   Please shower with the CHG soap starting 4 days before surgery using the following schedule:  Please keep in mind the following:  DO NOT shave, including legs and underarms, starting the day of your first shower.   You may shave your face at any point before/day of surgery.  Place clean sheets on your bed the day you start using CHG soap. Use a clean washcloth (not used since being washed) for each shower. DO NOT sleep with pets once you start using the CHG.   CHG Shower Instructions:  If you choose to wash your hair and private area, wash first with your normal shampoo/soap.  After you use shampoo/soap, rinse your hair and body thoroughly to remove shampoo/soap residue.   Turn the water OFF and apply about 3 tablespoons (45 ml) of CHG soap to a CLEAN washcloth.  Apply CHG soap ONLY FROM YOUR NECK DOWN TO YOUR TOES (washing for 3-5 minutes)  DO NOT use CHG soap on face, private areas, open wounds, or sores.  Pay special attention to the area where your surgery is being performed.  If you are having back surgery, having someone wash your back for you may be helpful. Wait 2 minutes after CHG soap is applied, then you may rinse off the CHG soap.  Pat dry with a clean towel  Put on clean clothes/pajamas   If you choose to wear lotion, please use ONLY the CHG-compatible lotions on the back of this paper.   Additional instructions for the day of surgery: DO NOT APPLY any lotions, deodorants or cologne.  Do not bring valuables to the hospital. [facility] is not responsible for any belongings/valuables. Do not wear jewelry Put on clean/comfortable clothes.  Please brush your teeth.  Ask your nurse before applying any prescription medications to the skin.     CHG Compatible Lotions   Aveeno Moisturizing lotion  Cetaphil Moisturizing Cream  Cetaphil Moisturizing Lotion  Clairol Herbal Essence Moisturizing Lotion, Dry Skin  Clairol Herbal Essence Moisturizing Lotion, Extra Dry Skin  Clairol Herbal Essence Moisturizing Lotion, Normal Skin  Curel Age Defying Therapeutic Moisturizing Lotion with Alpha Hydroxy  Curel Extreme Care Body Lotion  Curel Soothing Hands Moisturizing Hand Lotion  Curel Therapeutic Moisturizing Cream, Fragrance-Free  Curel Therapeutic Moisturizing Lotion, Fragrance-Free  Curel Therapeutic Moisturizing Lotion, Original Formula  Eucerin Daily Replenishing Lotion  Eucerin Dry Skin Therapy Plus Alpha Hydroxy Crme  Eucerin Dry Skin Therapy Plus Alpha Hydroxy Lotion  Eucerin Original Crme  Eucerin Original Lotion  Eucerin Plus Crme Eucerin Plus Lotion  Eucerin TriLipid Replenishing Lotion  Keri Anti-Bacterial Hand Lotion  Keri Deep  Conditioning Original Lotion Dry Skin Formula Softly Scented  Keri Deep Conditioning Original Lotion, Fragrance Free Sensitive Skin Formula  Keri Lotion Fast Absorbing Fragrance Free Sensitive Skin Formula  Keri Lotion Fast Absorbing Softly Scented Dry Skin Formula  Keri Original Lotion  Keri Skin Renewal Lotion Keri Silky Smooth Lotion  Keri Silky Smooth Sensitive Skin Lotion  Nivea Body Creamy Conditioning Oil  Nivea Body Extra Enriched Lotion  Nivea Body Original Lotion  Nivea Body Sheer Moisturizing Lotion Nivea Crme  Nivea Skin Firming Lotion  NutraDerm 30 Skin Lotion  NutraDerm Skin Lotion  NutraDerm Therapeutic Skin Cream  NutraDerm Therapeutic Skin Lotion  ProShield Protective Hand Cream  Provon moisturizing lotion  Please read over the following fact sheets that you were given.

## 2022-12-03 ENCOUNTER — Other Ambulatory Visit: Payer: Self-pay

## 2022-12-03 ENCOUNTER — Encounter (HOSPITAL_COMMUNITY)
Admission: RE | Admit: 2022-12-03 | Discharge: 2022-12-03 | Disposition: A | Discharge status: Home / Self Care | Payer: Medicare Other | Source: Ambulatory Visit | Attending: Neurosurgery | Admitting: Neurosurgery

## 2022-12-03 ENCOUNTER — Encounter (HOSPITAL_COMMUNITY): Payer: Self-pay

## 2022-12-03 VITALS — BP 133/76 | HR 55 | Temp 97.9°F | Resp 18 | Ht 68.0 in | Wt 172.8 lb

## 2022-12-03 DIAGNOSIS — R7989 Other specified abnormal findings of blood chemistry: Secondary | ICD-10-CM | POA: Insufficient documentation

## 2022-12-03 DIAGNOSIS — Z01818 Encounter for other preprocedural examination: Secondary | ICD-10-CM | POA: Diagnosis not present

## 2022-12-03 HISTORY — DX: Fibromyalgia: M79.7

## 2022-12-03 HISTORY — DX: Personal history of urinary calculi: Z87.442

## 2022-12-03 LAB — BASIC METABOLIC PANEL
Anion gap: 10 (ref 5–15)
BUN: 15 mg/dL (ref 8–23)
CO2: 24 mmol/L (ref 22–32)
Calcium: 9.2 mg/dL (ref 8.9–10.3)
Chloride: 100 mmol/L (ref 98–111)
Creatinine, Ser: 0.81 mg/dL (ref 0.61–1.24)
GFR, Estimated: >60 mL/min (ref >60)
Glucose, Bld: 94 mg/dL (ref 70–99)
Potassium: 4.1 mmol/L (ref 3.5–5.1)
Sodium: 134 mmol/L — ABNORMAL LOW (ref 135–145)

## 2022-12-03 LAB — CBC
HCT: 42.2 % (ref 39.0–52.0)
Hemoglobin: 14.1 g/dL (ref 13.0–17.0)
MCH: 32.3 pg (ref 26.0–34.0)
MCHC: 33.4 g/dL (ref 30.0–36.0)
MCV: 96.8 fL (ref 80.0–100.0)
Platelets: 236 10*3/uL (ref 150–400)
RBC: 4.36 MIL/uL (ref 4.22–5.81)
RDW: 12.8 % (ref 11.5–15.5)
WBC: 7.4 10*3/uL (ref 4.0–10.5)
nRBC: 0 % (ref 0.0–0.2)

## 2022-12-03 LAB — SURGICAL PCR SCREEN
MRSA, PCR: NEGATIVE
Staphylococcus aureus: NEGATIVE

## 2022-12-03 NOTE — Progress Notes (Signed)
PCP - Tillman Abide Cardiologist -  Edd Fabian NP  PPM/ICD - Denies Device Orders - n/a Rep Notified - n/a  Chest x-ray - 03-30-22 EKG - 09-27-22 Stress Test - deneis ECHO - 10-13-22 Cardiac Cath - denies  Sleep Study - Denies  DM- Denies  Blood Thinner Instructions: Eliquis stop 3 days prior to procedure. Last dose 12-09-22 Aspirin Instructions: n/a  ERAS Protcol - NPO   COVID TEST- n/a   Anesthesia review: yes  HTN A fib  Patient denies shortness of breath, fever, cough and chest pain at PAT appointment   All instructions explained to the patient, with a verbal understanding of the material. Patient agrees to go over the instructions while at home for a better understanding. Patient also instructed to self quarantine after being tested for COVID-19. The opportunity to ask questions was provided.

## 2022-12-07 NOTE — Progress Notes (Signed)
Anesthesia Chart Review:  Follows with cardiology for hx of HTN and chronic afib on Eliquis. Clearance per telephone encounter 11/19/22 by Edd Fabian, NP, "Chart reviewed as part of pre-operative protocol coverage. Given past medical history and time since last visit, based on ACC/AHA guidelines, Rodney Gray would be at acceptable risk for the planned procedure without further cardiovascular testing. His RCRI is a class II risk, 0.9% risk of major cardiac event. Patient with diagnosis of atrial fibrillation on Eliquis for anticoagulation.Marland KitchenMarland KitchenPer office protocol, patient can hold Eliquis for 3 days prior to procedure.  Patient will not need bridging with Lovenox (enoxaparin) around procedure."  History of polymyalgia rheumatica, maintained on prednisone.  Preop labs reviewed, mild hyponatremia sodium 134, otherwise WNL.  EKG 09/27/2022: A-fib with controlled ventricular response.  Rate 82.  TTE 10/13/22:  1. Left ventricular ejection fraction, by estimation, is 60 to 65%. The  left ventricle has normal function. The left ventricle has no regional  wall motion abnormalities. Left ventricular diastolic parameters are  indeterminate.   2. Right ventricular systolic function is normal. The right ventricular  size is normal. There is normal pulmonary artery systolic pressure. The  estimated right ventricular systolic pressure is 33.1 mmHg.   3. Left atrial size was severely dilated.   4. Right atrial size was severely dilated.   5. The mitral valve is normal in structure. Moderate mitral valve  regurgitation. No evidence of mitral stenosis.   6. The aortic valve is normal in structure. Aortic valve regurgitation is  mild. No aortic stenosis is present. Aortic valve mean gradient measures  4.6 mmHg.   7. The inferior vena cava is normal in size with greater than 50%  respiratory variability, suggesting right atrial pressure of 3 mmHg.     Zannie Cove [facility] Short Stay  Center/Anesthesiology Phone [phone] 12/07/2022 3:16 PM

## 2022-12-07 NOTE — Anesthesia Preprocedure Evaluation (Addendum)
Anesthesia Evaluation  Patient identified by MRN, date of birth, ID band Patient awake    Reviewed: Allergy & Precautions, H&P , NPO status , Patient's Chart, lab work & pertinent test results  Airway Mallampati: II  TM Distance: >3 FB Neck ROM: Full    Dental no notable dental hx. (+) Teeth Intact, Dental Advisory Given   Pulmonary neg pulmonary ROS   Pulmonary exam normal breath sounds clear to auscultation       Cardiovascular hypertension, Pt. on medications + dysrhythmias Atrial Fibrillation  Rhythm:Regular Rate:Normal     Neuro/Psych negative neurological ROS  negative psych ROS   GI/Hepatic Neg liver ROS,GERD  Medicated,,  Endo/Other  negative endocrine ROS    Renal/GU negative Renal ROS  negative genitourinary   Musculoskeletal  (+) Arthritis , Osteoarthritis,  Fibromyalgia -  Abdominal   Peds  Hematology negative hematology ROS (+)   Anesthesia Other Findings   Reproductive/Obstetrics negative OB ROS                             Anesthesia Physical Anesthesia Plan  ASA: 3  Anesthesia Plan: General   Post-op Pain Management: Tylenol PO (pre-op)*   Induction: Intravenous  PONV Risk Score and Plan: 3 and Ondansetron, Dexamethasone and Treatment may vary due to age or medical condition  Airway Management Planned: Oral ETT and Video Laryngoscope Planned  Additional Equipment:   Intra-op Plan:   Post-operative Plan: Extubation in OR  Informed Consent: I have reviewed the patients History and Physical, chart, labs and discussed the procedure including the risks, benefits and alternatives for the proposed anesthesia with the patient or authorized representative who has indicated his/her understanding and acceptance.     Dental advisory given  Plan Discussed with: CRNA  Anesthesia Plan Comments: (PAT note by Antionette Poles, PA-C: Follows with cardiology for hx of HTN and  chronic afib on Eliquis. Clearance per telephone encounter 11/19/22 by Edd Fabian, NP, "Chart reviewed as part of pre-operative protocol coverage. Given past medical history and time since last visit, based on ACC/AHA guidelines, Rodney Gray would be at acceptable risk for the planned procedure without further cardiovascular testing. His RCRI is a class II risk, 0.9% risk of major cardiac event. Patient with diagnosis of atrial fibrillation on Eliquis for anticoagulation.Marland KitchenMarland KitchenPer office protocol, patient can hold Eliquis for 3 days prior to procedure.  Patient will not need bridging with Lovenox (enoxaparin) around procedure."  History of polymyalgia rheumatica, maintained on prednisone.  Preop labs reviewed, mild hyponatremia sodium 134, otherwise WNL.  EKG 09/27/2022: A-fib with controlled ventricular response.  Rate 82.  TTE 10/13/22: 1. Left ventricular ejection fraction, by estimation, is 60 to 65%. The  left ventricle has normal function. The left ventricle has no regional  wall motion abnormalities. Left ventricular diastolic parameters are  indeterminate.  2. Right ventricular systolic function is normal. The right ventricular  size is normal. There is normal pulmonary artery systolic pressure. The  estimated right ventricular systolic pressure is 33.1 mmHg.  3. Left atrial size was severely dilated.  4. Right atrial size was severely dilated.  5. The mitral valve is normal in structure. Moderate mitral valve  regurgitation. No evidence of mitral stenosis.  6. The aortic valve is normal in structure. Aortic valve regurgitation is  mild. No aortic stenosis is present. Aortic valve mean gradient measures  4.6 mmHg.  7. The inferior vena cava is normal in size with greater than  50%  respiratory variability, suggesting right atrial pressure of 3 mmHg.   )        Anesthesia Quick Evaluation

## 2022-12-08 ENCOUNTER — Ambulatory Visit: Payer: Medicare Other | Admitting: Urology

## 2022-12-09 ENCOUNTER — Ambulatory Visit
Admission: RE | Admit: 2022-12-09 | Discharge: 2022-12-09 | Disposition: A | Discharge status: Home / Self Care | Payer: Medicare Other | Attending: Urology | Admitting: Urology

## 2022-12-09 ENCOUNTER — Encounter: Payer: Self-pay | Admitting: Urology

## 2022-12-09 ENCOUNTER — Ambulatory Visit (INDEPENDENT_AMBULATORY_CARE_PROVIDER_SITE_OTHER): Payer: Medicare Other | Admitting: Urology

## 2022-12-09 ENCOUNTER — Ambulatory Visit
Admission: RE | Admit: 2022-12-09 | Discharge: 2022-12-09 | Disposition: A | Discharge status: Home / Self Care | Payer: Medicare Other | Source: Ambulatory Visit | Attending: Urology | Admitting: Urology

## 2022-12-09 VITALS — BP 133/66 | HR 53 | Ht 67.5 in | Wt 182.4 lb

## 2022-12-09 DIAGNOSIS — Z87442 Personal history of urinary calculi: Secondary | ICD-10-CM

## 2022-12-09 DIAGNOSIS — N529 Male erectile dysfunction, unspecified: Secondary | ICD-10-CM

## 2022-12-09 DIAGNOSIS — I878 Other specified disorders of veins: Secondary | ICD-10-CM | POA: Diagnosis not present

## 2022-12-09 DIAGNOSIS — N200 Calculus of kidney: Secondary | ICD-10-CM

## 2022-12-09 DIAGNOSIS — N3281 Overactive bladder: Secondary | ICD-10-CM

## 2022-12-09 MED ORDER — TADALAFIL 10 MG PO TABS: 10.0000 mg | ORAL_TABLET | Freq: Every day | ORAL | 4 refills | Status: DC

## 2022-12-09 NOTE — Patient Instructions (Addendum)
Core Strength Exercises Ask your health care provider which exercises are safe for you. Do exercises exactly as told by your health care provider and adjust them as directed. It is normal to feel mild stretching, pulling, tightness, or discomfort as you do these exercises. Stop right away if you feel sudden pain or your pain gets worse. Do not begin these exercises until told by your health care provider. Benefits of core strength exercises Core exercises help to build strength in the muscles between your ribs and your hips (abdominal muscles). These muscles help to support your body and keep your spine stable. It is important to maintain strength in your core to prevent injury and pain. Some activities, such as yoga and Pilates, can help to strengthen core muscles. You can also strengthen core muscles with exercises at home. It is important to talk to your health care provider before you start a new exercise routine. Core strength exercises can: Reduce back pain. Help to rebuild strength after a back or spine injury. Help to prevent injury during physical activity, especially injuries to the back, hips, and knees. How to do core strength exercises Repeat these exercises 10-15 times, or until you are tired. Stop if you feel any pain while doing these exercises. Contact your health care provider if your pain continues or gets worse while doing or after doing core strength exercises. For strength exercises that are done on the floor, use a padded yoga mat or an exercise mat. Bridging  Lie on your back on a firm surface with your knees bent and your feet flat on the floor. Raise your hips so that your knees, hips, and shoulders together form a straight line. Do not excessively arch your back. Keep your abdominal muscles tight. Hold this position for 3-5 seconds. Slowly lower your hips to the starting position. Let your muscles relax completely between repetitions. Single-leg bridge  Lie on your  back on a firm surface with your knees bent and your feet flat on the floor. Raise your hips so that your knees, hips, and shoulders together form a straight line. Do not excessively arch your back. Keep your abdominal muscles tight. Lift one foot off the floor while maintaining alignment in your knees, hips, and shoulders. Then, completely straighten the lifted leg. Hold this position for 3-5 seconds. Put the straight leg back down in the bent position. Slowly lower your hips to the starting position. Repeat these steps using your other leg. Side bridge  Lie on your side with your knees bent. Prop yourself up on the elbow that is near the floor. Using your abdominal muscles and the elbow you are propped up on, raise your body off the floor. Raise your hip so that your shoulder, hip, and foot together form a straight line. Hold this position for 10 seconds. Keep your head and neck raised and away from your shoulder (in their normal, neutral position). Keep your abdominal muscles tight. Slowly lower your hip to the starting position. Repeat and try to hold this position longer, working your way up to 30 seconds. Abdominal crunch  Lie on your back on a firm surface. Bend your knees and keep your feet flat on the floor. Cross your arms over your chest. Without bending your neck, tip your chin slightly toward your chest. Tighten your abdominal muscles as you lift your chest just high enough to lift your shoulder blades off the floor. Do not hold your breath. You can do this with short lifts or  long lifts. Slowly return to the starting position. Bird dog  Get on your hands and knees, with your legs shoulder-width apart and your arms under your shoulders. Keep your back straight. Tighten your abdominal muscles. Raise one of your legs off the floor and straighten it. Try to keep it parallel to the floor. Slowly lower your leg to the starting position. Raise one of your arms off the floor and  straighten it. Try to keep it parallel to the floor. Slowly lower your arm to the starting position. Repeat with the other arm and leg. If possible, try raising a leg and an arm at the same time, on opposite sides of the body. For example, raise your left hand and your right leg. Raford Pitcher on your belly. Prop up your body onto your forearms and your feet, keeping your legs straight. Your body should make a straight line between your shoulders and feet. Hold this position for 10 seconds while keeping your abdominal muscles tight. Lower your body to the starting position. Repeat and try to hold this position longer, working your way up to 30 seconds. Cross-core strengthening  Stand with your feet shoulder-width apart. Hold a ball out in front of you. Keep your arms straight. Tighten your abdominal muscles and slowly rotate at your waist from side to side. Keep your feet flat. Once you are comfortable, try repeating this exercise with a heavier ball. Top core strengthening  Stand about 18 inches (46 cm) out from a wall, with your back to the wall. Keep your feet flat and shoulder-width apart. Tighten your abdominal muscles. Bend your hips and knees. Slowly reach between your legs to touch the wall behind you. Slowly stand back up. Raise your arms over your head and reach behind you. Return to the starting position. General tips Do not do any exercises that cause pain. If you have pain while exercising, talk to your health care provider. Always stretch before and after doing these exercises. This can help prevent injury. Maintain a healthy weight. Ask your health care provider what weight is healthy for you. Contact a health care provider if: You have back pain that gets worse or does not go away. You feel pain while doing core strength exercises. Get help right away if: You have severe pain that does not get better with medicine. Summary Core exercises help to build strength in the  muscles between your ribs and your waist. Core muscles help to support your body and keep your spine stable. Some activities, such as yoga and Pilates, can help to strengthen core muscles. Core strength exercises can help back pain and can prevent injury. Stop if you feel any pain while doing core strength exercises. This information is not intended to replace advice given to you by your health care provider. Make sure you discuss any questions you have with your health care provider. Document Revised: 09/16/2020 Document Reviewed: 12/18/2019 Elsevier Patient Education  2024 [employer].

## 2022-12-09 NOTE — Progress Notes (Signed)
   12/09/2022 9:30 AM   Rodney Gray Apr 14, 1950 [id number]  Reason for visit: Follow up ED, history of kidney stones  HPI: 72 year old male who I have followed for ED and nephrolithiasis.  He spontaneously passed a small kidney stone in September 2022, made some major diet changes and has not had any stone events since that time.  I personally viewed and interpreted the CT abdomen and pelvis from June 2024 as well as the KUB today that shows no evidence of stone disease or hydronephrosis.  In terms of his ED, he has been on Cialis 5 mg daily with a 5 mg boost dose as needed.  This works moderately well but he is interested in potentially increasing the dose.  I think would be very reasonable to take the 10 mg Cialis daily with a 10 mg boost dose as needed, and risks and benefits were discussed.  He had a normal testosterone in May 2023 at 509.  We discussed general stone prevention strategies including adequate hydration with goal of producing 2.5 L of urine daily, increasing citric acid intake, increasing calcium intake during high oxalate meals, minimizing animal protein, and decreasing salt intake. Information about dietary recommendations given today.   Finally, he has some intermittent right-sided groin pain, CT showed no evidence of hernia or other abnormalities.  Pelvic floor/groin stretching exercises provided.  Cialis dose increased to 10 mg daily with 10 mg boost dose as needed RTC 1 year symptom check, consider trial of sildenafil or other ED treatment options if no improvement on the Cialis  Sondra Come, MD  [facility] [address], [address] [place], [place] [address] [phone]

## 2022-12-13 ENCOUNTER — Other Ambulatory Visit: Payer: Self-pay

## 2022-12-13 ENCOUNTER — Inpatient Hospital Stay (HOSPITAL_COMMUNITY)
Admission: RE | Admit: 2022-12-13 | Discharge: 2022-12-13 | DRG: 473 | Disposition: A | Discharge status: Home / Self Care | Payer: Medicare Other | Attending: Neurosurgery | Admitting: Neurosurgery

## 2022-12-13 ENCOUNTER — Inpatient Hospital Stay (HOSPITAL_COMMUNITY): Payer: Medicare Other

## 2022-12-13 ENCOUNTER — Inpatient Hospital Stay (HOSPITAL_COMMUNITY): Payer: Medicare Other | Admitting: Physician Assistant

## 2022-12-13 ENCOUNTER — Encounter (HOSPITAL_COMMUNITY): Payer: Self-pay | Admitting: Neurosurgery

## 2022-12-13 ENCOUNTER — Inpatient Hospital Stay (HOSPITAL_COMMUNITY)
Admission: RE | Disposition: A | Discharge status: Home / Self Care | Payer: Self-pay | Source: Home / Self Care | Attending: Neurosurgery

## 2022-12-13 DIAGNOSIS — I10 Essential (primary) hypertension: Secondary | ICD-10-CM | POA: Diagnosis present

## 2022-12-13 DIAGNOSIS — M4802 Spinal stenosis, cervical region: Secondary | ICD-10-CM

## 2022-12-13 DIAGNOSIS — Z79899 Other long term (current) drug therapy: Secondary | ICD-10-CM | POA: Diagnosis not present

## 2022-12-13 DIAGNOSIS — M1990 Unspecified osteoarthritis, unspecified site: Secondary | ICD-10-CM | POA: Diagnosis not present

## 2022-12-13 DIAGNOSIS — N400 Enlarged prostate without lower urinary tract symptoms: Secondary | ICD-10-CM | POA: Diagnosis present

## 2022-12-13 DIAGNOSIS — Z83438 Family history of other disorder of lipoprotein metabolism and other lipidemia: Secondary | ICD-10-CM | POA: Diagnosis not present

## 2022-12-13 DIAGNOSIS — M797 Fibromyalgia: Secondary | ICD-10-CM | POA: Diagnosis present

## 2022-12-13 DIAGNOSIS — M4722 Other spondylosis with radiculopathy, cervical region: Secondary | ICD-10-CM | POA: Diagnosis present

## 2022-12-13 DIAGNOSIS — Z85828 Personal history of other malignant neoplasm of skin: Secondary | ICD-10-CM | POA: Diagnosis not present

## 2022-12-13 DIAGNOSIS — Z811 Family history of alcohol abuse and dependence: Secondary | ICD-10-CM

## 2022-12-13 DIAGNOSIS — I4891 Unspecified atrial fibrillation: Secondary | ICD-10-CM | POA: Diagnosis not present

## 2022-12-13 DIAGNOSIS — Z800 Family history of malignant neoplasm of digestive organs: Secondary | ICD-10-CM | POA: Diagnosis not present

## 2022-12-13 DIAGNOSIS — E785 Hyperlipidemia, unspecified: Secondary | ICD-10-CM | POA: Diagnosis present

## 2022-12-13 DIAGNOSIS — Z8249 Family history of ischemic heart disease and other diseases of the circulatory system: Secondary | ICD-10-CM | POA: Diagnosis not present

## 2022-12-13 DIAGNOSIS — K219 Gastro-esophageal reflux disease without esophagitis: Secondary | ICD-10-CM | POA: Diagnosis present

## 2022-12-13 DIAGNOSIS — Z981 Arthrodesis status: Secondary | ICD-10-CM | POA: Diagnosis not present

## 2022-12-13 DIAGNOSIS — M353 Polymyalgia rheumatica: Secondary | ICD-10-CM | POA: Diagnosis present

## 2022-12-13 DIAGNOSIS — Z7901 Long term (current) use of anticoagulants: Secondary | ICD-10-CM | POA: Diagnosis not present

## 2022-12-13 DIAGNOSIS — Z4682 Encounter for fitting and adjustment of non-vascular catheter: Secondary | ICD-10-CM | POA: Diagnosis not present

## 2022-12-13 DIAGNOSIS — Z7952 Long term (current) use of systemic steroids: Secondary | ICD-10-CM

## 2022-12-13 DIAGNOSIS — Z825 Family history of asthma and other chronic lower respiratory diseases: Secondary | ICD-10-CM

## 2022-12-13 HISTORY — PX: ANTERIOR CERVICAL DECOMP/DISCECTOMY FUSION: SHX1161

## 2022-12-13 SURGERY — ANTERIOR CERVICAL DECOMPRESSION/DISCECTOMY FUSION 2 LEVELS
Anesthesia: General

## 2022-12-13 MED ORDER — ALUM & MAG HYDROXIDE-SIMETH 200-200-20 MG/5ML PO SUSP: 30.0000 mL | Freq: Four times a day (QID) | ORAL | Status: DC | PRN

## 2022-12-13 MED ORDER — ACETAMINOPHEN 650 MG RE SUPP: 650.0000 mg | RECTAL | Status: DC | PRN

## 2022-12-13 MED ORDER — DEXAMETHASONE SODIUM PHOSPHATE 10 MG/ML IJ SOLN
INTRAMUSCULAR | Status: AC
  Filled 2022-12-13: qty 1

## 2022-12-13 MED ORDER — ROCURONIUM BROMIDE 10 MG/ML (PF) SYRINGE
PREFILLED_SYRINGE | INTRAVENOUS | Status: AC
  Filled 2022-12-13: qty 10

## 2022-12-13 MED ORDER — FENTANYL CITRATE (PF) 250 MCG/5ML IJ SOLN
INTRAMUSCULAR | Status: AC
  Filled 2022-12-13: qty 5

## 2022-12-13 MED ORDER — DEXAMETHASONE SODIUM PHOSPHATE 10 MG/ML IJ SOLN
INTRAMUSCULAR | Status: DC | PRN
  Administered 2022-12-13: 10 mg via INTRAVENOUS

## 2022-12-13 MED ORDER — ONDANSETRON HCL 4 MG/2ML IJ SOLN: 4.0000 mg | Freq: Four times a day (QID) | INTRAMUSCULAR | Status: DC | PRN

## 2022-12-13 MED ORDER — CHLORHEXIDINE GLUCONATE CLOTH 2 % EX PADS: 6.0000 | MEDICATED_PAD | Freq: Once | CUTANEOUS | Status: DC

## 2022-12-13 MED ORDER — METHOCARBAMOL 500 MG PO TABS
500.0000 mg | ORAL_TABLET | Freq: Four times a day (QID) | ORAL | Status: DC | PRN
  Administered 2022-12-13: 500 mg via ORAL
  Filled 2022-12-13: qty 1

## 2022-12-13 MED ORDER — THROMBIN 5000 UNITS EX SOLR
OROMUCOSAL | Status: DC | PRN
  Administered 2022-12-13: 5 mL via TOPICAL

## 2022-12-13 MED ORDER — LIDOCAINE 2% (20 MG/ML) 5 ML SYRINGE
INTRAMUSCULAR | Status: DC | PRN
  Administered 2022-12-13: 80 mg via INTRAVENOUS

## 2022-12-13 MED ORDER — VANCOMYCIN HCL IN DEXTROSE 1-5 GM/200ML-% IV SOLN
1000.0000 mg | INTRAVENOUS | Status: AC
  Administered 2022-12-13: 1000 mg via INTRAVENOUS
  Filled 2022-12-13: qty 200

## 2022-12-13 MED ORDER — PREDNISONE 5 MG PO TABS
5.0000 mg | ORAL_TABLET | ORAL | Status: DC
  Administered 2022-12-13: 5 mg via ORAL
  Filled 2022-12-13 (×2): qty 1

## 2022-12-13 MED ORDER — ACETAMINOPHEN 500 MG PO TABS
1000.0000 mg | ORAL_TABLET | Freq: Once | ORAL | Status: AC
  Administered 2022-12-13: 1000 mg via ORAL
  Filled 2022-12-13: qty 2

## 2022-12-13 MED ORDER — PANTOPRAZOLE SODIUM 40 MG IV SOLR: 40.0000 mg | Freq: Every day | INTRAVENOUS | Status: DC

## 2022-12-13 MED ORDER — TADALAFIL 5 MG PO TABS: 10.0000 mg | ORAL_TABLET | Freq: Every day | ORAL | Status: DC

## 2022-12-13 MED ORDER — LIDOCAINE 2% (20 MG/ML) 5 ML SYRINGE
INTRAMUSCULAR | Status: AC
  Filled 2022-12-13: qty 5

## 2022-12-13 MED ORDER — HYDROCODONE-ACETAMINOPHEN 5-325 MG PO TABS
2.0000 | ORAL_TABLET | ORAL | Status: DC | PRN
  Administered 2022-12-13: 2 via ORAL
  Filled 2022-12-13: qty 2

## 2022-12-13 MED ORDER — ONDANSETRON HCL 4 MG PO TABS: 4.0000 mg | ORAL_TABLET | Freq: Four times a day (QID) | ORAL | Status: DC | PRN

## 2022-12-13 MED ORDER — PANTOPRAZOLE SODIUM 40 MG PO TBEC
40.0000 mg | DELAYED_RELEASE_TABLET | Freq: Every day | ORAL | Status: DC
Start: 2022-12-13 — End: 2022-12-13

## 2022-12-13 MED ORDER — ONDANSETRON HCL 4 MG/2ML IJ SOLN
INTRAMUSCULAR | Status: DC | PRN
  Administered 2022-12-13: 4 mg via INTRAVENOUS

## 2022-12-13 MED ORDER — CYCLOBENZAPRINE HCL 10 MG PO TABS: 10.0000 mg | ORAL_TABLET | Freq: Three times a day (TID) | ORAL | Status: DC | PRN

## 2022-12-13 MED ORDER — PREDNISONE 1 MG PO TABS: 1.0000 mg | ORAL_TABLET | ORAL | Status: DC

## 2022-12-13 MED ORDER — HYDROMORPHONE HCL 1 MG/ML IJ SOLN: 0.5000 mg | INTRAMUSCULAR | Status: DC | PRN

## 2022-12-13 MED ORDER — CEFAZOLIN SODIUM 1 G IJ SOLR
INTRAMUSCULAR | Status: AC
  Filled 2022-12-13: qty 20

## 2022-12-13 MED ORDER — 0.9 % SODIUM CHLORIDE (POUR BTL) OPTIME
TOPICAL | Status: DC | PRN
  Administered 2022-12-13 (×2): 1000 mL

## 2022-12-13 MED ORDER — THROMBIN (RECOMBINANT) 5000 UNITS EX SOLR
CUTANEOUS | Status: DC | PRN
  Administered 2022-12-13: 10 mL via TOPICAL

## 2022-12-13 MED ORDER — THROMBIN 5000 UNITS EX SOLR
CUTANEOUS | Status: AC
  Filled 2022-12-13: qty 15000

## 2022-12-13 MED ORDER — PROPOFOL 10 MG/ML IV BOLUS
INTRAVENOUS | Status: AC
  Filled 2022-12-13: qty 20

## 2022-12-13 MED ORDER — DILTIAZEM HCL ER COATED BEADS 180 MG PO CP24: 180.0000 mg | ORAL_CAPSULE | Freq: Every day | ORAL | Status: DC

## 2022-12-13 MED ORDER — MIDAZOLAM HCL 2 MG/2ML IJ SOLN
INTRAMUSCULAR | Status: AC
  Filled 2022-12-13: qty 2

## 2022-12-13 MED ORDER — MENTHOL 3 MG MT LOZG: 1.0000 | LOZENGE | OROMUCOSAL | Status: DC | PRN

## 2022-12-13 MED ORDER — ONDANSETRON HCL 4 MG/2ML IJ SOLN
INTRAMUSCULAR | Status: AC
  Filled 2022-12-13: qty 2

## 2022-12-13 MED ORDER — PROPOFOL 10 MG/ML IV BOLUS
INTRAVENOUS | Status: DC | PRN
  Administered 2022-12-13: 130 mg via INTRAVENOUS

## 2022-12-13 MED ORDER — SUGAMMADEX SODIUM 200 MG/2ML IV SOLN
INTRAVENOUS | Status: DC | PRN
  Administered 2022-12-13: 200 mg via INTRAVENOUS

## 2022-12-13 MED ORDER — PHENYLEPHRINE HCL-NACL 20-0.9 MG/250ML-% IV SOLN
INTRAVENOUS | Status: DC | PRN
  Administered 2022-12-13: 30 ug/min via INTRAVENOUS

## 2022-12-13 MED ORDER — SODIUM CHLORIDE 0.9% FLUSH: 3.0000 mL | INTRAVENOUS | Status: DC | PRN

## 2022-12-13 MED ORDER — CHLORHEXIDINE GLUCONATE 0.12 % MT SOLN
15.0000 mL | Freq: Once | OROMUCOSAL | Status: AC
  Administered 2022-12-13: 15 mL via OROMUCOSAL
  Filled 2022-12-13: qty 15

## 2022-12-13 MED ORDER — ROCURONIUM BROMIDE 10 MG/ML (PF) SYRINGE
PREFILLED_SYRINGE | INTRAVENOUS | Status: DC | PRN
  Administered 2022-12-13: 20 mg via INTRAVENOUS
  Administered 2022-12-13: 60 mg via INTRAVENOUS
  Administered 2022-12-13: 20 mg via INTRAVENOUS

## 2022-12-13 MED ORDER — MONTELUKAST SODIUM 10 MG PO TABS: 10.0000 mg | ORAL_TABLET | ORAL | Status: DC | PRN

## 2022-12-13 MED ORDER — METHOCARBAMOL 500 MG PO TABS: 500.0000 mg | ORAL_TABLET | Freq: Every evening | ORAL | 1 refills | Status: DC

## 2022-12-13 MED ORDER — FENTANYL CITRATE (PF) 250 MCG/5ML IJ SOLN
INTRAMUSCULAR | Status: DC | PRN
  Administered 2022-12-13: 50 ug via INTRAVENOUS
  Administered 2022-12-13: 100 ug via INTRAVENOUS

## 2022-12-13 MED ORDER — CEFAZOLIN SODIUM-DEXTROSE 1-4 GM/50ML-% IV SOLN
INTRAVENOUS | Status: DC | PRN
Start: 2022-12-13 — End: 2022-12-13
  Administered 2022-12-13: 2 g via INTRAVENOUS

## 2022-12-13 MED ORDER — ORAL CARE MOUTH RINSE: 15.0000 mL | Freq: Once | OROMUCOSAL | Status: AC

## 2022-12-13 MED ORDER — SODIUM CHLORIDE 0.9% FLUSH: 3.0000 mL | Freq: Two times a day (BID) | INTRAVENOUS | Status: DC

## 2022-12-13 MED ORDER — PHENOL 1.4 % MT LIQD
1.0000 | OROMUCOSAL | Status: DC | PRN
  Administered 2022-12-13: 1 via OROMUCOSAL
  Filled 2022-12-13: qty 177

## 2022-12-13 MED ORDER — LACTATED RINGERS IV SOLN: INTRAVENOUS | Status: DC

## 2022-12-13 MED ORDER — HYDROMORPHONE HCL 1 MG/ML IJ SOLN: 0.2500 mg | INTRAMUSCULAR | Status: DC | PRN

## 2022-12-13 MED ORDER — CEFAZOLIN SODIUM-DEXTROSE 2-4 GM/100ML-% IV SOLN
2.0000 g | Freq: Three times a day (TID) | INTRAVENOUS | Status: DC
  Administered 2022-12-13: 2 g via INTRAVENOUS
  Filled 2022-12-13: qty 100

## 2022-12-13 MED ORDER — ACETAMINOPHEN 325 MG PO TABS: 650.0000 mg | ORAL_TABLET | ORAL | Status: DC | PRN

## 2022-12-13 MED ORDER — PREDNISONE 5 MG PO TABS: 5.0000 mg | ORAL_TABLET | ORAL | Status: DC

## 2022-12-13 MED ORDER — SODIUM CHLORIDE 0.9 % IV SOLN
250.0000 mL | INTRAVENOUS | Status: DC
  Administered 2022-12-13: 250 mL via INTRAVENOUS

## 2022-12-13 MED ORDER — PREDNISONE 1 MG PO TABS
1.0000 mg | ORAL_TABLET | ORAL | Status: DC
  Administered 2022-12-13: 1 mg via ORAL
  Filled 2022-12-13 (×2): qty 1

## 2022-12-13 MED ORDER — MIDAZOLAM HCL 2 MG/2ML IJ SOLN
INTRAMUSCULAR | Status: DC | PRN
  Administered 2022-12-13: 2 mg via INTRAVENOUS

## 2022-12-13 SURGICAL SUPPLY — 56 items
ADH SKN CLS APL DERMABOND .7 (GAUZE/BANDAGES/DRESSINGS) ×1
APL SKNCLS STERI-STRIP NONHPOA (GAUZE/BANDAGES/DRESSINGS) ×1
BAG COUNTER SPONGE SURGICOUNT (BAG) ×2 IMPLANT
BAG SPNG CNTER NS LX DISP (BAG) ×1
BASKET BONE COLLECTION (BASKET) ×2 IMPLANT
BENZOIN TINCTURE PRP APPL 2/3 (GAUZE/BANDAGES/DRESSINGS) ×2 IMPLANT
BIT DRILL NEURO 2X3.1 SFT TUCH (MISCELLANEOUS) ×2 IMPLANT
BONE VIVIGEN FORMABLE 1.3CC (Bone Implant) ×1 IMPLANT
BUR MATCHSTICK NEURO 3.0 LAGG (BURR) ×2 IMPLANT
CANISTER SUCT 3000ML PPV (MISCELLANEOUS) ×2 IMPLANT
DERMABOND ADVANCED .7 DNX12 (GAUZE/BANDAGES/DRESSINGS) IMPLANT
DRAPE C-ARM 42X72 X-RAY (DRAPES) ×4 IMPLANT
DRAPE LAPAROTOMY 100X72 PEDS (DRAPES) ×2 IMPLANT
DRAPE MICROSCOPE SLANT 54X150 (MISCELLANEOUS) ×2 IMPLANT
DRILL NEURO 2X3.1 SOFT TOUCH (MISCELLANEOUS) ×1
DRSG OPSITE POSTOP 4X6 (GAUZE/BANDAGES/DRESSINGS) IMPLANT
DURAPREP 6ML APPLICATOR 50/CS (WOUND CARE) ×2 IMPLANT
ELECT COATED BLADE 2.86 ST (ELECTRODE) ×2 IMPLANT
ELECT REM PT RETURN 9FT ADLT (ELECTROSURGICAL) ×1
ELECTRODE REM PT RTRN 9FT ADLT (ELECTROSURGICAL) ×2 IMPLANT
GAUZE 4X4 16PLY ~~LOC~~+RFID DBL (SPONGE) IMPLANT
GAUZE SPONGE 4X4 12PLY STRL (GAUZE/BANDAGES/DRESSINGS) ×2 IMPLANT
GLOVE BIO SURGEON STRL SZ7 (GLOVE) IMPLANT
GLOVE BIO SURGEON STRL SZ8 (GLOVE) ×2 IMPLANT
GLOVE BIOGEL PI IND STRL 7.0 (GLOVE) IMPLANT
GLOVE EXAM NITRILE XL STR (GLOVE) IMPLANT
GLOVE INDICATOR 8.5 STRL (GLOVE) ×4 IMPLANT
GOWN STRL REUS W/ TWL LRG LVL3 (GOWN DISPOSABLE) IMPLANT
GOWN STRL REUS W/ TWL XL LVL3 (GOWN DISPOSABLE) ×2 IMPLANT
GOWN STRL REUS W/TWL 2XL LVL3 (GOWN DISPOSABLE) IMPLANT
GOWN STRL REUS W/TWL LRG LVL3 (GOWN DISPOSABLE) ×1
GOWN STRL REUS W/TWL XL LVL3 (GOWN DISPOSABLE) ×1
GRAFT BNE MATRIX VG FRMBL SM 1 (Bone Implant) IMPLANT
HALTER HD/CHIN CERV TRACTION D (MISCELLANEOUS) ×2 IMPLANT
HEMOSTAT POWDER KIT SURGIFOAM (HEMOSTASIS) ×2 IMPLANT
KIT BASIN OR (CUSTOM PROCEDURE TRAY) ×2 IMPLANT
KIT TURNOVER KIT B (KITS) ×2 IMPLANT
NDL SPNL 20GX3.5 QUINCKE YW (NEEDLE) ×2 IMPLANT
NEEDLE SPNL 20GX3.5 QUINCKE YW (NEEDLE) ×1 IMPLANT
NS IRRIG 1000ML POUR BTL (IV SOLUTION) ×2 IMPLANT
PACK LAMINECTOMY NEURO (CUSTOM PROCEDURE TRAY) ×2 IMPLANT
PAD ARMBOARD 7.5X6 YLW CONV (MISCELLANEOUS) ×6 IMPLANT
PIN DISTRACTION 14MM (PIN) IMPLANT
PLATE CERV RES 32 2LVL (Plate) IMPLANT
SCREW VA SD 4.2X15 (Screw) IMPLANT
SPACER HEDRON 14X16X6 0D (Spacer) IMPLANT
SPACER HEDRON 14X16X7 0D (Spacer) IMPLANT
SPONGE INTESTINAL PEANUT (DISPOSABLE) ×2 IMPLANT
SPONGE SURGIFOAM ABS GEL SZ50 (HEMOSTASIS) ×2 IMPLANT
STRIP CLOSURE SKIN 1/2X4 (GAUZE/BANDAGES/DRESSINGS) ×2 IMPLANT
SUT VIC AB 3-0 SH 8-18 (SUTURE) ×2 IMPLANT
SUT VICRYL 4-0 PS2 18IN ABS (SUTURE) ×2 IMPLANT
TAPE CLOTH 4X10 WHT NS (GAUZE/BANDAGES/DRESSINGS) ×2 IMPLANT
TOWEL GREEN STERILE (TOWEL DISPOSABLE) ×2 IMPLANT
TOWEL GREEN STERILE FF (TOWEL DISPOSABLE) ×2 IMPLANT
WATER STERILE IRR 1000ML POUR (IV SOLUTION) ×2 IMPLANT

## 2022-12-13 NOTE — Group Note (Deleted)

## 2022-12-13 NOTE — H&P (Signed)
Rodney Gray is an 72 y.o. male.   Chief Complaint: Neck and right arm pain HPI: 72 year old gentleman with progressive worsening neck right shoulder and arm pain workup is revealed some intrinsic pathology in the shoulder itself and also severe cervical spondylosis with foraminal stenosis right greater than left at C5-6 and C6-7.  Due to the patient's progression of clinical syndrome imaging findings of a conservative treatment I recommended anterior cervical discectomies and fusion at those 2 levels.  I have extensively reviewed the risks and benefits of the operation with the patient as well as perioperative course expectations of outcome and alternatives of surgery and he understands and agrees to proceed forward.  Past Medical History:  Diagnosis Date   Allergic rhinitis due to pollen    Atrial fibrillation (HCC)    A-Fib, 1987, 2003, 2005, EPS RX 2005 (A-Fib reocurred)   Atrial fibrillation (HCC)    Basal cell carcinoma    skin CA- squamous and Basal cell   BPH (benign prostatic hypertrophy)    Cataract    Fibromyalgia    History of kidney stones    kidney stones 2 years ago   Hyperlipemia    no per pt   Hypertension    no per pt   Kidney stone    Osteoarthritis    Polymyalgia rheumatica (HCC)    Squamous cell carcinoma in situ     Past Surgical History:  Procedure Laterality Date   CAPSULOTOMY Bilateral 08/2007   CATARACT EXTRACTION Bilateral    both eyes   COLONOSCOPY     MENISCUS REPAIR Left 09/2009   left knee---[facility]   NASAL SINUS SURGERY  May 2014   RETINAL TEAR REPAIR CRYOTHERAPY Right    right 12/06   RETINAL TEAR REPAIR CRYOTHERAPY Right    Retinal tear/bleed- Right 12/06   SQUAMOUS CELL CARCINOMA EXCISION Left 07/2008   left forearm   THUMB ARTHROSCOPY Left 1999    Family History  Problem Relation Age of Onset   Hypertension Father    Cirrhosis Father 49   Alcohol abuse Father    Gallstones Mother    Hyperlipidemia Mother    Esophageal  cancer Brother    Asthma Daughter    Coronary artery disease Neg Hx    Diabetes Neg Hx    Cancer Neg Hx        prostate or colon   Colon cancer Neg Hx    Rectal cancer Neg Hx    Stomach cancer Neg Hx    Social History:  reports that he has never smoked. He has never been exposed to tobacco smoke. He has never used smokeless tobacco. He reports current alcohol use. He reports that he does not use drugs.  Allergies:  Allergies  Allergen Reactions   Ampicillin Hives    Has tolerated cephalosporins    Medications Prior to Admission  Medication Sig Dispense Refill   acetaminophen (TYLENOL) 650 MG CR tablet Take 650 mg by mouth at bedtime as needed for pain.     ARTIFICIAL TEAR SOLUTION OP Place 1 drop into both eyes 3 (three) times daily.     diclofenac Sodium (VOLTAREN) 1 % GEL Apply 1 Application topically 4 (four) times daily as needed (pain).     diltiazem (CARDIZEM CD) 180 MG 24 hr capsule TAKE 1 CAPSULE BY MOUTH EVERY DAY 90 capsule 0   ELIQUIS 5 MG TABS tablet TAKE 1 TABLET BY MOUTH TWICE  DAILY 180 tablet 3   methocarbamol (ROBAXIN) 500  MG tablet Take 1 tablet (500 mg total) by mouth at bedtime. 30 tablet 1   Multiple Vitamins-Minerals (MENS MULTIVITAMIN PLUS PO) Take 1 capsule by mouth daily.     Omega-3 Fatty Acids (FISH OIL) 1200 MG CAPS Take 1 capsule by mouth daily.     omeprazole (PRILOSEC) 20 MG capsule TAKE 1 CAPSULE BY MOUTH AT BEDTIME. (Patient taking differently: Take 20 mg by mouth every other day. At night) 90 capsule 3   predniSONE (DELTASONE) 1 MG tablet Take 1 tablet (1 mg total) by mouth every other day. Along with a 5 mg tab 45 tablet 3   predniSONE (DELTASONE) 5 MG tablet Take 1-2 tablets (5-10 mg total) by mouth every other day. Wean as directed (Patient taking differently: Take 5 mg by mouth every other day. Wean as directed) 60 tablet 11   clindamycin (CLEOCIN) 150 MG capsule Take 4 capsules by mouth as needed. Prior to dental appointments     montelukast  (SINGULAIR) 10 MG tablet TAKE 1 TABLET (10 MG TOTAL) BY MOUTH AS NEEDED. ALLERGIES 90 tablet 1   tadalafil (CIALIS) 10 MG tablet Take 1 tablet (10 mg total) by mouth daily. OK to take additional 10mg  boost dose as needed 45 minutes prior to sexual activity 90 tablet 4    No results found for this or any previous visit (from the past 48 hour(s)). No results found.  Review of Systems  Musculoskeletal:  Positive for neck pain.  Neurological:  Positive for numbness.    Blood pressure (!) 142/91, pulse (!) 57, temperature 97.7 F (36.5 C), temperature source Oral, resp. rate 17, height 5' 7.5" (1.715 m), weight 82.6 kg, SpO2 97%. Physical Exam HENT:     Head: Normocephalic.     Right Ear: Tympanic membrane normal.     Nose: Nose normal.     Mouth/Throat:     Mouth: Mucous membranes are moist.  Eyes:     Pupils: Pupils are equal, round, and reactive to light.  Cardiovascular:     Rate and Rhythm: Normal rate.     Pulses: Normal pulses.  Pulmonary:     Effort: Pulmonary effort is normal.  Abdominal:     General: Abdomen is flat.  Neurological:     Mental Status: He is alert.     Comments: Strength is 5 of 5 deltoid, bicep, tricep, wrist flexion, wrist extension, hand intrinsics.      Assessment/Plan 72 year old gentleman presents for ACDF C5-6 C6-7.  Mariam Dollar, MD 12/13/2022, 7:26 AM

## 2022-12-13 NOTE — Evaluation (Signed)
Occupational Therapy Evaluation Patient Details Name: Rodney Gray MRN: [id number] DOB: 1950-05-13 Today's Date: 12/13/2022   History of Present Illness 72 yo M s/p ACDF.  PMH includes: A-fib, arthritis.   Clinical Impression   Patient s/p procedure above.  PTA he remains very active, needing no assist for any aspect of mobility or care.  Patient has a very good understanding of precautions, and is essentially at his baseline for mobility and ADL completion.  Patient will have assist as needed via his spouse, and no further OT needs exist in the acute setting.  Recommend follow up as prescribed by MD.         If plan is discharge home, recommend the following: Assist for transportation    Functional Status Assessment  Patient has not had a recent decline in their functional status  Equipment Recommendations  None recommended by OT    Recommendations for Other Services       Precautions / Restrictions Precautions Precautions: Cervical Precaution Booklet Issued: Yes (comment) Required Braces or Orthoses: Cervical Brace Cervical Brace: Soft collar;For comfort Restrictions Weight Bearing Restrictions: No      Mobility Bed Mobility                    Transfers Overall transfer level: Independent                        Balance Overall balance assessment: Independent                                         ADL either performed or assessed with clinical judgement   ADL Overall ADL's : At baseline                                             Vision Baseline Vision/History: 0 No visual deficits Patient Visual Report: No change from baseline       Perception Perception: Within Functional Limits       Praxis Praxis: WFL       Pertinent Vitals/Pain Pain Assessment Pain Assessment: Faces Faces Pain Scale: Hurts little more Pain Location: Incisional Pain Descriptors / Indicators: Aching Pain  Intervention(s): Monitored during session     Extremity/Trunk Assessment Upper Extremity Assessment Upper Extremity Assessment: Overall WFL for tasks assessed   Lower Extremity Assessment Lower Extremity Assessment: Overall WFL for tasks assessed   Cervical / Trunk Assessment Cervical / Trunk Assessment: Neck Surgery   Communication Communication Communication: No apparent difficulties   Cognition Arousal: Alert Behavior During Therapy: WFL for tasks assessed/performed Overall Cognitive Status: Within Functional Limits for tasks assessed                                       General Comments   VSS on RA    Exercises     Shoulder Instructions      Home Living Family/patient expects to be discharged to:: Private residence Living Arrangements: Spouse/significant other Available Help at Discharge: Family;Available 24 hours/day Type of Home: House Home Access: Level entry     Home Layout: One level     Bathroom Shower/Tub: Producer, television/film/video: Standard Bathroom  Accessibility: Yes How Accessible: Accessible via walker Home Equipment: Shower seat - built in          Prior Functioning/Environment Prior Level of Function : Independent/Modified Independent;Driving                        OT Problem List: Pain      OT Treatment/Interventions:      OT Goals(Current goals can be found in the care plan section) Acute Rehab OT Goals Patient Stated Goal: Return home OT Goal Formulation: With patient Time For Goal Achievement: 12/17/22 Potential to Achieve Goals: Good  OT Frequency:      Co-evaluation              AM-PAC OT "6 Clicks" Daily Activity     Outcome Measure Help from another person eating meals?: None Help from another person taking care of personal grooming?: None Help from another person toileting, which includes using toliet, bedpan, or urinal?: None Help from another person bathing (including washing,  rinsing, drying)?: None Help from another person to put on and taking off regular upper body clothing?: None Help from another person to put on and taking off regular lower body clothing?: None 6 Click Score: 24   End of Session Nurse Communication: Mobility status  Activity Tolerance: Patient tolerated treatment well Patient left: in chair;with call bell/phone within reach  OT Visit Diagnosis: Unsteadiness on feet (R26.81)                Time: 1238-1300 OT Time Calculation (min): 22 min Charges:  OT General Charges $OT Visit: 1 Visit OT Evaluation $OT Eval Moderate Complexity: 1 Mod  12/13/2022  RP, OTR/L  Acute Rehabilitation Services  Office:  [phone]   Suzanna Obey 12/13/2022, 1:23 PM

## 2022-12-13 NOTE — Anesthesia Procedure Notes (Signed)
Procedure Name: Intubation Date/Time: 12/13/2022 7:42 AM  Performed by: Georgianne Fick D, CRNAPre-anesthesia Checklist: Patient identified, Emergency Drugs available, Suction available and Patient being monitored Patient Re-evaluated:Patient Re-evaluated prior to induction Oxygen Delivery Method: Circle System Utilized Preoxygenation: Pre-oxygenation with 100% oxygen Induction Type: IV induction Ventilation: Mask ventilation without difficulty Laryngoscope Size: Glidescope (Elective GS d/t surgery) Tube type: Oral Tube size: 7.5 mm Number of attempts: 1 Airway Equipment and Method: Stylet and Oral airway Placement Confirmation: ETT inserted through vocal cords under direct vision, positive ETCO2 and breath sounds checked- equal and bilateral Secured at: 23 cm Tube secured with: Tape Dental Injury: Teeth and Oropharynx as per pre-operative assessment

## 2022-12-13 NOTE — Transfer of Care (Signed)
Immediate Anesthesia Transfer of Care Note  Patient: Rodney Gray  Procedure(s) Performed: Anterior Cervical Decompression Fusion  Cervical five-Cervical six - Cervical six-Cervical seven  Patient Location: PACU  Anesthesia Type:General  Level of Consciousness: awake, alert , and oriented  Airway & Oxygen Therapy: Patient Spontanous Breathing and Patient connected to nasal cannula oxygen  Post-op Assessment: Report given to RN and Post -op Vital signs reviewed and stable  Post vital signs: Reviewed and stable  Last Vitals:  Vitals Value Taken Time  BP 145/79 12/13/22 1000  Temp    Pulse 76 12/13/22 1002  Resp 19 12/13/22 1002  SpO2 97 % 12/13/22 1002  Vitals shown include unfiled device data.  Last Pain:  Vitals:   12/13/22 0620  TempSrc:   PainSc: 0-No pain      Patients Stated Pain Goal: 0 (12/13/22 1610)  Complications: No notable events documented.

## 2022-12-13 NOTE — Anesthesia Postprocedure Evaluation (Signed)
Anesthesia Post Note  Patient: Rodney Gray  Procedure(s) Performed: Anterior Cervical Decompression Fusion  Cervical five-Cervical six - Cervical six-Cervical seven     Patient location during evaluation: PACU Anesthesia Type: General Level of consciousness: awake and alert Pain management: pain level controlled Vital Signs Assessment: post-procedure vital signs reviewed and stable Respiratory status: spontaneous breathing, nonlabored ventilation and respiratory function stable Cardiovascular status: blood pressure returned to baseline and stable Postop Assessment: no apparent nausea or vomiting Anesthetic complications: no  No notable events documented.  Last Vitals:  Vitals:   12/13/22 1030 12/13/22 1101  BP:  130/79  Pulse:  64  Resp:  18  Temp: 36.7 C 36.7 C  SpO2:  97%    Last Pain:  Vitals:   12/13/22 1030  TempSrc:   PainSc: 1                  Jalysa Swopes,W. EDMOND

## 2022-12-13 NOTE — Op Note (Signed)
Preoperative diagnosis: Cervical spondylosis with radiculopathy C5-6 C6-7.  Postoperative diagnosis: Same.  Procedure: Anterior cervical discectomies and fusion at C5-6 and C6-7 utilizing the globus titanium cages packed with locally harvested autograft mixed with Vivigen and anterior cervical plating utilizing the globus resonate plating system and 6-15 mm screws.  Surgeon: Donalee Citrin.  Assistant: Julien Girt.  Anesthesia: General.  EBL: Minimal.  HPI: 72 year old male with neck pain and right arm pain radiating C6-C7 nerve root pattern.  Workup revealed cervical spondylosis at those 2 levels and due to his failed conservative treatment imaging findings and progression of clinical syndrome I recommended anterior cervical plating and decompression I extensively over the risks and benefits of the operation with him as well as perioperative course expectations of outcome and alternatives to surgery and he understands and agrees to proceed forward.  Operative procedure: Patient was brought into the OR was induced under general anesthesia positioned supine the neck in slight extension 5 pounds all traction.  The right side of his neck was prepped and draped in routine sterile fashion.  Preoperative x-ray localized the appropriate level.  A curvilinear incision was made just off the midline to the entry border of the sternocleidomastoid and the superficial lobe test was dissected out divided longitudinally.  The avascular plane between the sternocleidomastoid and strap muscle was developed down to the prevertebral fascia and prevertebral fascia was dissected away with Kitners.  Interoperative x-ray confirmed identification appropriate level.  So annulotomy's were made with a 15 blade scalpel to mark this space longus was reflected laterally and self-retaining retractors were placed.  Both disc bases were drilled down capturing the bone shavings and mucus trap anterior osteophytes were bitten off with a  3 Miller Kerrison punch.  First working at C6-7 under microscopic illumination this to space was further drilled down under biting both endplates and allowed indication posterior longitudinal ligament which was removed in piecemeal fashion.  This decompressed central canal there is marked spondylosis at this level and both C7 pedicles identified both C7 nerve roots were decompressed and skeletonized flush with the pedicle there was marked foraminal stenosis primarily on the right.  This was then packed with Gelfoam attention taken at C5-6 and pathology here was again a lot of posterior endplate spurring uncinate hypertrophy this was all removed decompressing central canal and both C6 nerve roots.  After adequate discectomy decompression been achieved at both levels I sized up to cages 1 and 6 mm at C5-6 and 7 mm C6-7 both were packed with autograft mix and Vivigen and these were inserted 1 to 2 mm deep to anterior vertebral line.  I then selected a 32 mm globus resonate plate packs of additional bone material laterally to the cages and underneath the plate and the plate was anchored in place with locking mechanisms engaged.  The wound was copiously irrigated meticulous hemostasis was maintained the partitions were approximated with interrupted Vicryl skin was closed with running 4 subcuticular Dermabond benzoin Steri-Strips and a sterile dressing was applied patient recovery in stable condition.  At the end the case all needle count sponge counts were correct.

## 2022-12-13 NOTE — Plan of Care (Signed)

## 2022-12-13 NOTE — Discharge Summary (Signed)
Physician Discharge Summary  Patient ID: Rodney Gray MRN: [id number] DOB/AGE: [birth year] 72 y.o.  Admit date: 12/13/2022 Discharge date: 12/13/2022  Admission Diagnoses: Cervical spondylosis with radiculopathy C5-6 C6-7.     Discharge Diagnoses: same   Discharged Condition: good  Hospital Course: The patient was admitted on 12/13/2022 and taken to the operating room where the patient underwent ACDF c5-6, C6-7. The patient tolerated the procedure well and was taken to the recovery room and then to the floor in stable condition. The hospital course was routine. There were no complications. The wound remained clean dry and intact. Pt had appropriate neck soreness. No complaints of arm pain or new N/T/W. The patient remained afebrile with stable vital signs, and tolerated a regular diet. The patient continued to increase activities, and pain was well controlled with oral pain medications.   Consults: None  Significant Diagnostic Studies:  Results for orders placed or performed during the hospital encounter of 12/03/22  Surgical pcr screen   Specimen: Nasal Mucosa; Nasal Swab  Result Value Ref Range   MRSA, PCR NEGATIVE NEGATIVE   Staphylococcus aureus NEGATIVE NEGATIVE  Basic metabolic panel per protocol  Result Value Ref Range   Sodium 134 (L) 135 - 145 mmol/L   Potassium 4.1 3.5 - 5.1 mmol/L   Chloride 100 98 - 111 mmol/L   CO2 24 22 - 32 mmol/L   Glucose, Bld 94 70 - 99 mg/dL   BUN 15 8 - 23 mg/dL   Creatinine, Ser 9.62 0.61 - 1.24 mg/dL   Calcium 9.2 8.9 - 95.2 mg/dL   GFR, Estimated >84 >13 mL/min   Anion gap 10 5 - 15  CBC per protocol  Result Value Ref Range   WBC 7.4 4.0 - 10.5 K/uL   RBC 4.36 4.22 - 5.81 MIL/uL   Hemoglobin 14.1 13.0 - 17.0 g/dL   HCT 24.4 01.0 - 27.2 %   MCV 96.8 80.0 - 100.0 fL   MCH 32.3 26.0 - 34.0 pg   MCHC 33.4 30.0 - 36.0 g/dL   RDW 53.6 64.4 - 03.4 %   Platelets 236 150 - 400 K/uL   nRBC 0.0 0.0 - 0.2 %    DG Abd 1 View  Result  Date: 12/13/2022 CLINICAL DATA:  History of left-sided nephrolithiasis. No current complaints. EXAM: ABDOMEN - 1 VIEW COMPARISON:  CT abdomen and pelvis-09/01/2022 FINDINGS: Phleboliths overlie the lower pelvis bilaterally. No definitive abnormal opacities overlie the expected location of either renal fossa, ureter or the urinary bladder. Moderate stool burden without evidence of enteric obstruction. No pneumoperitoneum, pneumatosis or portal venous gas Limited visualization of the lower thorax is normal given obliquity and technique Mild-to-moderate multilevel thoracolumbar spine degenerative changes suspected though incompletely evaluated. IMPRESSION: 1. No definitive radiographic evidence of nephrolithiasis. 2. Moderate stool burden without evidence of enteric obstruction. Electronically Signed   By: Simonne Come M.D.   On: 12/13/2022 13:45   DG Cervical Spine 1 View  Result Date: 12/13/2022 CLINICAL DATA:  Post C5-C7 ACDF. EXAM: DG CERVICAL SPINE - 1 VIEW; DG C-ARM 1-60 MIN-NO REPORT COMPARISON:  Cervical spine MRI-09/12/2022 FLUOROSCOPY TIME: FLUOROSCOPY TIME 6 seconds (0.6 mGy) FINDINGS: A single spot lateral projection radiographic image of the cervical spine is provided for review and demonstrates the sequela C5-C7 ACDF and intervertebral disc space replacement. Alignment appears anatomic. Endotracheal tube overlies the tracheal air column with tip excluded from view. No radiopaque foreign body. IMPRESSION: Post C5-C7 ACDF without evidence of complication. Electronically Signed   By:  Simonne Come M.D.   On: 12/13/2022 13:43   DG C-Arm 1-60 Min-No Report  Result Date: 12/13/2022 Fluoroscopy was utilized by the requesting physician.  No radiographic interpretation.   DG C-Arm 1-60 Min-No Report  Result Date: 12/13/2022 Fluoroscopy was utilized by the requesting physician.  No radiographic interpretation.    Antibiotics:  Anti-infectives (From admission, onward)    Start     Dose/Rate Route Frequency  Ordered Stop   12/13/22 1400  ceFAZolin (ANCEF) IVPB 2g/100 mL premix        2 g 200 mL/hr over 30 Minutes Intravenous Every 8 hours 12/13/22 1046 12/14/22 0559   12/13/22 0600  vancomycin (VANCOCIN) IVPB 1000 mg/200 mL premix        1,000 mg 200 mL/hr over 60 Minutes Intravenous On call to O.R. 12/13/22 0557 12/13/22 0803       Discharge Exam: Blood pressure (!) 153/77, pulse 97, temperature 97.8 F (36.6 C), temperature source Oral, resp. rate 20, height 5' 7.5" (1.715 m), weight 82.6 kg, SpO2 98%. Neurologic: Grossly normal Ambulating and voiding well incision cdi   Discharge Medications:   Allergies as of 12/13/2022       Reactions   Ampicillin Hives   Has tolerated cephalosporins        Medication List     STOP taking these medications    Eliquis 5 MG Tabs tablet Generic drug: apixaban       TAKE these medications    acetaminophen 650 MG CR tablet Commonly known as: TYLENOL Take 650 mg by mouth at bedtime as needed for pain.   ARTIFICIAL TEAR SOLUTION OP Place 1 drop into both eyes 3 (three) times daily.   clindamycin 150 MG capsule Commonly known as: CLEOCIN Take 4 capsules by mouth as needed. Prior to dental appointments   diclofenac Sodium 1 % Gel Commonly known as: VOLTAREN Apply 1 Application topically 4 (four) times daily as needed (pain).   diltiazem 180 MG 24 hr capsule Commonly known as: CARDIZEM CD TAKE 1 CAPSULE BY MOUTH EVERY DAY   Fish Oil 1200 MG Caps Take 1 capsule by mouth daily.   MENS MULTIVITAMIN PLUS PO Take 1 capsule by mouth daily.   methocarbamol 500 MG tablet Commonly known as: ROBAXIN Take 1 tablet (500 mg total) by mouth at bedtime.   montelukast 10 MG tablet Commonly known as: SINGULAIR TAKE 1 TABLET (10 MG TOTAL) BY MOUTH AS NEEDED. ALLERGIES   omeprazole 20 MG capsule Commonly known as: PRILOSEC TAKE 1 CAPSULE BY MOUTH AT BEDTIME. What changed:  when to take this additional instructions   predniSONE 5 MG  tablet Commonly known as: DELTASONE Take 1-2 tablets (5-10 mg total) by mouth every other day. Wean as directed What changed: how much to take   predniSONE 1 MG tablet Commonly known as: DELTASONE Take 1 tablet (1 mg total) by mouth every other day. Along with a 5 mg tab What changed: Another medication with the same name was changed. Make sure you understand how and when to take each.   tadalafil 10 MG tablet Commonly known as: CIALIS Take 1 tablet (10 mg total) by mouth daily. OK to take additional 10mg  boost dose as needed 45 minutes prior to sexual activity        Disposition: home   Final Dx: acdf C5-6, C6-7  Discharge Instructions      Remove dressing in 72 hours   Complete by: As directed    Call MD for:  difficulty  breathing, headache or visual disturbances   Complete by: As directed    Call MD for:  hives   Complete by: As directed    Call MD for:  persistant nausea and vomiting   Complete by: As directed    Call MD for:  redness, tenderness, or signs of infection (pain, swelling, redness, odor or green/yellow discharge around incision site)   Complete by: As directed    Call MD for:  severe uncontrolled pain   Complete by: As directed    Call MD for:  temperature >100.4   Complete by: As directed    Diet - low sodium heart healthy   Complete by: As directed    Driving Restrictions   Complete by: As directed    No driving for 2 weeks, no riding in the car for 1 week   Increase activity slowly   Complete by: As directed    Lifting restrictions   Complete by: As directed    No lifting more than 8 lbs          Signed: Tiana Loft Kaleen Rochette 12/13/2022, 5:07 PM

## 2022-12-13 NOTE — Progress Notes (Signed)
Patient alert and oriented, void, ambulate. Surgical site clean and dry no sign of infection. S/c instructions explain and given to the patient all questions answered. Pt. D/c home per order.

## 2022-12-14 ENCOUNTER — Encounter (HOSPITAL_COMMUNITY): Payer: Self-pay | Admitting: Neurosurgery

## 2022-12-14 MED FILL — Thrombin For Soln 5000 Unit: CUTANEOUS | Qty: 2 | Status: AC

## 2022-12-15 ENCOUNTER — Ambulatory Visit: Payer: Medicare Other | Admitting: Gastroenterology

## 2022-12-15 ENCOUNTER — Telehealth: Payer: Self-pay

## 2022-12-15 NOTE — Transitions of Care (Post Inpatient/ED Visit) (Signed)
12/15/2022  Name: Rodney Gray MRN: [id number] DOB: 1950/07/27  Today's TOC FU Call Status: Today's TOC FU Call Status:: Successful TOC FU Call Completed Unsuccessful Call (1st Attempt) Date: 12/15/22 [facility] FU Call Complete Date: 12/15/22 Patient's Name and Date of Birth confirmed.  Transition Care Management Follow-up Telephone Call Date of Discharge: 12/13/22 Discharge Facility: [facility]) Type of Discharge: Inpatient Admission How have you been since you were released from the hospital?: Better (reports that he is feeling good.  Reports a hoarse voice.) Any questions or concerns?: No  Items Reviewed: Did you receive and understand the discharge instructions provided?: Yes Medications obtained,verified, and reconciled?: Yes (Medications Reviewed) Any new allergies since your discharge?: No Dietary orders reviewed?: Yes Type of Diet Ordered:: low salt.  soft foods Do you have support at home?: Yes People in Home: spouse Name of Support/Comfort Primary Source: wife   [place]  Medications Reviewed Today: Medications Reviewed Today     Reviewed by Earlie Server, RN (Registered Nurse) on 12/15/22 at 1217  Med List Status: <None>   Medication Order Taking? Sig Documenting Provider Last Dose Status Informant  acetaminophen (TYLENOL) 650 MG CR tablet [id number] Yes Take 650 mg by mouth at bedtime as needed for pain. [provider] Taking Active Self  ARTIFICIAL TEAR SOLUTION OP [id number] Yes Place 1 drop into both eyes 3 (three) times daily. [provider] Taking Active Self  clindamycin (CLEOCIN) 150 MG capsule [id number] No Take 4 capsules by mouth as needed. Prior to dental appointments  Patient not taking: Reported on 12/15/2022   [provider] Not Taking Active Self  diclofenac Sodium (VOLTAREN) 1 % GEL [id number] No Apply 1 Application topically 4 (four) times daily as needed (pain).  Patient not taking: Reported on 12/15/2022    [provider] Not Taking Active Self  diltiazem (CARDIZEM CD) 180 MG 24 hr capsule [id number] Yes TAKE 1 CAPSULE BY MOUTH EVERY DAY Marinus Maw, MD Taking Active Self  methocarbamol (ROBAXIN) 500 MG tablet [id number] Yes Take 1 tablet (500 mg total) by mouth at bedtime. Meyran, Tiana Loft, NP Taking Active   montelukast (SINGULAIR) 10 MG tablet [id number] Yes TAKE 1 TABLET (10 MG TOTAL) BY MOUTH AS NEEDED. ALLERGIES Mort Sawyers, FNP Taking Active Self  Multiple Vitamins-Minerals (MENS MULTIVITAMIN PLUS PO) [id number] No Take 1 capsule by mouth daily.  Patient not taking: Reported on 12/15/2022   [provider] Not Taking Active Self  Omega-3 Fatty Acids (FISH OIL) 1200 MG CAPS [id number] No Take 1 capsule by mouth daily.  Patient not taking: Reported on 12/15/2022   [provider] Not Taking Active Self  omeprazole (PRILOSEC) 20 MG capsule [id number] No TAKE 1 CAPSULE BY MOUTH AT BEDTIME.  Patient not taking: Reported on 12/15/2022   Karie Schwalbe, MD Not Taking Active Self           Med Note (ROSE, Lanell Matar   Wed Dec 15, 2022 12:16 PM) Take as needed  predniSONE (DELTASONE) 1 MG tablet [id number] Yes Take 1 tablet (1 mg total) by mouth every other day. Along with a 5 mg tab Karie Schwalbe, MD Taking Active Self  predniSONE (DELTASONE) 5 MG tablet [id number] Yes Take 1-2 tablets (5-10 mg total) by mouth every other day. Wean as directed  Patient taking differently: Take 5 mg by mouth every other day. Wean as directed   Karie Schwalbe, MD Taking Active Self  Med Note (ROSE, Shareeka Yim U   Wed Dec 15, 2022 12:17 PM) 6mg  every other day  tadalafil (CIALIS) 10 MG tablet [id number] No Take 1 tablet (10 mg total) by mouth daily. OK to take additional 10mg  boost dose as needed 45 minutes prior to sexual activity  Patient not taking: Reported on 12/15/2022   Sondra Come, MD Not Taking Active             Home Care and Equipment/Supplies: Were  Home Health Services Ordered?: No Any new equipment or medical supplies ordered?: No  Functional Questionnaire: Do you need assistance with bathing/showering or dressing?: No Do you need assistance with meal preparation?: No Do you need assistance with eating?: No Do you have difficulty maintaining continence: No Do you need assistance with getting out of bed/getting out of a chair/moving?: No Do you have difficulty managing or taking your medications?: No  Follow up appointments reviewed: PCP Follow-up appointment confirmed?: Yes Date of PCP follow-up appointment?: 12/23/22 Follow-up Provider: Dr. Alphonsus Sias Specialist [facility] Follow-up appointment confirmed?: Yes Date of Specialist follow-up appointment?: 12/30/22 Follow-Up Specialty Provider:: Dr. Glee Arvin Do you need transportation to your follow-up appointment?: No Do you understand care options if your condition(s) worsen?: Yes-patient verbalized understanding  SDOH Interventions Today    Flowsheet Row Most Recent Value  SDOH Interventions   Food Insecurity Interventions Intervention Not Indicated  Transportation Interventions Intervention Not Indicated      Interventions Today    Flowsheet Row Most Recent Value  Chronic Disease   Chronic disease during today's visit Other  [cervical surgery]  Exercise Interventions   Exercise Discussed/Reviewed Physical Activity  Nutrition Interventions   Nutrition Discussed/Reviewed Nutrition Discussed       Lonia Chimera, RN, BSN, CEN [facility] Care Coordinator [phone]

## 2022-12-23 ENCOUNTER — Encounter: Payer: Self-pay | Admitting: Internal Medicine

## 2022-12-23 ENCOUNTER — Ambulatory Visit (INDEPENDENT_AMBULATORY_CARE_PROVIDER_SITE_OTHER): Payer: Medicare Other | Admitting: Internal Medicine

## 2022-12-23 VITALS — BP 114/68 | HR 82 | Temp 97.6°F | Ht 67.5 in | Wt 180.0 lb

## 2022-12-23 DIAGNOSIS — M4802 Spinal stenosis, cervical region: Secondary | ICD-10-CM

## 2022-12-23 DIAGNOSIS — M353 Polymyalgia rheumatica: Secondary | ICD-10-CM | POA: Diagnosis not present

## 2022-12-23 DIAGNOSIS — M25511 Pain in right shoulder: Secondary | ICD-10-CM | POA: Diagnosis not present

## 2022-12-23 DIAGNOSIS — I480 Paroxysmal atrial fibrillation: Secondary | ICD-10-CM | POA: Diagnosis not present

## 2022-12-23 DIAGNOSIS — G8929 Other chronic pain: Secondary | ICD-10-CM

## 2022-12-23 NOTE — Assessment & Plan Note (Addendum)
Rate is fine on diltiazem 180 Back on eliquis

## 2022-12-23 NOTE — Progress Notes (Signed)
Subjective:    Patient ID: Rodney Gray, male    DOB: May 04, 1950, 72 y.o.   MRN: [id number]  HPI Here for hospital follow up With wife  Had ACDF 9/9 Did well enough to leave the same day Walking right away  Some throat pain--on right side Improved this week Some trouble swallowing--eating soft foods for now Has had regular cough---now finally some better in the next day. Has sense that he has to cough--so he does Uses numbing spray and lozenges  No pain into arms Numbness in arms is slightly better--but still feels "funny"  Using tylenol 650mg  at times--but not regular Methocarbamol at bedtime  Current Outpatient Medications on File Prior to Visit  Medication Sig Dispense Refill   acetaminophen (TYLENOL) 650 MG CR tablet Take 650 mg by mouth at bedtime as needed for pain.     diltiazem (CARDIZEM CD) 180 MG 24 hr capsule TAKE 1 CAPSULE BY MOUTH EVERY DAY 90 capsule 0   methocarbamol (ROBAXIN) 500 MG tablet Take 1 tablet (500 mg total) by mouth at bedtime. 30 tablet 1   predniSONE (DELTASONE) 1 MG tablet Take 1 tablet (1 mg total) by mouth every other day. Along with a 5 mg tab 45 tablet 3   predniSONE (DELTASONE) 5 MG tablet Take 1-2 tablets (5-10 mg total) by mouth every other day. Wean as directed (Patient taking differently: Take 5 mg by mouth every other day. Wean as directed) 60 tablet 11   ARTIFICIAL TEAR SOLUTION OP Place 1 drop into both eyes 3 (three) times daily. (Patient not taking: Reported on 12/23/2022)     clindamycin (CLEOCIN) 150 MG capsule Take 4 capsules by mouth as needed. Prior to dental appointments (Patient not taking: Reported on 12/23/2022)     diclofenac Sodium (VOLTAREN) 1 % GEL Apply 1 Application topically 4 (four) times daily as needed (pain). (Patient not taking: Reported on 12/23/2022)     montelukast (SINGULAIR) 10 MG tablet TAKE 1 TABLET (10 MG TOTAL) BY MOUTH AS NEEDED. ALLERGIES (Patient not taking: Reported on 12/23/2022) 90 tablet 1    Multiple Vitamins-Minerals (MENS MULTIVITAMIN PLUS PO) Take 1 capsule by mouth daily. (Patient not taking: Reported on 12/23/2022)     Omega-3 Fatty Acids (FISH OIL) 1200 MG CAPS Take 1 capsule by mouth daily. (Patient not taking: Reported on 12/23/2022)     omeprazole (PRILOSEC) 20 MG capsule TAKE 1 CAPSULE BY MOUTH AT BEDTIME. (Patient not taking: Reported on 12/23/2022) 90 capsule 3   tadalafil (CIALIS) 10 MG tablet Take 1 tablet (10 mg total) by mouth daily. OK to take additional 10mg  boost dose as needed 45 minutes prior to sexual activity (Patient not taking: Reported on 12/23/2022) 90 tablet 4   No current facility-administered medications on file prior to visit.    Allergies  Allergen Reactions   Ampicillin Hives    Has tolerated cephalosporins    Past Medical History:  Diagnosis Date   Allergic rhinitis due to pollen    Atrial fibrillation (HCC)    A-Fib, 1987, 2003, 2005, EPS RX 2005 (A-Fib reocurred)   Atrial fibrillation (HCC)    Basal cell carcinoma    skin CA- squamous and Basal cell   BPH (benign prostatic hypertrophy)    Cataract    Fibromyalgia    History of kidney stones    kidney stones 2 years ago   Hyperlipemia    no per pt   Hypertension    no per pt   Kidney stone  Osteoarthritis    Polymyalgia rheumatica (HCC)    Squamous cell carcinoma in situ     Past Surgical History:  Procedure Laterality Date   ANTERIOR CERVICAL DECOMP/DISCECTOMY FUSION N/A 12/13/2022   Procedure: Anterior Cervical Decompression Fusion  Cervical five-Cervical six - Cervical six-Cervical seven;  Surgeon: Donalee Citrin, MD;  Location: [facility] OR;  Service: Neurosurgery;  Laterality: N/A;   CAPSULOTOMY Bilateral 08/2007   CATARACT EXTRACTION Bilateral    both eyes   COLONOSCOPY     MENISCUS REPAIR Left 09/2009   left knee---Dr [facility]   NASAL SINUS SURGERY  May 2014   RETINAL TEAR REPAIR CRYOTHERAPY Right    right 12/06   RETINAL TEAR REPAIR CRYOTHERAPY Right    Retinal tear/bleed-  Right 12/06   SQUAMOUS CELL CARCINOMA EXCISION Left 07/2008   left forearm   THUMB ARTHROSCOPY Left 1999    Family History  Problem Relation Age of Onset   Hypertension Father    Cirrhosis Father 61   Alcohol abuse Father    Gallstones Mother    Hyperlipidemia Mother    Esophageal cancer Brother    Asthma Daughter    Coronary artery disease Neg Hx    Diabetes Neg Hx    Cancer Neg Hx        prostate or colon   Colon cancer Neg Hx    Rectal cancer Neg Hx    Stomach cancer Neg Hx     Social History   Socioeconomic History   Marital status: Married    Spouse name: Not on file   Number of children: 3   Years of education: Not on file   Highest education level: Not on file  Occupational History   Occupation: retired Mudlogger: RETIRED  Tobacco Use   Smoking status: Never    Passive exposure: Never   Smokeless tobacco: Never  Vaping Use   Vaping status: Never Used  Substance and Sexual Activity   Alcohol use: Yes    Comment: occ. 3 per month   Drug use: Never   Sexual activity: Yes  Other Topics Concern   Not on file  Social History Narrative   Has living will   Wife is health care POA--alternate is son Arlys John   Would accept resuscitation attempts   Would accept tube feeds for some time--depending on prognosis   Social Determinants of Health   Financial Resource Strain: Not on file  Food Insecurity: No Food Insecurity (12/15/2022)   Hunger Vital Sign    Worried About Running Out of Food in the Last Year: Never true    Ran Out of Food in the Last Year: Never true  Transportation Needs: No Transportation Needs (12/15/2022)   PRAPARE - Administrator, Civil Service (Medical): No    Lack of Transportation (Non-Medical): No  Physical Activity: Not on file  Stress: Not on file  Social Connections: Not on file  Intimate Partner Violence: Not on file   Review of Systems Sleeping with HOB up 30 degrees No SOB---using  incentive spirometer Still has trouble with movement in right shoulder    Objective:   Physical Exam Constitutional:      Appearance: Normal appearance.  HENT:     Mouth/Throat:     Pharynx: No oropharyngeal exudate or posterior oropharyngeal erythema.     Comments: Palate moves normal Neck:     Comments: Right cervical incision is clean and dry Cardiovascular:     Rate  and Rhythm: Normal rate. Rhythm irregular.     Heart sounds: No murmur heard.    No gallop.  Pulmonary:     Effort: Pulmonary effort is normal.     Breath sounds: Normal breath sounds. No wheezing or rales.  Musculoskeletal:     Cervical back: Neck supple.  Lymphadenopathy:     Cervical: No cervical adenopathy.  Neurological:     Mental Status: He is alert.     Comments: Normal strength in arms            Assessment & Plan:

## 2022-12-23 NOTE — Assessment & Plan Note (Signed)
Still on the prednisone 6mg  daily

## 2022-12-23 NOTE — Assessment & Plan Note (Signed)
Will likely get cortisone injection from Dr Magnus Ivan now that the neck surgery is done

## 2022-12-23 NOTE — Assessment & Plan Note (Signed)
Doing well post op No pain issues Cough and throat symptoms are improving slowly

## 2023-01-03 ENCOUNTER — Ambulatory Visit: Payer: Medicare Other | Admitting: Orthopaedic Surgery

## 2023-01-03 ENCOUNTER — Encounter: Payer: Self-pay | Admitting: Orthopaedic Surgery

## 2023-01-03 DIAGNOSIS — G8929 Other chronic pain: Secondary | ICD-10-CM | POA: Diagnosis not present

## 2023-01-03 DIAGNOSIS — M25511 Pain in right shoulder: Secondary | ICD-10-CM

## 2023-01-03 NOTE — Progress Notes (Signed)
The patient is following with me for his right shoulder.  He had an MRI earlier this year showing moderate tendinosis of the rotator cuff and significant arthropathy AC joint.  There was only small insertional tear of the rotator cuff that was very small.  However he has had a lot of difficulties with the anatomy of his shoulder in terms of how different it looks for him in terms of scapular winging and just how the shoulder feels and looks to him.  He recently had an anterior cervical discectomy and fusion at C5-C6 and C6-C7 by Dr. Wynetta Emery.  That was done on September 9.  He says his shoulder is still the exact same and some of the radicular symptoms have not improved but this was a problem that he been having for a very long period of time and it does take a while to get recovery from that extensive type of surgery especially when there has been chronic changes to the spinal cord and nerves.  On exam he still does have pain to the [facility] joint and subacromial outlet of the right shoulder but there is an anatomic difference of the scapular blade and scapular general on the right side and the left side when compared these.  Some of this anatomic difference may be related to chronic nerve issues from the cervical spine.  I would like to have Dr. August Saucer take a look at his right shoulder to see if there is any recommendations that he has from potentially a surgical perspective for the right shoulder.  I have encouraged the patient also talk to Dr. Wynetta Emery to get an idea of when he would be potentially cleared for surgery for his right shoulder if this is ends up being a recommendation.  The patient does agree with seeing my partner.

## 2023-01-04 DIAGNOSIS — M9902 Segmental and somatic dysfunction of thoracic region: Secondary | ICD-10-CM | POA: Diagnosis not present

## 2023-01-04 DIAGNOSIS — M5136 Other intervertebral disc degeneration, lumbar region with discogenic back pain only: Secondary | ICD-10-CM | POA: Diagnosis not present

## 2023-01-04 DIAGNOSIS — M9903 Segmental and somatic dysfunction of lumbar region: Secondary | ICD-10-CM | POA: Diagnosis not present

## 2023-01-04 DIAGNOSIS — M62830 Muscle spasm of back: Secondary | ICD-10-CM | POA: Diagnosis not present

## 2023-01-06 DIAGNOSIS — Z23 Encounter for immunization: Secondary | ICD-10-CM | POA: Diagnosis not present

## 2023-01-08 ENCOUNTER — Other Ambulatory Visit: Payer: Self-pay | Admitting: Internal Medicine

## 2023-01-12 ENCOUNTER — Ambulatory Visit (INDEPENDENT_AMBULATORY_CARE_PROVIDER_SITE_OTHER): Payer: Medicare Other | Admitting: Orthopedic Surgery

## 2023-01-12 ENCOUNTER — Other Ambulatory Visit: Payer: Self-pay

## 2023-01-12 DIAGNOSIS — G8929 Other chronic pain: Secondary | ICD-10-CM

## 2023-01-12 DIAGNOSIS — M25811 Other specified joint disorders, right shoulder: Secondary | ICD-10-CM | POA: Diagnosis not present

## 2023-01-12 DIAGNOSIS — M25511 Pain in right shoulder: Secondary | ICD-10-CM

## 2023-01-12 DIAGNOSIS — M7501 Adhesive capsulitis of right shoulder: Secondary | ICD-10-CM | POA: Diagnosis not present

## 2023-01-13 ENCOUNTER — Encounter: Payer: Self-pay | Admitting: Orthopedic Surgery

## 2023-01-13 NOTE — Progress Notes (Signed)
Office Visit Note   Patient: Rodney Gray           Date of Birth: 1950-12-04           MRN: [id number] Visit Date: 01/12/2023 Requested by: Karie Schwalbe, MD [address] [place],  [place] [address] PCP: Karie Schwalbe, MD  Subjective: Chief Complaint  Patient presents with   Right Shoulder - Pain    HPI: Rodney Gray is a 72 y.o. male who presents to the office reporting right shoulder pain ongoing since May.  In April he spent a lot of time doing yard work.  Did a lot of raking and spreading of mulch.  Complains of feeling a lump in his trapezial region as well.  He is right-hand dominant.  Pain does wake him from sleep at night.  Has difficulty with ADLs.  Did have cervical spine surgery 12/13/2022.  Patient did have an MRI scan of the shoulder which shows mild tendinosis of the rotator cuff tendons and mild tendinosis of the biceps tendon.  Severe AC joint arthropathy also present.  Glenohumeral joint appeared relatively spared from significant arthritis..                ROS: All systems reviewed are negative as they relate to the chief complaint within the history of present illness.  Patient denies fevers or chills.  Assessment & Plan: Visit Diagnoses:  1. Adhesive capsulitis of right shoulder   2. Chronic right shoulder pain   3. Mass of joint of right shoulder     Plan: Impression is right frozen shoulder with possible osteochondroma in the trapezial region from the scapula.  Plan is ultrasound-guided injection into the glenohumeral joint today as he does have loss of passive range of motion in that right shoulder.  Follow-up after that MRI scan so we can reassess the effect of the injection as well as to assess this osteochondroma type protrusion in the upper scapular region  Follow-Up Instructions: No follow-ups on file.   Orders:  Orders Placed This Encounter  Procedures   US Guided Needle Placement - No Linked Charges   No orders of the  defined types were placed in this encounter.     Procedures: Large Joint Inj: R glenohumeral on 01/12/2023 6:24 AM Indications: diagnostic evaluation and pain Details: 22 G 1.5 in needle, ultrasound-guided posterior approach  Arthrogram: No  Medications: 9 mL bupivacaine 0.5 %; 40 mg methylPREDNISolone acetate 40 MG/ML; 5 mL lidocaine 1 % Outcome: tolerated well, no immediate complications Procedure, treatment alternatives, risks and benefits explained, specific risks discussed. Consent was given by the patient. Immediately prior to procedure a time out was called to verify the correct patient, procedure, equipment, support staff and site/side marked as required. Patient was prepped and draped in the usual sterile fashion.       Clinical Data: No additional findings.  Objective: Vital Signs: There were no vitals taken for this visit.  Physical Exam:  Constitutional: Patient appears well-developed HEENT:  Head: Normocephalic Eyes:EOM are normal Neck: Normal range of motion Cardiovascular: Normal rate Pulmonary/chest: Effort normal Neurologic: Patient is alert Skin: Skin is warm Psychiatric: Patient has normal mood and affect  Ortho Exam: Ortho exam demonstrates range of motion on the right of 35/85/130.  Range of motion on the left is 70/120/180.  On that right-hand side he has active forward flexion and active abduction of 90 degrees or less.  He does have palpable bony tightness in the  trapezial region which is mildly tender to palpation.  Cervical spine range of motion intact and compatible with his recent surgery.  5 out of 5 grip EPL FPL interosseous wrist flexion extension bicep triceps and deltoid strength no paresthesias C5-T1.  Specialty Comments:  No specialty comments available.  Imaging: No results found.   PMFS History: Patient Active Problem List   Diagnosis Date Noted   Right shoulder pain 12/23/2022   Spinal stenosis in cervical region 12/13/2022    Gastroesophageal reflux disease 10/27/2022   Neck pain 08/05/2022   Preventative health care 10/21/2021   Hypertension 09/21/2021   Hemorrhoid prolapse 12/25/2019   Polymyalgia rheumatica (HCC) 08/01/2017   Advance directive discussed with patient 09/12/2015   BPH with obstruction/lower urinary tract symptoms    Allergic rhinitis due to pollen    Atrial fibrillation (HCC) 05/12/2009   Osteoarthritis 09/13/2006   Past Medical History:  Diagnosis Date   Allergic rhinitis due to pollen    Atrial fibrillation (HCC)    A-Fib, 1987, 2003, 2005, EPS RX 2005 (A-Fib reocurred)   Atrial fibrillation (HCC)    Basal cell carcinoma    skin CA- squamous and Basal cell   BPH (benign prostatic hypertrophy)    Cataract    Fibromyalgia    History of kidney stones    kidney stones 2 years ago   Hyperlipemia    no per pt   Hypertension    no per pt   Kidney stone    Osteoarthritis    Polymyalgia rheumatica (HCC)    Squamous cell carcinoma in situ     Family History  Problem Relation Age of Onset   Hypertension Father    Cirrhosis Father 47   Alcohol abuse Father    Gallstones Mother    Hyperlipidemia Mother    Esophageal cancer Brother    Asthma Daughter    Coronary artery disease Neg Hx    Diabetes Neg Hx    Cancer Neg Hx        prostate or colon   Colon cancer Neg Hx    Rectal cancer Neg Hx    Stomach cancer Neg Hx     Past Surgical History:  Procedure Laterality Date   ANTERIOR CERVICAL DECOMP/DISCECTOMY FUSION N/A 12/13/2022   Procedure: Anterior Cervical Decompression Fusion  Cervical five-Cervical six - Cervical six-Cervical seven;  Surgeon: Donalee Citrin, MD;  Location: [facility] OR;  Service: Neurosurgery;  Laterality: N/A;   CAPSULOTOMY Bilateral 08/2007   CATARACT EXTRACTION Bilateral    both eyes   COLONOSCOPY     MENISCUS REPAIR Left 09/2009   left knee---Dr [facility]   NASAL SINUS SURGERY  May 2014   RETINAL TEAR REPAIR CRYOTHERAPY Right    right 12/06   RETINAL TEAR  REPAIR CRYOTHERAPY Right    Retinal tear/bleed- Right 12/06   SQUAMOUS CELL CARCINOMA EXCISION Left 07/2008   left forearm   THUMB ARTHROSCOPY Left 1999   Social History   Occupational History   Occupation: retired Mudlogger: RETIRED  Tobacco Use   Smoking status: Never    Passive exposure: Never   Smokeless tobacco: Never  Vaping Use   Vaping status: Never Used  Substance and Sexual Activity   Alcohol use: Yes    Comment: occ. 3 per month   Drug use: Never   Sexual activity: Yes

## 2023-01-15 MED ORDER — LIDOCAINE HCL 1 % IJ SOLN
5.0000 mL | INTRAMUSCULAR | Status: AC | PRN
Start: 2023-01-12 — End: 2023-01-12
  Administered 2023-01-12: 5 mL

## 2023-01-15 MED ORDER — BUPIVACAINE HCL 0.5 % IJ SOLN
9.0000 mL | INTRAMUSCULAR | Status: AC | PRN
Start: 2023-01-12 — End: 2023-01-12
  Administered 2023-01-12: 9 mL via INTRA_ARTICULAR

## 2023-01-15 MED ORDER — METHYLPREDNISOLONE ACETATE 40 MG/ML IJ SUSP
40.0000 mg | INTRAMUSCULAR | Status: AC | PRN
Start: 2023-01-12 — End: 2023-01-12
  Administered 2023-01-12: 40 mg via INTRA_ARTICULAR

## 2023-01-18 DIAGNOSIS — M542 Cervicalgia: Secondary | ICD-10-CM | POA: Diagnosis not present

## 2023-01-18 DIAGNOSIS — Z6827 Body mass index (BMI) 27.0-27.9, adult: Secondary | ICD-10-CM | POA: Diagnosis not present

## 2023-01-18 DIAGNOSIS — M5412 Radiculopathy, cervical region: Secondary | ICD-10-CM | POA: Diagnosis not present

## 2023-01-27 ENCOUNTER — Ambulatory Visit: Payer: Medicare Other | Admitting: Gastroenterology

## 2023-02-02 DIAGNOSIS — M62830 Muscle spasm of back: Secondary | ICD-10-CM | POA: Diagnosis not present

## 2023-02-02 DIAGNOSIS — M9903 Segmental and somatic dysfunction of lumbar region: Secondary | ICD-10-CM | POA: Diagnosis not present

## 2023-02-02 DIAGNOSIS — M9902 Segmental and somatic dysfunction of thoracic region: Secondary | ICD-10-CM | POA: Diagnosis not present

## 2023-02-02 DIAGNOSIS — M5136 Other intervertebral disc degeneration, lumbar region with discogenic back pain only: Secondary | ICD-10-CM | POA: Diagnosis not present

## 2023-02-03 ENCOUNTER — Ambulatory Visit
Admission: RE | Admit: 2023-02-03 | Discharge: 2023-02-03 | Disposition: A | Discharge status: Home / Self Care | Payer: Medicare Other | Source: Ambulatory Visit | Attending: Orthopedic Surgery | Admitting: Orthopedic Surgery

## 2023-02-03 DIAGNOSIS — M19011 Primary osteoarthritis, right shoulder: Secondary | ICD-10-CM | POA: Diagnosis not present

## 2023-02-03 DIAGNOSIS — G8929 Other chronic pain: Secondary | ICD-10-CM

## 2023-02-04 ENCOUNTER — Ambulatory Visit
Admission: EM | Admit: 2023-02-04 | Discharge: 2023-02-04 | Disposition: A | Discharge status: Home / Self Care | Payer: Medicare Other

## 2023-02-04 DIAGNOSIS — R0981 Nasal congestion: Secondary | ICD-10-CM | POA: Diagnosis not present

## 2023-02-04 DIAGNOSIS — J309 Allergic rhinitis, unspecified: Secondary | ICD-10-CM | POA: Diagnosis not present

## 2023-02-04 NOTE — Discharge Instructions (Addendum)
Take plain Mucinex as directed.  Follow-up with your primary care provider if your symptoms are not improving.

## 2023-02-04 NOTE — ED Triage Notes (Signed)
Patient to Urgent Care with complaints of sinus pain and pressure/ frontal headache. Concerns about a sinus infection.   Symptoms started Wednesday. Taking Singulair.

## 2023-02-04 NOTE — ED Provider Notes (Signed)
Renaldo Fiddler    CSN: [id number] Arrival date & time: 02/04/23  1630      History   Chief Complaint Chief Complaint  Patient presents with   Nasal Congestion    HPI Rodney Gray is a 72 y.o. male.  Patient presents with 1.5-day history of sinus congestion, sinus pressure, hoarse voice, headache.  No fever, cough, shortness of breath, or other symptoms.  He is concerned for possible sinus infection.  He has been taking Singulair that was prescribed in 2022 by an ENT.  His medical history includes hypertension, atrial fibrillation, myalgia rheumatica, allergic rhinitis due to pollen.  The history is provided by the patient and medical records.    Past Medical History:  Diagnosis Date   Allergic rhinitis due to pollen    Atrial fibrillation (HCC)    A-Fib, 1987, 2003, 2005, EPS RX 2005 (A-Fib reocurred)   Atrial fibrillation (HCC)    Basal cell carcinoma    skin CA- squamous and Basal cell   BPH (benign prostatic hypertrophy)    Cataract    Fibromyalgia    History of kidney stones    kidney stones 2 years ago   Hyperlipemia    no per pt   Hypertension    no per pt   Kidney stone    Osteoarthritis    Polymyalgia rheumatica (HCC)    Squamous cell carcinoma in situ     Patient Active Problem List   Diagnosis Date Noted   Right shoulder pain 12/23/2022   Spinal stenosis in cervical region 12/13/2022   Gastroesophageal reflux disease 10/27/2022   Neck pain 08/05/2022   Preventative health care 10/21/2021   Hypertension 09/21/2021   Hemorrhoid prolapse 12/25/2019   Polymyalgia rheumatica (HCC) 08/01/2017   Advance directive discussed with patient 09/12/2015   BPH with obstruction/lower urinary tract symptoms    Allergic rhinitis due to pollen    Atrial fibrillation (HCC) 05/12/2009   Osteoarthritis 09/13/2006    Past Surgical History:  Procedure Laterality Date   ANTERIOR CERVICAL DECOMP/DISCECTOMY FUSION N/A 12/13/2022   Procedure: Anterior  Cervical Decompression Fusion  Cervical five-Cervical six - Cervical six-Cervical seven;  Surgeon: Donalee Citrin, MD;  Location: [facility] OR;  Service: Neurosurgery;  Laterality: N/A;   CAPSULOTOMY Bilateral 08/2007   CATARACT EXTRACTION Bilateral    both eyes   COLONOSCOPY     MENISCUS REPAIR Left 09/2009   left knee---Dr [facility]   NASAL SINUS SURGERY  May 2014   RETINAL TEAR REPAIR CRYOTHERAPY Right    right 12/06   RETINAL TEAR REPAIR CRYOTHERAPY Right    Retinal tear/bleed- Right 12/06   SQUAMOUS CELL CARCINOMA EXCISION Left 07/2008   left forearm   THUMB ARTHROSCOPY Left 1999       Home Medications    Prior to Admission medications   Medication Sig Start Date End Date Taking? Authorizing Provider  acetaminophen (TYLENOL) 650 MG CR tablet Take 650 mg by mouth at bedtime as needed for pain.    [provider]  apixaban (ELIQUIS) 5 MG TABS tablet Take 5 mg by mouth 2 (two) times daily.    [provider]  ARTIFICIAL TEAR SOLUTION OP Place 1 drop into both eyes 3 (three) times daily. Patient not taking: Reported on 12/23/2022    [provider]  clindamycin (CLEOCIN) 150 MG capsule Take 4 capsules by mouth as needed. Prior to dental appointments Patient not taking: Reported on 12/23/2022 04/10/19   [provider]  diltiazem (CARDIZEM CD)  180 MG 24 hr capsule TAKE 1 CAPSULE BY MOUTH EVERY DAY 01/10/23   Marinus Maw, MD  methocarbamol (ROBAXIN) 500 MG tablet Take 1 tablet (500 mg total) by mouth at bedtime. 12/13/22   Meyran, Tiana Loft, NP  montelukast (SINGULAIR) 10 MG tablet TAKE 1 TABLET (10 MG TOTAL) BY MOUTH AS NEEDED. ALLERGIES Patient not taking: Reported on 12/23/2022 04/09/21   Mort Sawyers, FNP  Multiple Vitamins-Minerals (MENS MULTIVITAMIN PLUS PO) Take 1 capsule by mouth daily.    [provider]  Omega-3 Fatty Acids (FISH OIL) 1200 MG CAPS Take 1 capsule by mouth daily.    [provider]  omeprazole (PRILOSEC) 20 MG  capsule TAKE 1 CAPSULE BY MOUTH AT BEDTIME. Patient not taking: Reported on 12/23/2022 09/16/22   Karie Schwalbe, MD  predniSONE (DELTASONE) 1 MG tablet Take 1 tablet (1 mg total) by mouth every other day. Along with a 5 mg tab 11/18/22   Karie Schwalbe, MD  predniSONE (DELTASONE) 5 MG tablet Take 1-2 tablets (5-10 mg total) by mouth every other day. Wean as directed Patient taking differently: Take 5 mg by mouth every other day. Wean as directed 05/06/22   Karie Schwalbe, MD  tadalafil (CIALIS) 10 MG tablet Take 1 tablet (10 mg total) by mouth daily. OK to take additional 10mg  boost dose as needed 45 minutes prior to sexual activity 12/09/22   Sondra Come, MD    Family History Family History  Problem Relation Age of Onset   Hypertension Father    Cirrhosis Father 9   Alcohol abuse Father    Gallstones Mother    Hyperlipidemia Mother    Esophageal cancer Brother    Asthma Daughter    Coronary artery disease Neg Hx    Diabetes Neg Hx    Cancer Neg Hx        prostate or colon   Colon cancer Neg Hx    Rectal cancer Neg Hx    Stomach cancer Neg Hx     Social History Social History   Tobacco Use   Smoking status: Never    Passive exposure: Never   Smokeless tobacco: Never  Vaping Use   Vaping status: Never Used  Substance Use Topics   Alcohol use: Yes    Comment: occ. 3 per month   Drug use: Never     Allergies   Ampicillin   Review of Systems Review of Systems  Constitutional:  Negative for chills and fever.  HENT:  Positive for congestion, sinus pressure and voice change. Negative for ear pain and sore throat.   Respiratory:  Negative for cough and shortness of breath.   Neurological:  Positive for headaches.     Physical Exam Triage Vital Signs ED Triage Vitals  Encounter Vitals Group     BP      Systolic BP Percentile      Diastolic BP Percentile      Pulse      Resp      Temp      Temp src      SpO2      Weight      Height      Head  Circumference      Peak Flow      Pain Score      Pain Loc      Pain Education      Exclude from Growth Chart    No data found.  Updated Vital Signs BP Marland Kitchen)  147/87   Pulse 79   Temp 98.4 F (36.9 C)   Resp 18   SpO2 96%   Visual Acuity Right Eye Distance:   Left Eye Distance:   Bilateral Distance:    Right Eye Near:   Left Eye Near:    Bilateral Near:     Physical Exam Vitals and nursing note reviewed.  Constitutional:      General: He is not in acute distress.    Appearance: He is well-developed.  HENT:     Right Ear: Tympanic membrane normal.     Left Ear: Tympanic membrane normal.     Nose: Nose normal.     Mouth/Throat:     Mouth: Mucous membranes are moist.     Pharynx: Oropharynx is clear.     Comments: Clear PND. Cardiovascular:     Rate and Rhythm: Normal rate and regular rhythm.     Heart sounds: Normal heart sounds.  Pulmonary:     Effort: Pulmonary effort is normal. No respiratory distress.     Breath sounds: Normal breath sounds.  Musculoskeletal:     Cervical back: Neck supple.  Skin:    General: Skin is warm and dry.  Neurological:     Mental Status: He is alert.      UC Treatments / Results  Labs (all labs ordered are listed, but only abnormal results are displayed) Labs Reviewed - No data to display  EKG   Radiology No results found.  Procedures Procedures (including critical care time)  Medications Ordered in UC Medications - No data to display  Initial Impression / Assessment and Plan / UC Course  I have reviewed the triage vital signs and the nursing notes.  Pertinent labs & imaging results that were available during my care of the patient were reviewed by me and considered in my medical decision making (see chart for details).   Sinus congestion, allergic rhinitis.  Afebrile and vital signs are stable.  Patient has been symptomatic for 1.5 days.  Discussed symptomatic care including plain Mucinex and Tylenol.  Instructed  patient to follow-up with his PCP if his symptoms are not improving.  Education provided on allergic rhinitis.  Patient agrees to plan of care.    Final Clinical Impressions(s) / UC Diagnoses   Final diagnoses:  Sinus congestion  Allergic rhinitis, unspecified seasonality, unspecified trigger     Discharge Instructions      Take plain Mucinex as directed.  Follow-up with your primary care provider if your symptoms are not improving.      ED Prescriptions   None    PDMP not reviewed this encounter.   Mickie Bail, NP 02/04/23 1816

## 2023-02-09 ENCOUNTER — Ambulatory Visit: Payer: Medicare Other | Admitting: Orthopedic Surgery

## 2023-02-09 ENCOUNTER — Encounter: Payer: Self-pay | Admitting: Orthopedic Surgery

## 2023-02-09 DIAGNOSIS — G8929 Other chronic pain: Secondary | ICD-10-CM | POA: Diagnosis not present

## 2023-02-09 DIAGNOSIS — M25511 Pain in right shoulder: Secondary | ICD-10-CM

## 2023-02-09 NOTE — Progress Notes (Signed)
Office Visit Note   Patient: Rodney Gray           Date of Birth: 1950-07-31           MRN: [id number] Visit Date: 02/09/2023 Requested by: Karie Schwalbe, MD [address] [place],  [place] [address] PCP: Karie Schwalbe, MD  Subjective: Chief Complaint  Patient presents with   Other     Review MRI    HPI: ROLF STANGLAND is a 72 y.o. male who presents to the office reporting continued right shoulder pain.  Since he was last seen he has had an MRI scan of this prominence around the scapular spine that he was concerned about.  That scan is reviewed and it does not show really any masses or osteochondromas.  There is a prominence but nothing that will require any intervention.  Does have some AC joint arthritis.  Patient does have severe foraminal narrowing on the right-hand side at C5-6 from MRI scan done in July.  Patient states his shoulder is "a little stiff".              ROS: All systems reviewed are negative as they relate to the chief complaint within the history of present illness.  Patient denies fevers or chills.  Assessment & Plan: Visit Diagnoses:  1. Chronic right shoulder pain     Plan: Impression is no focal abnormality of the scapular spine.  This is the region he was concerned about.  She is got symmetric external rotation but a little weakness in forward flexion.  That may be related to his neck.  He has not had any physical therapy yet.  Patient wants to continue with where he is at right now with the shoulder.  If he wants to get further workup on the neck in terms of injections he will let us know.  Follow-Up Instructions: No follow-ups on file.   Orders:  No orders of the defined types were placed in this encounter.  No orders of the defined types were placed in this encounter.     Procedures: No procedures performed   Clinical Data: No additional findings.  Objective: Vital Signs: There were no vitals taken for this  visit.  Physical Exam:  Constitutional: Patient appears well-developed HEENT:  Head: Normocephalic Eyes:EOM are normal Neck: Normal range of motion Cardiovascular: Normal rate Pulmonary/chest: Effort normal Neurologic: Patient is alert Skin: Skin is warm Psychiatric: Patient has normal mood and affect  Ortho Exam: Ortho exam demonstrates active forward flexion and on the right of about 1 10-1 20 on the left is 180.  Rotator cuff strength is pretty reasonable infraspinatus supraspinatus and subscap muscle testing.  Symmetric external rotation to about 50 degrees bilaterally.  No discrete tenderness on the right AC joint versus left.  Minimal crepitus with internal/external rotation of that right-hand side with the shoulder AB ducted to 90 degrees.  Specialty Comments:  No specialty comments available.  Imaging: No results found.   PMFS History: Patient Active Problem List   Diagnosis Date Noted   Right shoulder pain 12/23/2022   Spinal stenosis in cervical region 12/13/2022   Gastroesophageal reflux disease 10/27/2022   Neck pain 08/05/2022   Preventative health care 10/21/2021   Hypertension 09/21/2021   Hemorrhoid prolapse 12/25/2019   Polymyalgia rheumatica (HCC) 08/01/2017   Advance directive discussed with patient 09/12/2015   BPH with obstruction/lower urinary tract symptoms    Allergic rhinitis due to pollen    Atrial  fibrillation (HCC) 05/12/2009   Osteoarthritis 09/13/2006   Past Medical History:  Diagnosis Date   Allergic rhinitis due to pollen    Atrial fibrillation (HCC)    A-Fib, 1987, 2003, 2005, EPS RX 2005 (A-Fib reocurred)   Atrial fibrillation (HCC)    Basal cell carcinoma    skin CA- squamous and Basal cell   BPH (benign prostatic hypertrophy)    Cataract    Fibromyalgia    History of kidney stones    kidney stones 2 years ago   Hyperlipemia    no per pt   Hypertension    no per pt   Kidney stone    Osteoarthritis    Polymyalgia rheumatica  (HCC)    Squamous cell carcinoma in situ     Family History  Problem Relation Age of Onset   Hypertension Father    Cirrhosis Father 57   Alcohol abuse Father    Gallstones Mother    Hyperlipidemia Mother    Esophageal cancer Brother    Asthma Daughter    Coronary artery disease Neg Hx    Diabetes Neg Hx    Cancer Neg Hx        prostate or colon   Colon cancer Neg Hx    Rectal cancer Neg Hx    Stomach cancer Neg Hx     Past Surgical History:  Procedure Laterality Date   ANTERIOR CERVICAL DECOMP/DISCECTOMY FUSION N/A 12/13/2022   Procedure: Anterior Cervical Decompression Fusion  Cervical five-Cervical six - Cervical six-Cervical seven;  Surgeon: Donalee Citrin, MD;  Location: [facility] OR;  Service: Neurosurgery;  Laterality: N/A;   CAPSULOTOMY Bilateral 08/2007   CATARACT EXTRACTION Bilateral    both eyes   COLONOSCOPY     MENISCUS REPAIR Left 09/2009   left knee---Dr [facility]   NASAL SINUS SURGERY  May 2014   RETINAL TEAR REPAIR CRYOTHERAPY Right    right 12/06   RETINAL TEAR REPAIR CRYOTHERAPY Right    Retinal tear/bleed- Right 12/06   SQUAMOUS CELL CARCINOMA EXCISION Left 07/2008   left forearm   THUMB ARTHROSCOPY Left 1999   Social History   Occupational History   Occupation: retired Mudlogger: RETIRED  Tobacco Use   Smoking status: Never    Passive exposure: Never   Smokeless tobacco: Never  Vaping Use   Vaping status: Never Used  Substance and Sexual Activity   Alcohol use: Yes    Comment: occ. 3 per month   Drug use: Never   Sexual activity: Yes

## 2023-02-11 ENCOUNTER — Ambulatory Visit (INDEPENDENT_AMBULATORY_CARE_PROVIDER_SITE_OTHER): Payer: Medicare Other | Admitting: Internal Medicine

## 2023-02-11 ENCOUNTER — Encounter: Payer: Self-pay | Admitting: Internal Medicine

## 2023-02-11 VITALS — BP 118/80 | HR 65 | Temp 98.6°F | Ht 67.5 in | Wt 185.0 lb

## 2023-02-11 DIAGNOSIS — J309 Allergic rhinitis, unspecified: Secondary | ICD-10-CM

## 2023-02-11 DIAGNOSIS — J0100 Acute maxillary sinusitis, unspecified: Secondary | ICD-10-CM | POA: Diagnosis not present

## 2023-02-11 MED ORDER — MONTELUKAST SODIUM 10 MG PO TABS: 10.0000 mg | ORAL_TABLET | ORAL | 1 refills | Status: AC | PRN

## 2023-02-11 MED ORDER — BENZONATATE 200 MG PO CAPS: 200.0000 mg | ORAL_CAPSULE | Freq: Three times a day (TID) | ORAL | 0 refills | Status: DC | PRN

## 2023-02-11 MED ORDER — DOXYCYCLINE HYCLATE 100 MG PO TABS: 100.0000 mg | ORAL_TABLET | Freq: Two times a day (BID) | ORAL | 0 refills | Status: DC

## 2023-02-11 NOTE — Progress Notes (Signed)
Subjective:    Patient ID: Rodney Gray, male    DOB: [birth year], 72 y.o.   MRN: [id number]  HPI Here with wife due to persistent respiratory infection  Travelled to [place]--flew Got back about 10 days ago---and started with symptoms Bad sinus pressure for a few days Sore throat Worsened over the past few days---thought waxes and wanes Nasty nasal drainage and mucus from cough Harsh cough--causing pain in back No fever No SOB  Tried coricidin and cough med Usual allergy meds No headache Sinus pressure is better ---since the green drainage started  Current Outpatient Medications on File Prior to Visit  Medication Sig Dispense Refill   acetaminophen (TYLENOL) 650 MG CR tablet Take 650 mg by mouth at bedtime as needed for pain.     apixaban (ELIQUIS) 5 MG TABS tablet Take 5 mg by mouth 2 (two) times daily.     ARTIFICIAL TEAR SOLUTION OP Place 1 drop into both eyes 3 (three) times daily.     clindamycin (CLEOCIN) 150 MG capsule Take 4 capsules by mouth as needed. Prior to dental appointments     diltiazem (CARDIZEM CD) 180 MG 24 hr capsule TAKE 1 CAPSULE BY MOUTH EVERY DAY 90 capsule 2   methocarbamol (ROBAXIN) 500 MG tablet Take 1 tablet (500 mg total) by mouth at bedtime. 30 tablet 1   montelukast (SINGULAIR) 10 MG tablet TAKE 1 TABLET (10 MG TOTAL) BY MOUTH AS NEEDED. ALLERGIES 90 tablet 1   Multiple Vitamins-Minerals (MENS MULTIVITAMIN PLUS PO) Take 1 capsule by mouth daily.     Omega-3 Fatty Acids (FISH OIL) 1200 MG CAPS Take 1 capsule by mouth daily.     omeprazole (PRILOSEC) 20 MG capsule TAKE 1 CAPSULE BY MOUTH AT BEDTIME. 90 capsule 3   predniSONE (DELTASONE) 1 MG tablet Take 1 tablet (1 mg total) by mouth every other day. Along with a 5 mg tab 45 tablet 3   predniSONE (DELTASONE) 5 MG tablet Take 1-2 tablets (5-10 mg total) by mouth every other day. Wean as directed (Patient taking differently: Take 5 mg by mouth every other day. Wean as directed) 60 tablet 11    tadalafil (CIALIS) 10 MG tablet Take 1 tablet (10 mg total) by mouth daily. OK to take additional 10mg  boost dose as needed 45 minutes prior to sexual activity 90 tablet 4   No current facility-administered medications on file prior to visit.    Allergies  Allergen Reactions   Ampicillin Hives    Has tolerated cephalosporins    Past Medical History:  Diagnosis Date   Allergic rhinitis due to pollen    Atrial fibrillation (HCC)    A-Fib, 1987, 2003, 2005, EPS RX 2005 (A-Fib reocurred)   Atrial fibrillation (HCC)    Basal cell carcinoma    skin CA- squamous and Basal cell   BPH (benign prostatic hypertrophy)    Cataract    Fibromyalgia    History of kidney stones    kidney stones 2 years ago   Hyperlipemia    no per pt   Hypertension    no per pt   Kidney stone    Osteoarthritis    Polymyalgia rheumatica (HCC)    Squamous cell carcinoma in situ     Past Surgical History:  Procedure Laterality Date   ANTERIOR CERVICAL DECOMP/DISCECTOMY FUSION N/A 12/13/2022   Procedure: Anterior Cervical Decompression Fusion  Cervical five-Cervical six - Cervical six-Cervical seven;  Surgeon: Donalee Citrin, MD;  Location: [facility] OR;  Service: Neurosurgery;  Laterality: N/A;   CAPSULOTOMY Bilateral 08/2007   CATARACT EXTRACTION Bilateral    both eyes   COLONOSCOPY     MENISCUS REPAIR Left 09/2009   left knee---Dr [facility]   NASAL SINUS SURGERY  May 2014   RETINAL TEAR REPAIR CRYOTHERAPY Right    right 12/06   RETINAL TEAR REPAIR CRYOTHERAPY Right    Retinal tear/bleed- Right 12/06   SQUAMOUS CELL CARCINOMA EXCISION Left 07/2008   left forearm   THUMB ARTHROSCOPY Left 1999    Family History  Problem Relation Age of Onset   Hypertension Father    Cirrhosis Father 12   Alcohol abuse Father    Gallstones Mother    Hyperlipidemia Mother    Esophageal cancer Brother    Asthma Daughter    Coronary artery disease Neg Hx    Diabetes Neg Hx    Cancer Neg Hx        prostate or colon   Colon  cancer Neg Hx    Rectal cancer Neg Hx    Stomach cancer Neg Hx     Social History   Socioeconomic History   Marital status: Married    Spouse name: Not on file   Number of children: 3   Years of education: Not on file   Highest education level: Not on file  Occupational History   Occupation: retired Mudlogger: RETIRED  Tobacco Use   Smoking status: Never    Passive exposure: Never   Smokeless tobacco: Never  Vaping Use   Vaping status: Never Used  Substance and Sexual Activity   Alcohol use: Yes    Comment: occ. 3 per month   Drug use: Never   Sexual activity: Yes  Other Topics Concern   Not on file  Social History Narrative   Has living will   Wife is health care POA--alternate is son Arlys John   Would accept resuscitation attempts   Would accept tube feeds for some time--depending on prognosis   Social Determinants of Health   Financial Resource Strain: Not on file  Food Insecurity: No Food Insecurity (12/15/2022)   Hunger Vital Sign    Worried About Running Out of Food in the Last Year: Never true    Ran Out of Food in the Last Year: Never true  Transportation Needs: No Transportation Needs (12/15/2022)   PRAPARE - Administrator, Civil Service (Medical): No    Lack of Transportation (Non-Medical): No  Physical Activity: Not on file  Stress: Not on file  Social Connections: Not on file  Intimate Partner Violence: Not on file   Review of Systems Didn't test for COVID No N/V Eating okay    Objective:   Physical Exam Constitutional:      Appearance: Normal appearance.  HENT:     Head:     Comments: Left maxillary tenderness    Mouth/Throat:     Pharynx: No oropharyngeal exudate or posterior oropharyngeal erythema.  Pulmonary:     Effort: Pulmonary effort is normal.     Breath sounds: Normal breath sounds. No wheezing or rales.  Musculoskeletal:     Cervical back: Neck supple.  Lymphadenopathy:     Cervical: No  cervical adenopathy.  Neurological:     Mental Status: He is alert.            Assessment & Plan:

## 2023-02-11 NOTE — Assessment & Plan Note (Signed)
Sick for 10 days or more Will treat with doxy 100 bid x 7 days Refill montelukast --mostly for allergies Benzonatate for cough

## 2023-02-16 ENCOUNTER — Other Ambulatory Visit: Payer: Self-pay | Admitting: Internal Medicine

## 2023-02-17 NOTE — Telephone Encounter (Signed)
Prescription refill request for Eliquis received. Indication: AF Last office visit: 09/27/22  Rosette Reveal MD Scr: 0.81 on 12/03/22  Epic Age: 72 Weight: 83.4kg  Based on above findings Eliquis 5mg  twice daily is the appropriate dose.  Refill approved.

## 2023-02-18 DIAGNOSIS — M799 Soft tissue disorder, unspecified: Secondary | ICD-10-CM | POA: Diagnosis not present

## 2023-02-18 DIAGNOSIS — M25611 Stiffness of right shoulder, not elsewhere classified: Secondary | ICD-10-CM | POA: Diagnosis not present

## 2023-02-18 DIAGNOSIS — M25511 Pain in right shoulder: Secondary | ICD-10-CM | POA: Diagnosis not present

## 2023-02-18 DIAGNOSIS — M6281 Muscle weakness (generalized): Secondary | ICD-10-CM | POA: Diagnosis not present

## 2023-02-22 DIAGNOSIS — M799 Soft tissue disorder, unspecified: Secondary | ICD-10-CM | POA: Diagnosis not present

## 2023-02-22 DIAGNOSIS — M6281 Muscle weakness (generalized): Secondary | ICD-10-CM | POA: Diagnosis not present

## 2023-02-22 DIAGNOSIS — M25511 Pain in right shoulder: Secondary | ICD-10-CM | POA: Diagnosis not present

## 2023-02-22 DIAGNOSIS — M25611 Stiffness of right shoulder, not elsewhere classified: Secondary | ICD-10-CM | POA: Diagnosis not present

## 2023-02-25 DIAGNOSIS — M6281 Muscle weakness (generalized): Secondary | ICD-10-CM | POA: Diagnosis not present

## 2023-02-25 DIAGNOSIS — M799 Soft tissue disorder, unspecified: Secondary | ICD-10-CM | POA: Diagnosis not present

## 2023-02-25 DIAGNOSIS — M25511 Pain in right shoulder: Secondary | ICD-10-CM | POA: Diagnosis not present

## 2023-02-25 DIAGNOSIS — M25611 Stiffness of right shoulder, not elsewhere classified: Secondary | ICD-10-CM | POA: Diagnosis not present

## 2023-02-28 DIAGNOSIS — M25611 Stiffness of right shoulder, not elsewhere classified: Secondary | ICD-10-CM | POA: Diagnosis not present

## 2023-02-28 DIAGNOSIS — M6281 Muscle weakness (generalized): Secondary | ICD-10-CM | POA: Diagnosis not present

## 2023-02-28 DIAGNOSIS — M799 Soft tissue disorder, unspecified: Secondary | ICD-10-CM | POA: Diagnosis not present

## 2023-02-28 DIAGNOSIS — M25511 Pain in right shoulder: Secondary | ICD-10-CM | POA: Diagnosis not present

## 2023-03-01 DIAGNOSIS — M5412 Radiculopathy, cervical region: Secondary | ICD-10-CM | POA: Diagnosis not present

## 2023-03-01 DIAGNOSIS — Z6829 Body mass index (BMI) 29.0-29.9, adult: Secondary | ICD-10-CM | POA: Diagnosis not present

## 2023-03-02 DIAGNOSIS — M9903 Segmental and somatic dysfunction of lumbar region: Secondary | ICD-10-CM | POA: Diagnosis not present

## 2023-03-02 DIAGNOSIS — M62830 Muscle spasm of back: Secondary | ICD-10-CM | POA: Diagnosis not present

## 2023-03-02 DIAGNOSIS — M5136 Other intervertebral disc degeneration, lumbar region with discogenic back pain only: Secondary | ICD-10-CM | POA: Diagnosis not present

## 2023-03-02 DIAGNOSIS — M9902 Segmental and somatic dysfunction of thoracic region: Secondary | ICD-10-CM | POA: Diagnosis not present

## 2023-03-04 DIAGNOSIS — M6281 Muscle weakness (generalized): Secondary | ICD-10-CM | POA: Diagnosis not present

## 2023-03-04 DIAGNOSIS — M25511 Pain in right shoulder: Secondary | ICD-10-CM | POA: Diagnosis not present

## 2023-03-04 DIAGNOSIS — M799 Soft tissue disorder, unspecified: Secondary | ICD-10-CM | POA: Diagnosis not present

## 2023-03-04 DIAGNOSIS — M25611 Stiffness of right shoulder, not elsewhere classified: Secondary | ICD-10-CM | POA: Diagnosis not present

## 2023-03-07 DIAGNOSIS — M6281 Muscle weakness (generalized): Secondary | ICD-10-CM | POA: Diagnosis not present

## 2023-03-07 DIAGNOSIS — M25611 Stiffness of right shoulder, not elsewhere classified: Secondary | ICD-10-CM | POA: Diagnosis not present

## 2023-03-07 DIAGNOSIS — M25511 Pain in right shoulder: Secondary | ICD-10-CM | POA: Diagnosis not present

## 2023-03-07 DIAGNOSIS — M799 Soft tissue disorder, unspecified: Secondary | ICD-10-CM | POA: Diagnosis not present

## 2023-03-09 DIAGNOSIS — M799 Soft tissue disorder, unspecified: Secondary | ICD-10-CM | POA: Diagnosis not present

## 2023-03-09 DIAGNOSIS — M6281 Muscle weakness (generalized): Secondary | ICD-10-CM | POA: Diagnosis not present

## 2023-03-09 DIAGNOSIS — M25511 Pain in right shoulder: Secondary | ICD-10-CM | POA: Diagnosis not present

## 2023-03-09 DIAGNOSIS — M25611 Stiffness of right shoulder, not elsewhere classified: Secondary | ICD-10-CM | POA: Diagnosis not present

## 2023-03-14 DIAGNOSIS — M25611 Stiffness of right shoulder, not elsewhere classified: Secondary | ICD-10-CM | POA: Diagnosis not present

## 2023-03-14 DIAGNOSIS — M799 Soft tissue disorder, unspecified: Secondary | ICD-10-CM | POA: Diagnosis not present

## 2023-03-14 DIAGNOSIS — M6281 Muscle weakness (generalized): Secondary | ICD-10-CM | POA: Diagnosis not present

## 2023-03-14 DIAGNOSIS — M25511 Pain in right shoulder: Secondary | ICD-10-CM | POA: Diagnosis not present

## 2023-03-17 DIAGNOSIS — M25511 Pain in right shoulder: Secondary | ICD-10-CM | POA: Diagnosis not present

## 2023-03-17 DIAGNOSIS — M799 Soft tissue disorder, unspecified: Secondary | ICD-10-CM | POA: Diagnosis not present

## 2023-03-17 DIAGNOSIS — M25611 Stiffness of right shoulder, not elsewhere classified: Secondary | ICD-10-CM | POA: Diagnosis not present

## 2023-03-17 DIAGNOSIS — M6281 Muscle weakness (generalized): Secondary | ICD-10-CM | POA: Diagnosis not present

## 2023-03-22 DIAGNOSIS — M25511 Pain in right shoulder: Secondary | ICD-10-CM | POA: Diagnosis not present

## 2023-03-22 DIAGNOSIS — M799 Soft tissue disorder, unspecified: Secondary | ICD-10-CM | POA: Diagnosis not present

## 2023-03-22 DIAGNOSIS — M6281 Muscle weakness (generalized): Secondary | ICD-10-CM | POA: Diagnosis not present

## 2023-03-22 DIAGNOSIS — M25611 Stiffness of right shoulder, not elsewhere classified: Secondary | ICD-10-CM | POA: Diagnosis not present

## 2023-03-24 DIAGNOSIS — M799 Soft tissue disorder, unspecified: Secondary | ICD-10-CM | POA: Diagnosis not present

## 2023-03-24 DIAGNOSIS — M25611 Stiffness of right shoulder, not elsewhere classified: Secondary | ICD-10-CM | POA: Diagnosis not present

## 2023-03-24 DIAGNOSIS — M25511 Pain in right shoulder: Secondary | ICD-10-CM | POA: Diagnosis not present

## 2023-03-24 DIAGNOSIS — M6281 Muscle weakness (generalized): Secondary | ICD-10-CM | POA: Diagnosis not present

## 2023-03-28 DIAGNOSIS — M6281 Muscle weakness (generalized): Secondary | ICD-10-CM | POA: Diagnosis not present

## 2023-03-28 DIAGNOSIS — M25511 Pain in right shoulder: Secondary | ICD-10-CM | POA: Diagnosis not present

## 2023-03-28 DIAGNOSIS — M799 Soft tissue disorder, unspecified: Secondary | ICD-10-CM | POA: Diagnosis not present

## 2023-03-28 DIAGNOSIS — M25611 Stiffness of right shoulder, not elsewhere classified: Secondary | ICD-10-CM | POA: Diagnosis not present

## 2023-03-29 DIAGNOSIS — M9902 Segmental and somatic dysfunction of thoracic region: Secondary | ICD-10-CM | POA: Diagnosis not present

## 2023-03-29 DIAGNOSIS — M5136 Other intervertebral disc degeneration, lumbar region with discogenic back pain only: Secondary | ICD-10-CM | POA: Diagnosis not present

## 2023-03-29 DIAGNOSIS — M9903 Segmental and somatic dysfunction of lumbar region: Secondary | ICD-10-CM | POA: Diagnosis not present

## 2023-03-29 DIAGNOSIS — M62830 Muscle spasm of back: Secondary | ICD-10-CM | POA: Diagnosis not present

## 2023-04-01 DIAGNOSIS — M25511 Pain in right shoulder: Secondary | ICD-10-CM | POA: Diagnosis not present

## 2023-04-01 DIAGNOSIS — M6281 Muscle weakness (generalized): Secondary | ICD-10-CM | POA: Diagnosis not present

## 2023-04-01 DIAGNOSIS — M799 Soft tissue disorder, unspecified: Secondary | ICD-10-CM | POA: Diagnosis not present

## 2023-04-01 DIAGNOSIS — M25611 Stiffness of right shoulder, not elsewhere classified: Secondary | ICD-10-CM | POA: Diagnosis not present

## 2023-04-04 DIAGNOSIS — M25611 Stiffness of right shoulder, not elsewhere classified: Secondary | ICD-10-CM | POA: Diagnosis not present

## 2023-04-04 DIAGNOSIS — M799 Soft tissue disorder, unspecified: Secondary | ICD-10-CM | POA: Diagnosis not present

## 2023-04-04 DIAGNOSIS — M6281 Muscle weakness (generalized): Secondary | ICD-10-CM | POA: Diagnosis not present

## 2023-04-04 DIAGNOSIS — M25511 Pain in right shoulder: Secondary | ICD-10-CM | POA: Diagnosis not present

## 2023-04-07 ENCOUNTER — Ambulatory Visit: Payer: Medicare Other | Admitting: Gastroenterology

## 2023-04-07 ENCOUNTER — Encounter: Payer: Self-pay | Admitting: Gastroenterology

## 2023-04-07 VITALS — BP 140/68 | HR 68 | Ht 68.0 in | Wt 190.0 lb

## 2023-04-07 DIAGNOSIS — Z800 Family history of malignant neoplasm of digestive organs: Secondary | ICD-10-CM

## 2023-04-07 DIAGNOSIS — I4891 Unspecified atrial fibrillation: Secondary | ICD-10-CM | POA: Diagnosis not present

## 2023-04-07 DIAGNOSIS — K219 Gastro-esophageal reflux disease without esophagitis: Secondary | ICD-10-CM

## 2023-04-07 DIAGNOSIS — Z7902 Long term (current) use of antithrombotics/antiplatelets: Secondary | ICD-10-CM

## 2023-04-07 NOTE — Progress Notes (Addendum)
 [facility] Consult Note:  History: Rodney Gray 04/07/2023  Referring provider: Jimmy Charlie FERNS, MD  Reason for consult/chief complaint: Abdominal Pain (Pt states he get some heart burn a lot of nights.)   Subjective  Prior history: Efosa had his last colonoscopy at this office by Dr. Obie in February 2016.  Diverticulosis and no polyps found. Seen here by Dr. Legrand January 2021 after having had 10 to 14 days of a perianal knot that had resolved by then.  Normal perianal and DRE at that office visit. Office visit here with APP October 2021 for similar findings persisting, found to have 2 small external hemorrhoids treated conservatively.    Discussed the use of AI scribe software for clinical note transcription with the patient, who gave verbal consent to proceed.  History of Present Illness   The patient, with a known history of permanent atrial fibrillation, presented with a complex array of symptoms that began in early May. The patient reported a severe episode of abdominal pain that lasted for several days, accompanied by a significant decrease in oral intake. This episode was severe enough to warrant an ER visit, where a CT scan was performed, revealing no significant findings. The patient also reported severe neck pain and headaches, which led to a neurosurgical intervention involving the placement of a plate and six screws to fuse two to three vertebrae in the neck. This intervention successfully alleviated the neck pain and associated symptoms.  In addition to these issues, the patient has been dealing with a locked right shoulder, for which they are currently undergoing physical therapy. The patient also reported severe abdominal pain and distress, which was initially thought to be food poisoning. However, a subsequent abdominal CT scan did not reveal any significant findings.  He recalls a CT urogram done in 2022 for question of kidney stone that  reports a small hiatal hernia.  The patient was prescribed omeprazole  for control of stomach acid, which was effective, but he was concerned about using this in the long-term.  His main concern today is the report of ongoing issues with acid reflux that may occur both daytime and overnight., In the early morning hours, he frequently passes a lot of gas and a clear liquid, likely mucus. The patient has been managing these symptoms with chewable antacids and anti-gas liquids as needed. The patient also reported a significant level of fatigue throughout the month of May, with energy levels depleting by early evening. There were also reports of feeling overheated and experiencing night sweats during this period.  The patient's brother had a history of esophageal cancer, which raises concerns about the persistent reflux symptoms. The patient has been taking Eliquis , prednisone , and tadalafil , all of which he read can potentially affect stomach health. He denies dysphagia or odynophagia, vomiting (other than during the acute illness in May 2024) or weight loss.   ROS:  Review of Systems  Constitutional:  Negative for appetite change and unexpected weight change.  HENT:  Negative for mouth sores and voice change.   Eyes:  Negative for pain and redness.  Respiratory:  Negative for cough and shortness of breath.   Cardiovascular:  Negative for chest pain and palpitations.  Genitourinary:  Negative for dysuria and hematuria.  Musculoskeletal:  Positive for arthralgias. Negative for myalgias.  Skin:  Negative for pallor and rash.  Neurological:  Negative for weakness and headaches.  Hematological:  Negative for adenopathy.     Past Medical History: Past Medical History:  Diagnosis Date   Allergic rhinitis due to pollen    Atrial fibrillation (HCC)    A-Fib, 1987, 2003, 2005, EPS RX 2005 (A-Fib reocurred)   Atrial fibrillation (HCC)    Basal cell carcinoma    skin CA- squamous and Basal cell    BPH (benign prostatic hypertrophy)    Cataract    Fibromyalgia    History of kidney stones    kidney stones 2 years ago   Hyperlipemia    no per pt   Hypertension    no per pt   Kidney stone    Osteoarthritis    Polymyalgia rheumatica (HCC)    Squamous cell carcinoma in situ    Afib stable when seen by Cardiology/EP 09/27/22  Prednisone  6 mg every other day is for PMR    Past Surgical History: Past Surgical History:  Procedure Laterality Date   ANTERIOR CERVICAL DECOMP/DISCECTOMY FUSION N/A 12/13/2022   Procedure: Anterior Cervical Decompression Fusion  Cervical five-Cervical six - Cervical six-Cervical seven;  Surgeon: Onetha Kuba, MD;  Location: [facility] OR;  Service: Neurosurgery;  Laterality: N/A;   CAPSULOTOMY Bilateral 08/2007   CATARACT EXTRACTION Bilateral    both eyes   COLONOSCOPY     MENISCUS REPAIR Left 09/2009   left knee---Dr [facility]   NASAL SINUS SURGERY  May 2014   RETINAL TEAR REPAIR CRYOTHERAPY Right    right 12/06   RETINAL TEAR REPAIR CRYOTHERAPY Right    Retinal tear/bleed- Right 12/06   SQUAMOUS CELL CARCINOMA EXCISION Left 07/2008   left forearm   THUMB ARTHROSCOPY Left 1999     Family History: Family History  Problem Relation Age of Onset   Hypertension Father    Cirrhosis Father 105   Alcohol abuse Father    Gallstones Mother    Hyperlipidemia Mother    Esophageal cancer Brother    Asthma Daughter    Coronary artery disease Neg Hx    Diabetes Neg Hx    Cancer Neg Hx        prostate or colon   Colon cancer Neg Hx    Rectal cancer Neg Hx    Stomach cancer Neg Hx     Social History: Social History   Socioeconomic History   Marital status: Married    Spouse name: Not on file   Number of children: 3   Years of education: Not on file   Highest education level: Not on file  Occupational History   Occupation: retired [employer]: RETIRED  Tobacco Use   Smoking status: Never    Passive exposure: Never    Smokeless tobacco: Never  Vaping Use   Vaping status: Never Used  Substance and Sexual Activity   Alcohol use: Yes    Comment: occ. 3 per month   Drug use: Never   Sexual activity: Yes  Other Topics Concern   Not on file  Social History Narrative   Has living will   Wife is health care POA--alternate is son Redell   Would accept resuscitation attempts   Would accept tube feeds for some time--depending on prognosis   Social Drivers of Health   Financial Resource Strain: Not on file  Food Insecurity: No Food Insecurity (12/15/2022)   Hunger Vital Sign    Worried About Running Out of Food in the Last Year: Never true    Ran Out of Food in the Last Year: Never true  Transportation Needs: No Transportation Needs (12/15/2022)   PRAPARE -  Administrator, Civil Service (Medical): No    Lack of Transportation (Non-Medical): No  Physical Activity: Not on file  Stress: Not on file  Social Connections: Not on file    Allergies: Allergies  Allergen Reactions   Ampicillin Hives    Has tolerated cephalosporins    Outpatient Meds: Current Outpatient Medications  Medication Sig Dispense Refill   acetaminophen  (TYLENOL ) 650 MG CR tablet Take 650 mg by mouth at bedtime as needed for pain.     apixaban  (ELIQUIS ) 5 MG TABS tablet TAKE 1 TABLET BY MOUTH TWICE  DAILY 180 tablet 1   ARTIFICIAL TEAR SOLUTION OP Place 1 drop into both eyes 3 (three) times daily.     benzonatate  (TESSALON ) 200 MG capsule Take 1 capsule (200 mg total) by mouth 3 (three) times daily as needed for cough. 60 capsule 0   clindamycin  (CLEOCIN ) 150 MG capsule Take 4 capsules by mouth as needed. Prior to dental appointments     diltiazem  (CARDIZEM  CD) 180 MG 24 hr capsule TAKE 1 CAPSULE BY MOUTH EVERY DAY 90 capsule 2   doxycycline  (VIBRA -TABS) 100 MG tablet Take 1 tablet (100 mg total) by mouth 2 (two) times daily. 14 tablet 0   methocarbamol  (ROBAXIN ) 500 MG tablet Take 1 tablet (500 mg total) by mouth at  bedtime. 30 tablet 1   montelukast  (SINGULAIR ) 10 MG tablet Take 1 tablet (10 mg total) by mouth as needed. Allergies 90 tablet 1   Multiple Vitamins-Minerals (MENS MULTIVITAMIN PLUS PO) Take 1 capsule by mouth daily.     Omega-3 Fatty Acids (FISH OIL) 1200 MG CAPS Take 1 capsule by mouth daily.     omeprazole  (PRILOSEC) 20 MG capsule TAKE 1 CAPSULE BY MOUTH AT BEDTIME. 90 capsule 3   predniSONE  (DELTASONE ) 1 MG tablet Take 1 tablet (1 mg total) by mouth every other day. Along with a 5 mg tab 45 tablet 3   predniSONE  (DELTASONE ) 5 MG tablet Take 1-2 tablets (5-10 mg total) by mouth every other day. Wean as directed (Patient taking differently: Take 5 mg by mouth every other day. Wean as directed) 60 tablet 11   tadalafil  (CIALIS ) 10 MG tablet Take 1 tablet (10 mg total) by mouth daily. OK to take additional 10mg  boost dose as needed 45 minutes prior to sexual activity 90 tablet 4   No current facility-administered medications for this visit.      ___________________________________________________________________ Objective   Exam:  BP (!) 140/68   Pulse 68   Ht 5' 8 (1.727 m)   Wt 190 lb (86.2 kg)   BMI 28.89 kg/m  Wt Readings from Last 3 Encounters:  04/07/23 190 lb (86.2 kg)  02/11/23 185 lb (83.9 kg)  12/23/22 180 lb (81.6 kg)    General: Well-appearing, normal vocal quality Eyes: sclera anicteric, no redness ENT: oral mucosa moist without lesions, no cervical or supraclavicular lymphadenopathy CV: Irregular, no JVD, no peripheral edema Resp: clear to auscultation bilaterally, normal RR and effort noted GI: soft, no tenderness, with active bowel sounds. No guarding or palpable organomegaly noted. Skin; warm and dry, no rash or jaundice noted Neuro: awake, alert and oriented x 3. Normal gross motor function and fluent speech   Labs:     Latest Ref Rng & Units 12/03/2022   10:14 AM 08/05/2022    5:40 PM 10/21/2021    8:12 AM  CBC  WBC 4.0 - 10.5 K/uL 7.4  6.5  5.7    Hemoglobin 13.0 -  17.0 g/dL 85.8  85.3  85.7   Hematocrit 39.0 - 52.0 % 42.2  43.8  43.0   Platelets 150 - 400 K/uL 236  239  256.0       Latest Ref Rng & Units 12/03/2022   10:14 AM 08/05/2022    5:40 PM 10/21/2021    8:12 AM  CMP  Glucose 70 - 99 mg/dL 94  94  90   BUN 8 - 23 mg/dL 15  18  15    Creatinine 0.61 - 1.24 mg/dL 9.18  9.22  9.11   Sodium 135 - 145 mmol/L 134  137  139   Potassium 3.5 - 5.1 mmol/L 4.1  4.2  4.7   Chloride 98 - 111 mmol/L 100  103  103   CO2 22 - 32 mmol/L 24  27  29    Calcium 8.9 - 10.3 mg/dL 9.2  9.8  9.4   Total Protein 6.5 - 8.1 g/dL  6.9  6.7   Total Bilirubin 0.3 - 1.2 mg/dL  0.8  0.9   Alkaline Phos 38 - 126 U/L  48  52   AST 15 - 41 U/L  19  16   ALT 0 - 44 U/L  16  13    Negative H pylori stool antigen  Radiologic Studies:  CLINICAL DATA:  Epigastric pain.  Nephrolithiasis.  Diverticulosis.   EXAM: CT ABDOMEN AND PELVIS WITH CONTRAST   TECHNIQUE: Multidetector CT imaging of the abdomen and pelvis was performed using the standard protocol following bolus administration of intravenous contrast.   RADIATION DOSE REDUCTION: This exam was performed according to the departmental dose-optimization program which includes automated exposure control, adjustment of the mA and/or kV according to patient size and/or use of iterative reconstruction technique.   CONTRAST:  OMNIPAQUE  IOHEXOL  300 MG/ML  SOLN   COMPARISON:  Noncontrast CT on 11/28/2020   FINDINGS: Lower Chest: No acute findings.   Hepatobiliary: No hepatic masses identified. Tiny left hepatic lobe cyst again noted. Gallbladder is unremarkable. No evidence of biliary ductal dilatation.   Pancreas: 1.4 cm simple appearing cyst is seen in the pancreatic tail on image 29/2, which was probably present on previous noncontrast exam. No other pancreatic lesions identified. No evidence of pancreatic ductal dilatation or peripancreatic inflammatory changes.   Spleen: Within  normal limits in size and appearance.   Adrenals/Urinary Tract: No suspicious masses identified. Benign-appearing bilateral renal cysts again noted (No followup imaging is recommended). No evidence of ureteral calculi or hydronephrosis. Unremarkable unopacified urinary bladder.   Stomach/Bowel: No evidence of obstruction, inflammatory process or abnormal fluid collections. Normal appendix visualized. Diverticulosis is seen mainly involving the sigmoid colon, however there is no evidence of diverticulitis.   Vascular/Lymphatic: No pathologically enlarged lymph nodes. No acute vascular findings. Aortic atherosclerotic calcification incidentally noted.   Reproductive:  No mass or other significant abnormality.   Other:  None.   Musculoskeletal:  No suspicious bone lesions identified.   IMPRESSION: No acute findings. No evidence of ureteral calculi or hydronephrosis.   Colonic diverticulosis, without radiographic evidence of diverticulitis.   1.4 cm simple appearing cyst in pancreatic tail, likely present on previous noncontrast exam and suspicious for indolent side-branch IPMN. Recommend continued imaging follow-up with abdomen MRI without and with contrast in 1 year. This recommendation follows ACR consensus guidelines: Management of Incidental Pancreatic Cysts: A White Paper of the ACR Incidental Findings Committee. J Am Coll Radiol 2017;14:911-923.   Aortic Atherosclerosis (ICD10-I70.0).     Electronically Signed  By: Norleen DELENA Kil M.D.   On: 09/04/2022 17:06     ___________________________________ Echo July 2024  1. Left ventricular ejection fraction, by estimation, is 60 to 65%. The  left ventricle has normal function. The left ventricle has no regional  wall motion abnormalities. Left ventricular diastolic parameters are  indeterminate.   2. Right ventricular systolic function is normal. The right ventricular  size is normal. There is normal pulmonary artery  systolic pressure. The  estimated right ventricular systolic pressure is 33.1 mmHg.   3. Left atrial size was severely dilated.   4. Right atrial size was severely dilated.   5. The mitral valve is normal in structure. Moderate mitral valve  regurgitation. No evidence of mitral stenosis.   6. The aortic valve is normal in structure. Aortic valve regurgitation is  mild. No aortic stenosis is present. Aortic valve mean gradient measures  4.6 mmHg.   7. The inferior vena cava is normal in size with greater than 50%  respiratory variability, suggesting right atrial pressure of 3 mmHg.   Encounter Diagnoses  Name Primary?   Gastroesophageal reflux disease, unspecified whether esophagitis present Yes   Long term (current) use of antithrombotics/antiplatelets    He had what sounds like an acute gastroenteritis in May 2024.  Sounds like there may have been some intermittent reflux symptoms before that, but they became more severe and frequent since then.  This is causing him understandable concerned about the underlying cause and the best management, especially given a brother who had esophageal cancer. It sounds like the acute infectious illness has triggered some lasting digestive symptoms as well as chronic fatigue, which is why he has been questioning long COVID. Assessment and Plan    Gastroesophageal Reflux Disease (GERD) Persistent acid reflux symptoms despite Omeprazole  use. Morning episodes of excessive gas and mucus production. Family history of esophageal cancer. -Schedule upper endoscopy to evaluate for esophagitis or precancerous changes. -Continue Omeprazole  and lifestyle modifications (avoiding food triggers, not eating within several hours of bedtime). Written dietary advice was given.  Try to elevate head of bed if possible, though he finds this difficult since he sleeps on his side. Abdominal Pain Severe abdominal pain in May, possibly due to gastroenteritis. No clear cause  identified on CT scan. -Continue monitoring symptoms.  Atrial Fibrillation Stable on Eliquis . -Coordinate with cardiologist to briefly hold Eliquis  36 hours prior to scheduled endoscopy.  Will get clearance from cardiology.  CVA risk low and I believe acceptable over a hold of under 72 hours.  Colonoscopy Last colonoscopy in February 2016 with no polyps found. -Plan for next colonoscopy in February 2026 per current screening guidelines.     Plan:  After the EGD, we can then discuss best long-term medical management of his reflux symptoms. We discussed the limitation of acid suppression therapy, breadth of diet and lifestyle changes required for reflux control, the possibility of reflux related complications such as esophagitis, stricture or Barrett's esophagus.  Other anatomic considerations such as gastric outlet obstruction or hiatal hernia may contribute to reflux.  Next best thing would probably be de-escalation to H2 blocker because of his concerns regarding long-term PPI use.  He is currently taking the PPI episodically when he gets symptoms, and he can continue that until the current supply runs out.  If possible, would be best to have EGD performed when he is not on regular acid suppression for the purposes of evaluating esophagitis.  Thank you for the courtesy of this consult.  Please  call me with any questions or concerns.  Victory LITTIE Brand III  CC: Referring provider noted above

## 2023-04-07 NOTE — Patient Instructions (Addendum)
 You have been scheduled for an endoscopy. Please follow written instructions given to you at your visit today.  If you use inhalers (even only as needed), please bring them with you on the day of your procedure.  If you take any of the following medications, they will need to be adjusted prior to your procedure:   DO NOT TAKE 7 DAYS PRIOR TO TEST- Trulicity (dulaglutide) Ozempic, Wegovy (semaglutide) Mounjaro (tirzepatide) Bydureon Bcise (exanatide extended release)  DO NOT TAKE 1 DAY PRIOR TO YOUR TEST Rybelsus (semaglutide) Adlyxin (lixisenatide) Victoza (liraglutide) Byetta (exanatide) ___________________________________________________________________________     _______________________________________________________  If your blood pressure at your visit was 140/90 or greater, please contact your primary care physician to follow up on this.  _______________________________________________________  If you are age 56 or older, your body mass index should be between 23-30. Your Body mass index is 28.89 kg/m. If this is out of the aforementioned range listed, please consider follow up with your Primary Care Provider.  If you are age 67 or younger, your body mass index should be between 19-25. Your Body mass index is 28.89 kg/m. If this is out of the aformentioned range listed, please consider follow up with your Primary Care Provider.   ________________________________________________________  The [facility] providers would like to encourage you to use MYCHART to communicate with providers for non-urgent requests or questions.  Due to long hold times on the telephone, sending your provider a message by [facility] may be a faster and more efficient way to get a response.  Please allow 48 business hours for a response.  Please remember that this is for non-urgent requests.  _______________________________________________________ It was a pleasure to see you today!  Thank you for  trusting me with your gastrointestinal care!

## 2023-04-08 DIAGNOSIS — M25611 Stiffness of right shoulder, not elsewhere classified: Secondary | ICD-10-CM | POA: Diagnosis not present

## 2023-04-08 DIAGNOSIS — M799 Soft tissue disorder, unspecified: Secondary | ICD-10-CM | POA: Diagnosis not present

## 2023-04-08 DIAGNOSIS — M25511 Pain in right shoulder: Secondary | ICD-10-CM | POA: Diagnosis not present

## 2023-04-08 DIAGNOSIS — M6281 Muscle weakness (generalized): Secondary | ICD-10-CM | POA: Diagnosis not present

## 2023-04-11 DIAGNOSIS — M6281 Muscle weakness (generalized): Secondary | ICD-10-CM | POA: Diagnosis not present

## 2023-04-11 DIAGNOSIS — M799 Soft tissue disorder, unspecified: Secondary | ICD-10-CM | POA: Diagnosis not present

## 2023-04-11 DIAGNOSIS — M25511 Pain in right shoulder: Secondary | ICD-10-CM | POA: Diagnosis not present

## 2023-04-11 DIAGNOSIS — M25611 Stiffness of right shoulder, not elsewhere classified: Secondary | ICD-10-CM | POA: Diagnosis not present

## 2023-04-13 DIAGNOSIS — L578 Other skin changes due to chronic exposure to nonionizing radiation: Secondary | ICD-10-CM | POA: Diagnosis not present

## 2023-04-13 DIAGNOSIS — D0439 Carcinoma in situ of skin of other parts of face: Secondary | ICD-10-CM | POA: Diagnosis not present

## 2023-04-13 DIAGNOSIS — Z859 Personal history of malignant neoplasm, unspecified: Secondary | ICD-10-CM | POA: Diagnosis not present

## 2023-04-13 DIAGNOSIS — D485 Neoplasm of uncertain behavior of skin: Secondary | ICD-10-CM | POA: Diagnosis not present

## 2023-04-13 DIAGNOSIS — Z872 Personal history of diseases of the skin and subcutaneous tissue: Secondary | ICD-10-CM | POA: Diagnosis not present

## 2023-04-13 DIAGNOSIS — L570 Actinic keratosis: Secondary | ICD-10-CM | POA: Diagnosis not present

## 2023-04-18 DIAGNOSIS — M6281 Muscle weakness (generalized): Secondary | ICD-10-CM | POA: Diagnosis not present

## 2023-04-18 DIAGNOSIS — M799 Soft tissue disorder, unspecified: Secondary | ICD-10-CM | POA: Diagnosis not present

## 2023-04-18 DIAGNOSIS — M25511 Pain in right shoulder: Secondary | ICD-10-CM | POA: Diagnosis not present

## 2023-04-18 DIAGNOSIS — M25611 Stiffness of right shoulder, not elsewhere classified: Secondary | ICD-10-CM | POA: Diagnosis not present

## 2023-04-22 DIAGNOSIS — M25611 Stiffness of right shoulder, not elsewhere classified: Secondary | ICD-10-CM | POA: Diagnosis not present

## 2023-04-22 DIAGNOSIS — M6281 Muscle weakness (generalized): Secondary | ICD-10-CM | POA: Diagnosis not present

## 2023-04-22 DIAGNOSIS — M799 Soft tissue disorder, unspecified: Secondary | ICD-10-CM | POA: Diagnosis not present

## 2023-04-22 DIAGNOSIS — M25511 Pain in right shoulder: Secondary | ICD-10-CM | POA: Diagnosis not present

## 2023-04-25 DIAGNOSIS — M25511 Pain in right shoulder: Secondary | ICD-10-CM | POA: Diagnosis not present

## 2023-04-25 DIAGNOSIS — M799 Soft tissue disorder, unspecified: Secondary | ICD-10-CM | POA: Diagnosis not present

## 2023-04-25 DIAGNOSIS — M25611 Stiffness of right shoulder, not elsewhere classified: Secondary | ICD-10-CM | POA: Diagnosis not present

## 2023-04-25 DIAGNOSIS — M6281 Muscle weakness (generalized): Secondary | ICD-10-CM | POA: Diagnosis not present

## 2023-04-29 DIAGNOSIS — M25511 Pain in right shoulder: Secondary | ICD-10-CM | POA: Diagnosis not present

## 2023-04-29 DIAGNOSIS — M25611 Stiffness of right shoulder, not elsewhere classified: Secondary | ICD-10-CM | POA: Diagnosis not present

## 2023-04-29 DIAGNOSIS — M799 Soft tissue disorder, unspecified: Secondary | ICD-10-CM | POA: Diagnosis not present

## 2023-04-29 DIAGNOSIS — M6281 Muscle weakness (generalized): Secondary | ICD-10-CM | POA: Diagnosis not present

## 2023-05-02 DIAGNOSIS — M25511 Pain in right shoulder: Secondary | ICD-10-CM | POA: Diagnosis not present

## 2023-05-02 DIAGNOSIS — M6281 Muscle weakness (generalized): Secondary | ICD-10-CM | POA: Diagnosis not present

## 2023-05-02 DIAGNOSIS — M25611 Stiffness of right shoulder, not elsewhere classified: Secondary | ICD-10-CM | POA: Diagnosis not present

## 2023-05-02 DIAGNOSIS — M799 Soft tissue disorder, unspecified: Secondary | ICD-10-CM | POA: Diagnosis not present

## 2023-05-03 DIAGNOSIS — M62830 Muscle spasm of back: Secondary | ICD-10-CM | POA: Diagnosis not present

## 2023-05-03 DIAGNOSIS — M9902 Segmental and somatic dysfunction of thoracic region: Secondary | ICD-10-CM | POA: Diagnosis not present

## 2023-05-03 DIAGNOSIS — M5136 Other intervertebral disc degeneration, lumbar region with discogenic back pain only: Secondary | ICD-10-CM | POA: Diagnosis not present

## 2023-05-03 DIAGNOSIS — M9903 Segmental and somatic dysfunction of lumbar region: Secondary | ICD-10-CM | POA: Diagnosis not present

## 2023-05-06 DIAGNOSIS — M799 Soft tissue disorder, unspecified: Secondary | ICD-10-CM | POA: Diagnosis not present

## 2023-05-06 DIAGNOSIS — M25511 Pain in right shoulder: Secondary | ICD-10-CM | POA: Diagnosis not present

## 2023-05-06 DIAGNOSIS — M25611 Stiffness of right shoulder, not elsewhere classified: Secondary | ICD-10-CM | POA: Diagnosis not present

## 2023-05-06 DIAGNOSIS — M6281 Muscle weakness (generalized): Secondary | ICD-10-CM | POA: Diagnosis not present

## 2023-05-09 DIAGNOSIS — M799 Soft tissue disorder, unspecified: Secondary | ICD-10-CM | POA: Diagnosis not present

## 2023-05-09 DIAGNOSIS — M25611 Stiffness of right shoulder, not elsewhere classified: Secondary | ICD-10-CM | POA: Diagnosis not present

## 2023-05-09 DIAGNOSIS — M25511 Pain in right shoulder: Secondary | ICD-10-CM | POA: Diagnosis not present

## 2023-05-09 DIAGNOSIS — M6281 Muscle weakness (generalized): Secondary | ICD-10-CM | POA: Diagnosis not present

## 2023-05-10 ENCOUNTER — Encounter: Payer: Self-pay | Admitting: Certified Registered Nurse Anesthetist

## 2023-05-11 ENCOUNTER — Ambulatory Visit (AMBULATORY_SURGERY_CENTER): Payer: Medicare Other | Admitting: Gastroenterology

## 2023-05-11 ENCOUNTER — Encounter: Payer: Self-pay | Admitting: Gastroenterology

## 2023-05-11 VITALS — BP 105/66 | HR 58 | Temp 98.4°F | Resp 14 | Ht 68.0 in | Wt 190.0 lb

## 2023-05-11 DIAGNOSIS — I10 Essential (primary) hypertension: Secondary | ICD-10-CM | POA: Diagnosis not present

## 2023-05-11 DIAGNOSIS — K219 Gastro-esophageal reflux disease without esophagitis: Secondary | ICD-10-CM

## 2023-05-11 DIAGNOSIS — M797 Fibromyalgia: Secondary | ICD-10-CM | POA: Diagnosis not present

## 2023-05-11 DIAGNOSIS — K2100 Gastro-esophageal reflux disease with esophagitis, without bleeding: Secondary | ICD-10-CM | POA: Diagnosis not present

## 2023-05-11 DIAGNOSIS — Z800 Family history of malignant neoplasm of digestive organs: Secondary | ICD-10-CM | POA: Diagnosis not present

## 2023-05-11 DIAGNOSIS — I4891 Unspecified atrial fibrillation: Secondary | ICD-10-CM | POA: Diagnosis not present

## 2023-05-11 MED ORDER — OMEPRAZOLE 40 MG PO CPDR: 40.0000 mg | DELAYED_RELEASE_CAPSULE | Freq: Every day | ORAL | 3 refills | Status: DC

## 2023-05-11 MED ORDER — SODIUM CHLORIDE 0.9 % IV SOLN: 500.0000 mL | Freq: Once | INTRAVENOUS | Status: DC

## 2023-05-11 NOTE — Op Note (Signed)
 [facility] Patient Name: Rodney Gray Procedure Date: 05/11/2023 10:48 AM MRN: [id number] Endoscopist: Victory L. Legrand , MD, [id number] Age: 73 Referring MD:  Date of Birth: [birth year] Gender: Male Account #: [id number] Procedure:                Upper GI endoscopy Indications:              Esophageal reflux symptoms that persist despite                            appropriate therapy, Family history of esophageal                            cancer (brother) Medicines:                Monitored Anesthesia Care Procedure:                Pre-Anesthesia Assessment:                           - Prior to the procedure, a History and Physical                            was performed, and patient medications and                            allergies were reviewed. The patient's tolerance of                            previous anesthesia was also reviewed. The risks                            and benefits of the procedure and the sedation                            options and risks were discussed with the patient.                            All questions were answered, and informed consent                            was obtained. Prior Anticoagulants: The patient has                            taken Eliquis  (apixaban ), last dose was 2 days                            prior to procedure. ASA Grade Assessment: II - A                            patient with mild systemic disease. After reviewing                            the risks and benefits, the patient was deemed in  satisfactory condition to undergo the procedure.                           After obtaining informed consent, the endoscope was                            passed under direct vision. Throughout the                            procedure, the patient's blood pressure, pulse, and                            oxygen saturations were monitored continuously. The                            Olympus Scope  SN [id number] was introduced through the                            mouth, and advanced to the second part of duodenum.                            The upper GI endoscopy was accomplished without                            difficulty. The patient tolerated the procedure                            well. Scope In: Scope Out: Findings:                 Savary-Miller Grade II (multiple lesions and folds,                            noncircumferential, with or without confluence)                            esophagitis with no bleeding was found in the lower                            third of the esophagus.                           The exam of the esophagus was otherwise normal. (no                            stricture or Barrett's)                           The stomach was normal.                           The cardia and gastric fundus were normal on                            retroflexion. (Hill grade 3)  The examined duodenum was normal. Complications:            No immediate complications. Estimated Blood Loss:     Estimated blood loss: none. Impression:               - Savary-Miller Grade II reflux esophagitis with no                            bleeding.                           - Normal stomach.                           - Normal examined duodenum.                           - No specimens collected. Recommendation:           - Patient has a contact number available for                            emergencies. The signs and symptoms of potential                            delayed complications were discussed with the                            patient. Return to normal activities tomorrow.                            Written discharge instructions were provided to the                            patient.                           - Resume Eliquis  (apixaban ) at prior dose today.                           - Increase omeprzole from 20 mg at bedtime to 40 mg                             daily at evening meal.                           Disp#40, RF 3                           Return to see me in clinic.                           Continue to follow GERD diet and lifestyle                            recommendations. Bailea Beed L. Legrand, MD 05/11/2023 11:13:46 AM This report has been signed electronically.

## 2023-05-11 NOTE — Progress Notes (Signed)
 History and Physical:  This patient presents for endoscopic testing for: Encounter Diagnoses  Name Primary?   Gastroesophageal reflux disease, unspecified whether esophagitis present Yes   Family history of esophageal cancer     Clinical details in 04/07/23 office consult note.   GERD symptoms persisting despite therapy.  Brother had esophageal CA  Patient is otherwise without complaints or active issues today.   Past Medical History: Past Medical History:  Diagnosis Date   Allergic rhinitis due to pollen    Atrial fibrillation (HCC)    A-Fib, 1987, 2003, 2005, EPS RX 2005 (A-Fib reocurred)   Atrial fibrillation (HCC)    Basal cell carcinoma    skin CA- squamous and Basal cell   BPH (benign prostatic hypertrophy)    Cataract    Fibromyalgia    History of kidney stones    kidney stones 2 years ago   Hyperlipemia    no per pt   Hypertension    no per pt   Kidney stone    Osteoarthritis    Polymyalgia rheumatica (HCC)    Squamous cell carcinoma in situ      Past Surgical History: Past Surgical History:  Procedure Laterality Date   ANTERIOR CERVICAL DECOMP/DISCECTOMY FUSION N/A 12/13/2022   Procedure: Anterior Cervical Decompression Fusion  Cervical five-Cervical six - Cervical six-Cervical seven;  Surgeon: Onetha Kuba, MD;  Location: [facility] OR;  Service: Neurosurgery;  Laterality: N/A;   CAPSULOTOMY Bilateral 08/2007   CATARACT EXTRACTION Bilateral    both eyes   COLONOSCOPY     MENISCUS REPAIR Left 09/2009   left knee---Dr [facility]   NASAL SINUS SURGERY  May 2014   RETINAL TEAR REPAIR CRYOTHERAPY Right    right 12/06   RETINAL TEAR REPAIR CRYOTHERAPY Right    Retinal tear/bleed- Right 12/06   SQUAMOUS CELL CARCINOMA EXCISION Left 07/2008   left forearm   THUMB ARTHROSCOPY Left 1999    Allergies: Allergies  Allergen Reactions   Ampicillin Hives    Has tolerated cephalosporins    Outpatient Meds: Current Outpatient Medications  Medication Sig Dispense Refill    acetaminophen  (TYLENOL ) 650 MG CR tablet Take 650 mg by mouth at bedtime as needed for pain.     ARTIFICIAL TEAR SOLUTION OP Place 1 drop into both eyes 3 (three) times daily.     diltiazem  (CARDIZEM  CD) 180 MG 24 hr capsule TAKE 1 CAPSULE BY MOUTH EVERY DAY 90 capsule 2   Multiple Vitamins-Minerals (MENS MULTIVITAMIN PLUS PO) Take 1 capsule by mouth daily.     Omega-3 Fatty Acids (FISH OIL) 1200 MG CAPS Take 1 capsule by mouth daily.     predniSONE  (DELTASONE ) 1 MG tablet Take 1 tablet (1 mg total) by mouth every other day. Along with a 5 mg tab 45 tablet 3   predniSONE  (DELTASONE ) 5 MG tablet Take 1-2 tablets (5-10 mg total) by mouth every other day. Wean as directed (Patient taking differently: Take 5 mg by mouth every other day. Wean as directed) 60 tablet 11   tadalafil  (CIALIS ) 10 MG tablet Take 1 tablet (10 mg total) by mouth daily. OK to take additional 10mg  boost dose as needed 45 minutes prior to sexual activity 90 tablet 4   apixaban  (ELIQUIS ) 5 MG TABS tablet TAKE 1 TABLET BY MOUTH TWICE  DAILY 180 tablet 1   benzonatate  (TESSALON ) 200 MG capsule Take 1 capsule (200 mg total) by mouth 3 (three) times daily as needed for cough. 60 capsule 0   clindamycin  (CLEOCIN )  150 MG capsule Take 4 capsules by mouth as needed. Prior to dental appointments     doxycycline  (VIBRA -TABS) 100 MG tablet Take 1 tablet (100 mg total) by mouth 2 (two) times daily. (Patient not taking: Reported on 05/11/2023) 14 tablet 0   methocarbamol  (ROBAXIN ) 500 MG tablet Take 1 tablet (500 mg total) by mouth at bedtime. (Patient not taking: Reported on 05/11/2023) 30 tablet 1   montelukast  (SINGULAIR ) 10 MG tablet Take 1 tablet (10 mg total) by mouth as needed. Allergies 90 tablet 1   omeprazole  (PRILOSEC) 20 MG capsule TAKE 1 CAPSULE BY MOUTH AT BEDTIME. 90 capsule 3   Current Facility-Administered Medications  Medication Dose Route Frequency Provider Last Rate Last Admin   0.9 %  sodium chloride  infusion  500 mL  Intravenous Once Danis, Tymia Streb L III, MD          ___________________________________________________________________ Objective   Exam:  BP (!) 123/54   Pulse 67   Temp 98.4 F (36.9 C) (Temporal)   Ht 5' 8 (1.727 m)   Wt 190 lb (86.2 kg)   SpO2 98%   BMI 28.89 kg/m   CV: regular , S1/S2 Resp: clear to auscultation bilaterally, normal RR and effort noted GI: soft, no tenderness, with active bowel sounds.   Assessment: Encounter Diagnoses  Name Primary?   Gastroesophageal reflux disease, unspecified whether esophagitis present Yes   Family history of esophageal cancer      Plan: EGD  The benefits and risks of the planned procedure were described in detail with the patient or (when appropriate) their health care proxy.  Risks were outlined as including, but not limited to, bleeding, infection, perforation, adverse medication reaction leading to cardiac or pulmonary decompensation, pancreatitis (if ERCP).  The limitation of incomplete mucosal visualization was also discussed.  No guarantees or warranties were given.  The patient is appropriate for an endoscopic procedure in the ambulatory setting.   - Victory Brand, MD

## 2023-05-11 NOTE — Progress Notes (Signed)
1054 Robinul 0.1 mg IV given due large amount of secretions upon assessment.  MD made aware, vss  °

## 2023-05-11 NOTE — Progress Notes (Signed)
 Report given to PACU, vss

## 2023-05-11 NOTE — Patient Instructions (Addendum)
 - Resume Eliquis  (apixaban ) at prior dose today.                           - Increase omeprazole  from 20 mg at bedtime to 40 mg                            daily at evening meal.                           Disp#40, RF 3                           Return to see me in clinic.                           Continue to follow GERD diet and lifestyle                            recommendations.   YOU HAD AN ENDOSCOPIC PROCEDURE TODAY AT THE [facility]:   Refer to the procedure report that was given to you for any specific questions about what was found during the examination.  If the procedure report does not answer your questions, please call your gastroenterologist to clarify.  If you requested that your care partner not be given the details of your procedure findings, then the procedure report has been included in a sealed envelope for you to review at your convenience later.  YOU SHOULD EXPECT: Some feelings of bloating in the abdomen. Passage of more gas than usual.  Walking can help get rid of the air that was put into your GI tract during the procedure and reduce the bloating. If you had a lower endoscopy (such as a colonoscopy or flexible sigmoidoscopy) you may notice spotting of blood in your stool or on the toilet paper. If you underwent a bowel prep for your procedure, you may not have a normal bowel movement for a few days.  Please Note:  You might notice some irritation and congestion in your nose or some drainage.  This is from the oxygen used during your procedure.  There is no need for concern and it should clear up in a day or so.  SYMPTOMS TO REPORT IMMEDIATELY:  Following upper endoscopy (EGD)  Vomiting of blood or coffee ground material  New chest pain or pain under the shoulder blades  Painful or persistently difficult swallowing  New shortness of breath  Fever of 100F or higher  Black, tarry-looking stools  For urgent or emergent issues, a  gastroenterologist can be reached at any hour by calling [phone]. Do not use MyChart messaging for urgent concerns.    DIET:  We do recommend a small meal at first, but then you may proceed to your regular diet.  Drink plenty of fluids but you should avoid alcoholic beverages for 24 hours.  ACTIVITY:  You should plan to take it easy for the rest of today and you should NOT DRIVE or use heavy machinery until tomorrow (because of the sedation medicines used during the test).    FOLLOW UP: Our staff will call the number listed on your records the next business day following your procedure.  We will call around 7:15- 8:00 am to check on you and  address any questions or concerns that you may have regarding the information given to you following your procedure. If we do not reach you, we will leave a message.     If any biopsies were taken you will be contacted by phone or by letter within the next 1-3 weeks.  Please call us  at [phone] if you have not heard about the biopsies in 3 weeks.    SIGNATURES/CONFIDENTIALITY: You and/or your care partner have signed paperwork which will be entered into your electronic medical record.  These signatures attest to the fact that that the information above on your After Visit Summary has been reviewed and is understood.  Full responsibility of the confidentiality of this discharge information lies with you and/or your care-partner.  GERD in Adults: What to Know  Gastroesophageal reflux (GER) is when acid from your stomach flows up into your esophagus. Your esophagus is the part of your body that moves food from your mouth to your stomach. Normally, food goes down and stays in your stomach to be digested. But with GER, food and stomach acid may go back up. You may have a disease called gastroesophageal reflux disease (GERD) if the reflux: Happens often. Causes very bad symptoms. Makes your esophagus sore and swollen. Over time, GERD can make small  holes called ulcers in the lining of your esophagus. What are the causes? GERD is caused by a problem with the muscle between your esophagus and stomach. This muscle is called the lower esophageal sphincter (LES). When it's weak or not normal, it doesn't close like it should. This means food and stomach acid can go back up into your esophagus. The muscle can be weak if: You smoke or use products with tobacco in them. You're pregnant. You have a type of hernia called a hiatal hernia. You eat certain foods and drinks. These include: Alcohol. Coffee. Chocolate. Onions. Peppermint. What increases the risk? Being overweight. Having a disease that affects your connective tissue. Taking NSAIDs, such as ibuprofen. What are the signs or symptoms? Heartburn. Trouble swallowing. Pain when you swallow. The feeling of having a lump in your throat. A bitter taste in your mouth. Bad breath. Having an upset or bloated stomach. Burping. Chest pain. Other conditions can also cause chest pain. Make sure you see your health care provider if you have chest pain. Wheezing. This is when you make high-pitched whistling sounds when you breathe, most often when you breathe out. A long-term cough or a cough at night. How is this diagnosed? GERD may be diagnosed based on your medical history and a physical exam. You may also have tests. These may include: An endoscopy. This test looks at your stomach and esophagus with a small camera. A barium swallow test. This shows the shape and size of your esophagus and how well it's working. Tests of your esophagus to check for: Acid levels. Pressure. How is this treated? Treatment may depend on how bad your symptoms are. It may include: Changes to your diet and daily life. Medicines. Surgery. Follow these instructions at home: Eating and drinking Follow an eating plan as told by your provider. You may need to avoid certain foods and drinks. These may  include: Coffee and tea, with or without caffeine. Alcohol. Energy drinks and sports drinks. Fizzy drinks or sodas. Chocolate and cocoa. Peppermint and mint flavorings. Garlic and onions. Horseradish. Spicy and acidic foods. These include: Peppers. Chili powder and curry powder. Vinegar. Hot sauces and BBQ sauce. Citrus fruits and  juices. These include: Oranges. Lemons. Limes. Tomato-based foods. These include: Red sauce and pizza with red sauce. Chili. Salsa. Fried and fatty foods. These include: Donuts. French fries. Potato chips. High-fat dressings. High-fat meats. These include: Hot dogs and sausage. Rib eye steak. Ham and bacon. High-fat dairy items. These include: Whole milk. Butter. Cream cheese. Eat small meals often. Avoid eating big meals. Avoid drinking lots of liquid with your meals. Try not to eat meals during the 2-3 hours before bedtime. Try not to lie down right after you eat. Do not exercise right after you eat. Lifestyle  If you're overweight, lose an amount of weight that's healthy for you. Ask your provider about a safe weight loss goal. Do not smoke, vape, or use nicotine or tobacco. Wear loose clothes. Do not wear things that are tight around your waist. When you sleep, try: Raising the head of your bed about 6 inches (15 cm). You can use a wedge to do this. Lying down on your left side. Try to lower your stress. If you need help doing this, ask your provider. General instructions Take your medicines only as told. Do not take aspirin or ibuprofen unless you're told to. Watch for any changes in your symptoms. Do not bend over if it makes your symptoms worse. Contact a health care provider if: You have new symptoms. You have trouble: Drinking. Swallowing. Eating. It hurts to swallow. You have wheezing. You have a cough that won't go away. Your voice is hoarse. Your symptoms don't get better with treatment. Get help right away  if: You have pain all of a sudden in your: Arm. Neck. Jaw. Teeth. Back. You feel sweaty, dizzy, or light-headed all of a sudden. You faint. You have chest pain or shortness of breath. You vomit and the vomit is: Green, yellow, or black. Looks like blood or coffee grounds. Your poop is red, bloody, or black. These symptoms may be an emergency. Call 911 right away. Do not wait to see if the symptoms will go away. Do not drive yourself to the hospital. This information is not intended to replace advice given to you by your health care provider. Make sure you discuss any questions you have with your health care provider. Document Revised: 02/01/2023 Document Reviewed: 08/18/2022 Elsevier Patient Education  2024 Elsevier Inc.  GERD in Adults: Diet Changes When you have gastroesophageal reflux disease (GERD), you may need to make changes to your diet. Choosing the right foods can help with your symptoms. Think about working with an expert in healthy eating called a dietitian. They can help you make healthy food choices. What are tips for following this plan? Reading food labels Look for foods that are low in saturated fat. Foods that may help with your symptoms include: Foods with less than 5% of daily value (DV) of fat. Foods with 0 grams of trans fat. Cooking [employer] in ways that don't use a lot of fat. These ways include: Baking. Steaming. Grilling. Broiling. To add flavor, try to use herbs that are low in spice and acidity. Avoid frying your food. Meal planning  Eat small meals often rather than eating 3 large meals each day. Eat your meals slowly in a place where you feel relaxed. If told by your health care provider, avoid: Foods that cause symptoms. Keep a food diary to keep track of foods that cause symptoms. Alcohol. Drinking a lot of liquid with meals. General instructions For 2-3 hours after you eat, avoid: Bending over.  Exercise. Lying down. Chew  sugar-free gum after meals. What foods should I eat? Eat a healthy diet. Try to include: Foods with high amounts of fiber. These include: Fruits and vegetables. Whole grains and beans. Low-fat dairy products. Lean meats, fish, and poultry. Egg whites. Foods that cause symptoms in someone else may not cause symptoms for you. Work with your provider to find foods that are safe for you. The items listed above may not be all the foods and drinks you can have. Talk with a dietitian to learn more. The items listed above may not be a complete list of foods and beverages you can eat and drink. Contact a dietitian for more information. What foods should I avoid? Limiting some of these foods may help with your symptoms. Each person is different. Talk with a dietitian or your provider to help you find the exact foods to avoid. Some of the foods to avoid may include: Fruits Fruits with a lot of acid in them. These may include citrus fruits, such as oranges, grapefruit, pineapple, and lemons. Vegetables Deep-fried vegetables, such as French fries. Vegetables, sauces, or toppings made with added fat and vegetables with acid in them. These may include tomatoes and tomato products, chili peppers, onions, garlic, and horseradish. Grains Pastries or quick breads with added fat. Meats and other proteins High-fat meats, such as fatty beef or pork, hot dogs, ribs, ham, sausage, salami, and bacon. Fried meat or protein, such as fried fish and fried chicken. Egg yolks. Fats and oils Butter. Margarine. Shortening. Ghee. Drinks Coffee and other drinks with caffeine in them. Fizzy and sugary drinks, such as soda and energy drinks. Fruit juice made with acidic fruits, such as orange or grapefruit. Tomato juice. Sweets and desserts Chocolate and cocoa. Donuts. Seasonings and condiments Mint, such as peppermint and spearmint. Condiments, herbs, or seasonings that cause symptoms. These may include curry, hot sauce,  or vinegar-based salad dressings. The items listed above may not be all the foods and drinks you should avoid. Talk with a dietitian to learn more. Questions to ask your health care provider Changes to your diet and everyday life are often the first steps taken to manage symptoms of GERD. If these changes don't help, talk with your provider about taking medicines. Where to find more information International [employer] for Gastrointestinal Disorders: aboutgerd.org This information is not intended to replace advice given to you by your health care provider. Make sure you discuss any questions you have with your health care provider. Document Revised: 02/01/2023 Document Reviewed: 08/18/2022 Elsevier Patient Education  2024 [employer].

## 2023-05-11 NOTE — Progress Notes (Signed)
 Pt's states no medical or surgical changes since previsit or office visit.

## 2023-05-12 ENCOUNTER — Telehealth: Payer: Self-pay | Admitting: *Deleted

## 2023-05-12 NOTE — Telephone Encounter (Signed)
  Follow up Call-     05/11/2023    9:39 AM  Call back number  Post procedure Call Back phone  # [phone]  Permission to leave phone message Yes     Patient questions:  Do you have a fever, pain , or abdominal swelling? No. Pain Score  0 *  Have you tolerated food without any problems? Yes.    Have you been able to return to your normal activities? Yes.    Do you have any questions about your discharge instructions: Diet   No. Medications  No. Follow up visit  No.  Do you have questions or concerns about your Care? No.  Actions: * If pain score is 4 or above: No action needed, pain <4.

## 2023-05-13 DIAGNOSIS — M6281 Muscle weakness (generalized): Secondary | ICD-10-CM | POA: Diagnosis not present

## 2023-05-13 DIAGNOSIS — M25511 Pain in right shoulder: Secondary | ICD-10-CM | POA: Diagnosis not present

## 2023-05-13 DIAGNOSIS — M799 Soft tissue disorder, unspecified: Secondary | ICD-10-CM | POA: Diagnosis not present

## 2023-05-13 DIAGNOSIS — M25611 Stiffness of right shoulder, not elsewhere classified: Secondary | ICD-10-CM | POA: Diagnosis not present

## 2023-05-16 DIAGNOSIS — D0439 Carcinoma in situ of skin of other parts of face: Secondary | ICD-10-CM | POA: Diagnosis not present

## 2023-05-16 DIAGNOSIS — C44329 Squamous cell carcinoma of skin of other parts of face: Secondary | ICD-10-CM | POA: Diagnosis not present

## 2023-05-18 ENCOUNTER — Ambulatory Visit: Payer: Medicare Other | Admitting: Orthopaedic Surgery

## 2023-05-23 DIAGNOSIS — M799 Soft tissue disorder, unspecified: Secondary | ICD-10-CM | POA: Diagnosis not present

## 2023-05-23 DIAGNOSIS — M25511 Pain in right shoulder: Secondary | ICD-10-CM | POA: Diagnosis not present

## 2023-05-23 DIAGNOSIS — M6281 Muscle weakness (generalized): Secondary | ICD-10-CM | POA: Diagnosis not present

## 2023-05-23 DIAGNOSIS — M25611 Stiffness of right shoulder, not elsewhere classified: Secondary | ICD-10-CM | POA: Diagnosis not present

## 2023-05-27 ENCOUNTER — Encounter: Payer: Self-pay | Admitting: Orthopedic Surgery

## 2023-05-27 ENCOUNTER — Other Ambulatory Visit: Payer: Self-pay | Admitting: Internal Medicine

## 2023-05-27 ENCOUNTER — Ambulatory Visit: Payer: Medicare Other | Admitting: Orthopedic Surgery

## 2023-05-27 DIAGNOSIS — M6281 Muscle weakness (generalized): Secondary | ICD-10-CM | POA: Diagnosis not present

## 2023-05-27 DIAGNOSIS — M799 Soft tissue disorder, unspecified: Secondary | ICD-10-CM | POA: Diagnosis not present

## 2023-05-27 DIAGNOSIS — G8929 Other chronic pain: Secondary | ICD-10-CM | POA: Diagnosis not present

## 2023-05-27 DIAGNOSIS — M25611 Stiffness of right shoulder, not elsewhere classified: Secondary | ICD-10-CM | POA: Diagnosis not present

## 2023-05-27 DIAGNOSIS — M25511 Pain in right shoulder: Secondary | ICD-10-CM

## 2023-05-27 NOTE — Progress Notes (Signed)
Office Visit Note   Patient: Rodney Gray           Date of Birth: September 15, 1950           MRN: [id number] Visit Date: 05/27/2023 Requested by: Karie Schwalbe, MD [address] [place],  [place] [address] PCP: Karie Schwalbe, MD  Subjective: Chief Complaint  Patient presents with   Right Shoulder - Follow-up    HPI: Rodney Gray is a 73 y.o. male who presents to the office reporting continued superior right shoulder pain.  Overall he is doing some better after doing physical therapy for 3 months.  He has plateaued in physical therapy.  MRI scan showed nothing definitive in terms of operative pathology in the region of question..                ROS: All systems reviewed are negative as they relate to the chief complaint within the history of present illness.  Patient denies fevers or chills.  Assessment & Plan: Visit Diagnoses:  1. Chronic right shoulder pain     Plan: Impression is focal area of scarred and muscular tissue which may be restricting Kamas range of motion to some degree in terms of seamless range of motion.  For now he is going to continue with his current level of activity.  He will follow-up in 6 6 months with repeat radiographs with a BB on that area and that superior trapezial region.  Follow-Up Instructions: No follow-ups on file.   Orders:  No orders of the defined types were placed in this encounter.  No orders of the defined types were placed in this encounter.     Procedures: No procedures performed   Clinical Data: No additional findings.  Objective: Vital Signs: There were no vitals taken for this visit.  Physical Exam:  Constitutional: Patient appears well-developed HEENT:  Head: Normocephalic Eyes:EOM are normal Neck: Normal range of motion Cardiovascular: Normal rate Pulmonary/chest: Effort normal Neurologic: Patient is alert Skin: Skin is warm Psychiatric: Patient has normal mood and affect  Ortho Exam:  Ortho exam demonstrates excellent range of motion of the right shoulder with no restriction of external rotation.  No discrete AC joint tenderness is present.  No coarse grinding or crepitus in the right shoulder with internal/external rotation at 90 degrees of abduction.  No other masses lymphadenopathy or skin changes noted in the shoulder region.  Slight prominence in the trapezial region around the middle of the clavicle posteriorly.  Not particular tender to palpation.  Specialty Comments:  No specialty comments available.  Imaging: No results found.   PMFS History: Patient Active Problem List   Diagnosis Date Noted   Acute non-recurrent maxillary sinusitis 02/11/2023   Right shoulder pain 12/23/2022   Spinal stenosis in cervical region 12/13/2022   Gastroesophageal reflux disease 10/27/2022   Neck pain 08/05/2022   Preventative health care 10/21/2021   Hypertension 09/21/2021   Hemorrhoid prolapse 12/25/2019   Polymyalgia rheumatica (HCC) 08/01/2017   Advance directive discussed with patient 09/12/2015   BPH with obstruction/lower urinary tract symptoms    Allergic rhinitis due to pollen    Atrial fibrillation (HCC) 05/12/2009   Osteoarthritis 09/13/2006   Past Medical History:  Diagnosis Date   Allergic rhinitis due to pollen    Atrial fibrillation (HCC)    A-Fib, 1987, 2003, 2005, EPS RX 2005 (A-Fib reocurred)   Atrial fibrillation (HCC)    Basal cell carcinoma    skin CA- squamous and  Basal cell   BPH (benign prostatic hypertrophy)    Cataract    Fibromyalgia    History of kidney stones    kidney stones 2 years ago   Hyperlipemia    no per pt   Hypertension    no per pt   Kidney stone    Osteoarthritis    Polymyalgia rheumatica (HCC)    Squamous cell carcinoma in situ     Family History  Problem Relation Age of Onset   Hypertension Father    Cirrhosis Father 79   Alcohol abuse Father    Gallstones Mother    Hyperlipidemia Mother    Esophageal cancer  Brother    Asthma Daughter    Coronary artery disease Neg Hx    Diabetes Neg Hx    Cancer Neg Hx        prostate or colon   Colon cancer Neg Hx    Rectal cancer Neg Hx    Stomach cancer Neg Hx     Past Surgical History:  Procedure Laterality Date   ANTERIOR CERVICAL DECOMP/DISCECTOMY FUSION N/A 12/13/2022   Procedure: Anterior Cervical Decompression Fusion  Cervical five-Cervical six - Cervical six-Cervical seven;  Surgeon: Donalee Citrin, MD;  Location: [facility] OR;  Service: Neurosurgery;  Laterality: N/A;   CAPSULOTOMY Bilateral 08/2007   CATARACT EXTRACTION Bilateral    both eyes   COLONOSCOPY     MENISCUS REPAIR Left 09/2009   left knee---Dr [facility]   NASAL SINUS SURGERY  May 2014   RETINAL TEAR REPAIR CRYOTHERAPY Right    right 12/06   RETINAL TEAR REPAIR CRYOTHERAPY Right    Retinal tear/bleed- Right 12/06   SQUAMOUS CELL CARCINOMA EXCISION Left 07/2008   left forearm   THUMB ARTHROSCOPY Left 1999   Social History   Occupational History   Occupation: retired [employer]: RETIRED  Tobacco Use   Smoking status: Never    Passive exposure: Never   Smokeless tobacco: Never  Vaping Use   Vaping status: Never Used  Substance and Sexual Activity   Alcohol use: Yes    Comment: occ. 3 per month   Drug use: Never   Sexual activity: Yes

## 2023-05-27 NOTE — Telephone Encounter (Signed)
Spoke to pt. He verified he is taking a 5mg  every other day along with a 1mg . Sent in new rx for the 5mg  as he said he just filled the 1mg .

## 2023-05-31 DIAGNOSIS — M9903 Segmental and somatic dysfunction of lumbar region: Secondary | ICD-10-CM | POA: Diagnosis not present

## 2023-05-31 DIAGNOSIS — M5136 Other intervertebral disc degeneration, lumbar region with discogenic back pain only: Secondary | ICD-10-CM | POA: Diagnosis not present

## 2023-05-31 DIAGNOSIS — M9902 Segmental and somatic dysfunction of thoracic region: Secondary | ICD-10-CM | POA: Diagnosis not present

## 2023-05-31 DIAGNOSIS — M62830 Muscle spasm of back: Secondary | ICD-10-CM | POA: Diagnosis not present

## 2023-06-20 DIAGNOSIS — D0439 Carcinoma in situ of skin of other parts of face: Secondary | ICD-10-CM | POA: Diagnosis not present

## 2023-06-27 ENCOUNTER — Encounter: Payer: Self-pay | Admitting: Internal Medicine

## 2023-06-27 ENCOUNTER — Ambulatory Visit (INDEPENDENT_AMBULATORY_CARE_PROVIDER_SITE_OTHER): Admitting: Internal Medicine

## 2023-06-27 VITALS — BP 120/84 | HR 72 | Temp 98.0°F | Ht 68.0 in | Wt 182.0 lb

## 2023-06-27 DIAGNOSIS — R42 Dizziness and giddiness: Secondary | ICD-10-CM | POA: Diagnosis not present

## 2023-06-27 NOTE — Assessment & Plan Note (Signed)
 Consistent with BPPV Improved now May be related to the extra salt intake from soy sauce He felt the emetrol helped--so will hold off on meclizine

## 2023-06-27 NOTE — Progress Notes (Signed)
 Subjective:    Patient ID: Rodney Gray, male    DOB: 13-Feb-1951, 73 y.o.   MRN: [id number]  HPI Here due to vertigo and balance problems With wife  Doing well --active 2 days ago Woke in the middle of the night---was okay and went back to sleep after reading Then woke in AM at 6:30 and the room started was spinning after turning over Leaning to the right side while walking---no falls (would hold onto something) Some nausea--but emetrol helped No more spinning---but still weird feeling when putting his head back to put in eye drops Fine in shower this morning  Had cold last week--took zinc Was at [employer]---returned 3/12 though (no symptoms then)  No tinnitus No change in hearing Did try canalith repositioning  Current Outpatient Medications on File Prior to Visit  Medication Sig Dispense Refill   acetaminophen (TYLENOL) 650 MG CR tablet Take 650 mg by mouth at bedtime as needed for pain.     apixaban (ELIQUIS) 5 MG TABS tablet TAKE 1 TABLET BY MOUTH TWICE  DAILY 180 tablet 1   ARTIFICIAL TEAR SOLUTION OP Place 1 drop into both eyes 3 (three) times daily.     clindamycin (CLEOCIN) 150 MG capsule Take 4 capsules by mouth as needed. Prior to dental appointments     diltiazem (CARDIZEM CD) 180 MG 24 hr capsule TAKE 1 CAPSULE BY MOUTH EVERY DAY 90 capsule 2   methocarbamol (ROBAXIN) 500 MG tablet Take 1 tablet (500 mg total) by mouth at bedtime. 30 tablet 1   montelukast (SINGULAIR) 10 MG tablet Take 1 tablet (10 mg total) by mouth as needed. Allergies 90 tablet 1   Multiple Vitamins-Minerals (MENS MULTIVITAMIN PLUS PO) Take 1 capsule by mouth daily.     Omega-3 Fatty Acids (FISH OIL) 1200 MG CAPS Take 1 capsule by mouth daily.     omeprazole (PRILOSEC) 20 MG capsule TAKE 1 CAPSULE BY MOUTH AT BEDTIME. 90 capsule 3   omeprazole (PRILOSEC) 40 MG capsule Take 1 capsule (40 mg total) by mouth daily. Take 1 at evening meal 30 capsule 3   predniSONE (DELTASONE) 1 MG tablet Take 1  tablet (1 mg total) by mouth every other day. Along with a 5 mg tab 45 tablet 3   predniSONE (DELTASONE) 5 MG tablet Take 1 tablet (5 mg total) by mouth every other day. Wean as directed 45 tablet 3   tadalafil (CIALIS) 10 MG tablet Take 1 tablet (10 mg total) by mouth daily. OK to take additional 10mg  boost dose as needed 45 minutes prior to sexual activity 90 tablet 4   No current facility-administered medications on file prior to visit.    Allergies  Allergen Reactions   Ampicillin Hives    Has tolerated cephalosporins    Past Medical History:  Diagnosis Date   Allergic rhinitis due to pollen    Atrial fibrillation (HCC)    A-Fib, 1987, 2003, 2005, EPS RX 2005 (A-Fib reocurred)   Atrial fibrillation (HCC)    Basal cell carcinoma    skin CA- squamous and Basal cell   BPH (benign prostatic hypertrophy)    Cataract    Fibromyalgia    History of kidney stones    kidney stones 2 years ago   Hyperlipemia    no per pt   Hypertension    no per pt   Kidney stone    Osteoarthritis    Polymyalgia rheumatica (HCC)    Squamous cell carcinoma in situ  Past Surgical History:  Procedure Laterality Date   ANTERIOR CERVICAL DECOMP/DISCECTOMY FUSION N/A 12/13/2022   Procedure: Anterior Cervical Decompression Fusion  Cervical five-Cervical six - Cervical six-Cervical seven;  Surgeon: Donalee Citrin, MD;  Location: [facility] OR;  Service: Neurosurgery;  Laterality: N/A;   CAPSULOTOMY Bilateral 08/2007   CATARACT EXTRACTION Bilateral    both eyes   COLONOSCOPY     MENISCUS REPAIR Left 09/2009   left knee---Dr [facility]   NASAL SINUS SURGERY  May 2014   RETINAL TEAR REPAIR CRYOTHERAPY Right    right 12/06   RETINAL TEAR REPAIR CRYOTHERAPY Right    Retinal tear/bleed- Right 12/06   SQUAMOUS CELL CARCINOMA EXCISION Left 07/2008   left forearm   THUMB ARTHROSCOPY Left 1999    Family History  Problem Relation Age of Onset   Hypertension Father    Cirrhosis Father 64   Alcohol abuse Father     Gallstones Mother    Hyperlipidemia Mother    Esophageal cancer Brother    Asthma Daughter    Coronary artery disease Neg Hx    Diabetes Neg Hx    Cancer Neg Hx        prostate or colon   Colon cancer Neg Hx    Rectal cancer Neg Hx    Stomach cancer Neg Hx     Social History   Socioeconomic History   Marital status: Married    Spouse name: Not on file   Number of children: 3   Years of education: Not on file   Highest education level: Not on file  Occupational History   Occupation: retired Mudlogger: RETIRED  Tobacco Use   Smoking status: Never    Passive exposure: Never   Smokeless tobacco: Never  Vaping Use   Vaping status: Never Used  Substance and Sexual Activity   Alcohol use: Yes    Comment: occ. 3 per month   Drug use: Never   Sexual activity: Yes  Other Topics Concern   Not on file  Social History Narrative   Has living will   Wife is health care POA--alternate is son Arlys John   Would accept resuscitation attempts   Would accept tube feeds for some time--depending on prognosis   Social Drivers of Health   Financial Resource Strain: Not on file  Food Insecurity: No Food Insecurity (12/15/2022)   Hunger Vital Sign    Worried About Running Out of Food in the Last Year: Never true    Ran Out of Food in the Last Year: Never true  Transportation Needs: No Transportation Needs (12/15/2022)   PRAPARE - Administrator, Civil Service (Medical): No    Lack of Transportation (Non-Medical): No  Physical Activity: Not on file  Stress: Not on file  Social Connections: Not on file  Intimate Partner Violence: Not on file   Review of Systems Taking magnesium for calf pain--?sciatica No fever Slight dull headache---sort of Feeling very thirsty Did have Asian stir fry with extra soy sauce the night before this happened     Objective:   Physical Exam Constitutional:      Appearance: Normal appearance.  HENT:     Right  Ear: Tympanic membrane and ear canal normal.     Left Ear: Tympanic membrane and ear canal normal.     Mouth/Throat:     Pharynx: No oropharyngeal exudate or posterior oropharyngeal erythema.  Eyes:     Comments: ? Slight horizontal nystagmus  with right gaze  Cardiovascular:     Rate and Rhythm: Normal rate. Rhythm irregular.  Pulmonary:     Effort: Pulmonary effort is normal.     Breath sounds: Normal breath sounds. No wheezing or rales.  Musculoskeletal:     Cervical back: Neck supple.  Lymphadenopathy:     Cervical: No cervical adenopathy.  Neurological:     Mental Status: He is alert and oriented to person, place, and time.     Cranial Nerves: Cranial nerves 2-12 are intact.     Motor: No weakness.     Gait: Gait normal.            Assessment & Plan:

## 2023-06-28 ENCOUNTER — Other Ambulatory Visit: Payer: Self-pay

## 2023-06-28 ENCOUNTER — Emergency Department
Admission: EM | Admit: 2023-06-28 | Discharge: 2023-06-29 | Disposition: A | Discharge status: Home / Self Care | Attending: Emergency Medicine | Admitting: Emergency Medicine

## 2023-06-28 ENCOUNTER — Emergency Department

## 2023-06-28 DIAGNOSIS — M62830 Muscle spasm of back: Secondary | ICD-10-CM | POA: Diagnosis not present

## 2023-06-28 DIAGNOSIS — R2231 Localized swelling, mass and lump, right upper limb: Secondary | ICD-10-CM | POA: Diagnosis present

## 2023-06-28 DIAGNOSIS — M1811 Unilateral primary osteoarthritis of first carpometacarpal joint, right hand: Secondary | ICD-10-CM | POA: Diagnosis not present

## 2023-06-28 DIAGNOSIS — M67431 Ganglion, right wrist: Secondary | ICD-10-CM | POA: Diagnosis not present

## 2023-06-28 DIAGNOSIS — M9902 Segmental and somatic dysfunction of thoracic region: Secondary | ICD-10-CM | POA: Diagnosis not present

## 2023-06-28 DIAGNOSIS — M25531 Pain in right wrist: Secondary | ICD-10-CM | POA: Diagnosis not present

## 2023-06-28 DIAGNOSIS — Z7901 Long term (current) use of anticoagulants: Secondary | ICD-10-CM | POA: Insufficient documentation

## 2023-06-28 DIAGNOSIS — M5136 Other intervertebral disc degeneration, lumbar region with discogenic back pain only: Secondary | ICD-10-CM | POA: Diagnosis not present

## 2023-06-28 DIAGNOSIS — M9903 Segmental and somatic dysfunction of lumbar region: Secondary | ICD-10-CM | POA: Diagnosis not present

## 2023-06-28 DIAGNOSIS — M7989 Other specified soft tissue disorders: Secondary | ICD-10-CM | POA: Diagnosis not present

## 2023-06-28 LAB — BASIC METABOLIC PANEL
Anion gap: 6 (ref 5–15)
BUN: 20 mg/dL (ref 8–23)
CO2: 26 mmol/L (ref 22–32)
Calcium: 10 mg/dL (ref 8.9–10.3)
Chloride: 106 mmol/L (ref 98–111)
Creatinine, Ser: 0.91 mg/dL (ref 0.61–1.24)
GFR, Estimated: >60 mL/min (ref >60)
Glucose, Bld: 108 mg/dL — ABNORMAL HIGH (ref 70–99)
Potassium: 4.4 mmol/L (ref 3.5–5.1)
Sodium: 138 mmol/L (ref 135–145)

## 2023-06-28 LAB — CBC
HCT: 41.9 % (ref 39.0–52.0)
Hemoglobin: 13.9 g/dL (ref 13.0–17.0)
MCH: 31.3 pg (ref 26.0–34.0)
MCHC: 33.2 g/dL (ref 30.0–36.0)
MCV: 94.4 fL (ref 80.0–100.0)
Platelets: 245 10*3/uL (ref 150–400)
RBC: 4.44 MIL/uL (ref 4.22–5.81)
RDW: 13.1 % (ref 11.5–15.5)
WBC: 8.8 10*3/uL (ref 4.0–10.5)
nRBC: 0 % (ref 0.0–0.2)

## 2023-06-28 LAB — URIC ACID: Uric Acid, Serum: 5.2 mg/dL (ref 3.7–8.6)

## 2023-06-28 MED ORDER — CEPHALEXIN 500 MG PO CAPS
500.0000 mg | ORAL_CAPSULE | Freq: Once | ORAL | Status: AC
  Administered 2023-06-28: 500 mg via ORAL
  Filled 2023-06-28: qty 1

## 2023-06-28 MED ORDER — CEPHALEXIN 500 MG PO CAPS: 500.0000 mg | ORAL_CAPSULE | Freq: Four times a day (QID) | ORAL | 0 refills | Status: AC

## 2023-06-28 NOTE — ED Provider Notes (Signed)
 [place] EMERGENCY DEPARTMENT AT [facility] Provider Note   CSN: [id number] Arrival date & time: 06/28/23  1947     History  Chief Complaint  Patient presents with   R Wrist Swelling    Rodney Gray is a 73 y.o. male presents to the emergency department for evaluation of right wrist swelling.  Patient states he was playing basketball with his grandson earlier today, developed some swelling along the right wrist along with some warmth/pinkish-reddish discoloration along the dorsum of the wrist.  He denies any history of gout.  No trauma or injury.  No numbness or tingling.  Has not tried any medications or bracing since noticing the discomfort.  HPI     Home Medications Prior to Admission medications   Medication Sig Start Date End Date Taking? Authorizing Provider  cephALEXin (KEFLEX) 500 MG capsule Take 1 capsule (500 mg total) by mouth 4 (four) times daily for 7 days. 06/28/23 07/05/23 Yes Evon Slack, PA-C  acetaminophen (TYLENOL) 650 MG CR tablet Take 650 mg by mouth at bedtime as needed for pain.    [provider]  apixaban (ELIQUIS) 5 MG TABS tablet TAKE 1 TABLET BY MOUTH TWICE  DAILY 02/17/23   Marinus Maw, MD  ARTIFICIAL TEAR SOLUTION OP Place 1 drop into both eyes 3 (three) times daily.    [provider]  clindamycin (CLEOCIN) 150 MG capsule Take 4 capsules by mouth as needed. Prior to dental appointments 04/10/19   [provider]  diltiazem (CARDIZEM CD) 180 MG 24 hr capsule TAKE 1 CAPSULE BY MOUTH EVERY DAY 01/10/23   Marinus Maw, MD  methocarbamol (ROBAXIN) 500 MG tablet Take 1 tablet (500 mg total) by mouth at bedtime. 12/13/22   Meyran, Tiana Loft, NP  montelukast (SINGULAIR) 10 MG tablet Take 1 tablet (10 mg total) by mouth as needed. Allergies 02/11/23   Karie Schwalbe, MD  Multiple Vitamins-Minerals (MENS MULTIVITAMIN PLUS PO) Take 1 capsule by mouth daily.    [provider]  Omega-3 Fatty Acids  (FISH OIL) 1200 MG CAPS Take 1 capsule by mouth daily.    [provider]  omeprazole (PRILOSEC) 20 MG capsule TAKE 1 CAPSULE BY MOUTH AT BEDTIME. 09/16/22   Karie Schwalbe, MD  omeprazole (PRILOSEC) 40 MG capsule Take 1 capsule (40 mg total) by mouth daily. Take 1 at evening meal 05/11/23   Sherrilyn Rist, MD  predniSONE (DELTASONE) 1 MG tablet Take 1 tablet (1 mg total) by mouth every other day. Along with a 5 mg tab 11/18/22   Karie Schwalbe, MD  predniSONE (DELTASONE) 5 MG tablet Take 1 tablet (5 mg total) by mouth every other day. Wean as directed 05/27/23   Karie Schwalbe, MD  tadalafil (CIALIS) 10 MG tablet Take 1 tablet (10 mg total) by mouth daily. OK to take additional 10mg  boost dose as needed 45 minutes prior to sexual activity 12/09/22   Sondra Come, MD      Allergies    Ampicillin    Review of Systems   Review of Systems  Physical Exam Updated Vital Signs BP (!) 158/84 (BP Location: Left Arm)   Pulse 76   Temp 98.3 F (36.8 C) (Oral)   Resp 19   Ht 5\' 8"  (1.727 m)   Wt 82.6 kg   SpO2 97%   BMI 27.70 kg/m  Physical Exam Constitutional:      Appearance: He is well-developed.  HENT:  Head: Normocephalic and atraumatic.  Eyes:     Conjunctiva/sclera: Conjunctivae normal.  Cardiovascular:     Rate and Rhythm: Normal rate.  Pulmonary:     Effort: Pulmonary effort is normal. No respiratory distress.  Musculoskeletal:        General: Normal range of motion.     Cervical back: Normal range of motion.     Comments: Normal range of motion of the right wrist and digits.  No significant pain with wrist range of motion or digit range of motion.  There is a focal area of swelling/fluctuance on the dorsum of the wrist along the radiocarpal joint consistent with a ganglion cyst.  This well-contained area of fluid is slightly fluctuant and mobile.  There is some mild surrounding erythema and slight warmth just above the ganglion cyst.  2+ radial pulse.   Normal sensation throughout.  No signs of septic joint or compartment syndrome.  Skin:    General: Skin is warm.     Capillary Refill: Capillary refill takes less than 2 seconds.     Findings: No rash.  Neurological:     General: No focal deficit present.     Mental Status: He is alert and oriented to person, place, and time.  Psychiatric:        Behavior: Behavior normal.        Thought Content: Thought content normal.     ED Results / Procedures / Treatments   Labs (all labs ordered are listed, but only abnormal results are displayed) Labs Reviewed  BASIC METABOLIC PANEL - Abnormal; Notable for the following components:      Result Value   Glucose, Bld 108 (*)    All other components within normal limits  CBC  URIC ACID    EKG None  Radiology DG Wrist Complete Right Result Date: 06/28/2023 CLINICAL DATA:  Pain and swelling EXAM: RIGHT WRIST - COMPLETE 3+ VIEW COMPARISON:  None Available. FINDINGS: No fracture or malalignment. Minimal first CMC joint and radiocarpal degenerative change. Prominent dorsal soft tissue swelling IMPRESSION: No acute osseous abnormality. Prominent dorsal soft tissue swelling. Electronically Signed   By: Jasmine Pang M.D.   On: 06/28/2023 22:05    Procedures Procedures    Medications Ordered in ED Medications  cephALEXin (KEFLEX) capsule 500 mg (has no administration in time range)    ED Course/ Medical Decision Making/ A&P                                 Medical Decision Making Amount and/or Complexity of Data Reviewed Labs: ordered. Radiology: ordered.  Risk Prescription drug management.   73 year old male with focal area of swelling and inflammation along the dorsum of the right wrist.  Area of swelling consistent with ganglion cyst.  There is some mild erythema and warmth present which is likely inflammation but cannot rule out cellulitis.  Will place on cephalexin.  Patient states he is tolerated cephalosporins well in the  past.  Will place into a Velcro wrist brace and have him apply ice 20 minutes every hour.  I do not believe there is an infection of the joint or that this is an abscess based on history and physical exam.  X-rays show no evidence of acute bony abnormality or arthropathy.  CBC and BMP as well as uric acid level all within normal limits.  He is [place] to rest ice and brace the wrist.  Take antibiotics  and follow-up with orthopedics.  He understands signs symptoms return to the ER for. Final Clinical Impression(s) / ED Diagnoses Final diagnoses:  Ganglion cyst of dorsum of right wrist  Acute pain of right wrist    Rx / DC Orders ED Discharge Orders          Ordered    cephALEXin (KEFLEX) 500 MG capsule  4 times daily        06/28/23 2308              Ronnette Juniper 06/28/23 2317    Claybon Jabs, MD 06/30/23 1339

## 2023-06-28 NOTE — ED Triage Notes (Signed)
 Pt arrives via POV with CC of R posterior wrist swelling that started at 1630 today. Denies injury. Swollen area is pink and warm to the touch. Denies CP and SOB. Ambulating with steady gait and in NAD.

## 2023-06-28 NOTE — Discharge Instructions (Addendum)
 Please wear Velcro wrist brace for at least 1 week.  You may remove brace to shower.  Please try to wear brace at nighttime while you are sleeping and throughout the day.  Take antibiotic as prescribed.  You may apply ice to the wrist 20 minutes every hour.  Call orthopedic office tomorrow to schedule follow-up appointment.  Return to the ER for any fevers, increasing pain swelling warmth redness numbness or tingling.

## 2023-07-04 DIAGNOSIS — D0439 Carcinoma in situ of skin of other parts of face: Secondary | ICD-10-CM | POA: Diagnosis not present

## 2023-07-07 DIAGNOSIS — Z961 Presence of intraocular lens: Secondary | ICD-10-CM | POA: Diagnosis not present

## 2023-07-07 DIAGNOSIS — H40013 Open angle with borderline findings, low risk, bilateral: Secondary | ICD-10-CM | POA: Diagnosis not present

## 2023-07-07 DIAGNOSIS — M67431 Ganglion, right wrist: Secondary | ICD-10-CM | POA: Diagnosis not present

## 2023-07-07 DIAGNOSIS — H33301 Unspecified retinal break, right eye: Secondary | ICD-10-CM | POA: Diagnosis not present

## 2023-07-07 DIAGNOSIS — H35372 Puckering of macula, left eye: Secondary | ICD-10-CM | POA: Diagnosis not present

## 2023-07-07 DIAGNOSIS — H04123 Dry eye syndrome of bilateral lacrimal glands: Secondary | ICD-10-CM | POA: Diagnosis not present

## 2023-07-08 DIAGNOSIS — H8110 Benign paroxysmal vertigo, unspecified ear: Secondary | ICD-10-CM | POA: Diagnosis not present

## 2023-07-08 DIAGNOSIS — H8112 Benign paroxysmal vertigo, left ear: Secondary | ICD-10-CM | POA: Diagnosis not present

## 2023-07-08 DIAGNOSIS — H6123 Impacted cerumen, bilateral: Secondary | ICD-10-CM | POA: Diagnosis not present

## 2023-07-13 DIAGNOSIS — M9903 Segmental and somatic dysfunction of lumbar region: Secondary | ICD-10-CM | POA: Diagnosis not present

## 2023-07-13 DIAGNOSIS — M5136 Other intervertebral disc degeneration, lumbar region with discogenic back pain only: Secondary | ICD-10-CM | POA: Diagnosis not present

## 2023-07-13 DIAGNOSIS — M9902 Segmental and somatic dysfunction of thoracic region: Secondary | ICD-10-CM | POA: Diagnosis not present

## 2023-07-13 DIAGNOSIS — M62830 Muscle spasm of back: Secondary | ICD-10-CM | POA: Diagnosis not present

## 2023-07-19 ENCOUNTER — Other Ambulatory Visit: Payer: Self-pay | Admitting: Internal Medicine

## 2023-07-19 DIAGNOSIS — M9903 Segmental and somatic dysfunction of lumbar region: Secondary | ICD-10-CM | POA: Diagnosis not present

## 2023-07-19 DIAGNOSIS — M9902 Segmental and somatic dysfunction of thoracic region: Secondary | ICD-10-CM | POA: Diagnosis not present

## 2023-07-19 DIAGNOSIS — M62830 Muscle spasm of back: Secondary | ICD-10-CM | POA: Diagnosis not present

## 2023-07-19 DIAGNOSIS — M5136 Other intervertebral disc degeneration, lumbar region with discogenic back pain only: Secondary | ICD-10-CM | POA: Diagnosis not present

## 2023-07-20 NOTE — Telephone Encounter (Signed)
 Prescription refill request for Eliquis received. Indication:afib Last office visit:6/24 Scr:0.91  3/25 Age: 73 Weight:82.6  kg  Prescription refilled

## 2023-08-02 DIAGNOSIS — M9903 Segmental and somatic dysfunction of lumbar region: Secondary | ICD-10-CM | POA: Diagnosis not present

## 2023-08-02 DIAGNOSIS — M62830 Muscle spasm of back: Secondary | ICD-10-CM | POA: Diagnosis not present

## 2023-08-02 DIAGNOSIS — M9902 Segmental and somatic dysfunction of thoracic region: Secondary | ICD-10-CM | POA: Diagnosis not present

## 2023-08-02 DIAGNOSIS — M5136 Other intervertebral disc degeneration, lumbar region with discogenic back pain only: Secondary | ICD-10-CM | POA: Diagnosis not present

## 2023-08-04 ENCOUNTER — Encounter: Payer: Self-pay | Admitting: Gastroenterology

## 2023-08-04 ENCOUNTER — Ambulatory Visit: Payer: Medicare Other | Admitting: Gastroenterology

## 2023-08-04 VITALS — BP 118/62 | HR 84 | Ht 68.0 in | Wt 185.0 lb

## 2023-08-04 DIAGNOSIS — K2100 Gastro-esophageal reflux disease with esophagitis, without bleeding: Secondary | ICD-10-CM | POA: Diagnosis not present

## 2023-08-04 NOTE — Progress Notes (Signed)
 [facility] Note  Chief Complaint: Reflux esophagitis  Subjective  Prior history  Courtland had his last colonoscopy at this office by Dr. Grandville Lax in February 2016.  Diverticulosis and no polyps found. Seen here by Dr. Dominic Friendly January 2021 after having had 10 to 14 days of a perianal "knot" that had resolved by then.  Normal perianal and DRE at that office visit. Office visit here with APP October 2021 for similar findings persisting, found to have 2 small external hemorrhoids treated conservatively. January 2025 office visit with a constellation of symptoms including reflux and heartburn, fatigue, flatulence and mucus stools, concerns about his brother's history of esophageal cancer.  Previous intermittent symptoms but got worse after an acute probable infectious illness May 2024, and with the systemic symptoms patient was questioning long COVID. EGD February 2025 with grade 2 reflux esophagitis, Hill grade 3 EG junction.  Omeprazole  20 mg with evening meal increased to 40 mg, GERD related diet lifestyle measures reinforced.    Discussed the use of AI scribe software for clinical note transcription with the patient, who gave verbal consent to proceed.  History of Present Illness TYRELLE GUDAITIS is a 73 year old male who presents with follow-up regarding reflux management and sciatica symptoms.  He is experiencing exacerbation of sciatica symptoms, which he believes may be affecting his bowel habits. Previously regular morning bowel movements have changed to episodes of gas and clear fluid, followed by small bowel movements throughout the day, often triggered by urination.  He has been managing chronic stomach reflux with omeprazole , initially at 20 mg daily, increased to 40 mg following endoscopic findings of reflux irritation and esophagitis. Since the dose increase, he has experienced significant improvement with no regurgitation or acid reflux and has not required  additional antacids. He takes omeprazole  around 8 PM, approximately an hour to an hour and a half after dinner.  He avoids eating near bedtime and limits spicy foods. He has switched from milk chocolate to dark chocolate to better manage reflux symptoms.    ROS: Cardiovascular:  no chest pain Respiratory: no dyspnea  The patient's Past Medical, Family and Social History were reviewed and are on file in the EMR. Past Medical History:  Diagnosis Date   Allergic rhinitis due to pollen    Atrial fibrillation (HCC)    A-Fib, 1987, 2003, 2005, EPS RX 2005 (A-Fib reocurred)   Atrial fibrillation (HCC)    Basal cell carcinoma    skin CA- squamous and Basal cell   BPH (benign prostatic hypertrophy)    Cataract    Fibromyalgia    History of kidney stones    kidney stones 2 years ago   Hyperlipemia    no per pt   Hypertension    no per pt   Kidney stone    Osteoarthritis    Polymyalgia rheumatica (HCC)    Squamous cell carcinoma in situ     Past Surgical History:  Procedure Laterality Date   ANTERIOR CERVICAL DECOMP/DISCECTOMY FUSION N/A 12/13/2022   Procedure: Anterior Cervical Decompression Fusion  Cervical five-Cervical six - Cervical six-Cervical seven;  Surgeon: Gearl Keens, MD;  Location: [facility] OR;  Service: Neurosurgery;  Laterality: N/A;   CAPSULOTOMY Bilateral 08/2007   CATARACT EXTRACTION Bilateral    both eyes   COLONOSCOPY     MENISCUS REPAIR Left 09/2009   left knee---Dr [facility]   NASAL SINUS SURGERY  May 2014   RETINAL TEAR REPAIR CRYOTHERAPY Right    right  12/06   RETINAL TEAR REPAIR CRYOTHERAPY Right    Retinal tear/bleed- Right 12/06   SQUAMOUS CELL CARCINOMA EXCISION Left 07/2008   left forearm   THUMB ARTHROSCOPY Left 1999     Objective:  Med list reviewed  Current Outpatient Medications:    acetaminophen  (TYLENOL ) 650 MG CR tablet, Take 650 mg by mouth at bedtime as needed for pain., Disp: , Rfl:    ELIQUIS  5 MG TABS tablet, TAKE 1 TABLET BY MOUTH  TWICE  DAILY, Disp: 180 tablet, Rfl: 3   Multiple Vitamins-Minerals (MENS MULTIVITAMIN PLUS PO), Take 1 capsule by mouth daily., Disp: , Rfl:    Omega-3 Fatty Acids (FISH OIL) 1200 MG CAPS, Take 1 capsule by mouth daily., Disp: , Rfl:    omeprazole  (PRILOSEC) 40 MG capsule, Take 1 capsule (40 mg total) by mouth daily. Take 1 at evening meal, Disp: 30 capsule, Rfl: 3   ARTIFICIAL TEAR SOLUTION OP, Place 1 drop into both eyes 3 (three) times daily., Disp: , Rfl:    clindamycin  (CLEOCIN ) 150 MG capsule, Take 4 capsules by mouth as needed. Prior to dental appointments (Patient not taking: Reported on 08/04/2023), Disp: , Rfl:    diltiazem  (CARDIZEM  CD) 180 MG 24 hr capsule, TAKE 1 CAPSULE BY MOUTH EVERY DAY (Patient not taking: Reported on 08/04/2023), Disp: 90 capsule, Rfl: 2   methocarbamol  (ROBAXIN ) 500 MG tablet, Take 1 tablet (500 mg total) by mouth at bedtime. (Patient not taking: Reported on 08/04/2023), Disp: 30 tablet, Rfl: 1   montelukast  (SINGULAIR ) 10 MG tablet, Take 1 tablet (10 mg total) by mouth as needed. Allergies (Patient not taking: Reported on 08/04/2023), Disp: 90 tablet, Rfl: 1   omeprazole  (PRILOSEC) 20 MG capsule, TAKE 1 CAPSULE BY MOUTH AT BEDTIME. (Patient not taking: Reported on 08/04/2023), Disp: 90 capsule, Rfl: 3   predniSONE  (DELTASONE ) 1 MG tablet, Take 1 tablet (1 mg total) by mouth every other day. Along with a 5 mg tab (Patient not taking: Reported on 08/04/2023), Disp: 45 tablet, Rfl: 3   predniSONE  (DELTASONE ) 5 MG tablet, Take 1 tablet (5 mg total) by mouth every other day. Wean as directed (Patient not taking: Reported on 08/04/2023), Disp: 45 tablet, Rfl: 3   tadalafil  (CIALIS ) 10 MG tablet, Take 1 tablet (10 mg total) by mouth daily. OK to take additional 10mg  boost dose as needed 45 minutes prior to sexual activity (Patient not taking: Reported on 08/04/2023), Disp: 90 tablet, Rfl: 4   Vital signs in last 24 hrs: Vitals:   08/04/23 0823  BP: 118/62  Pulse: 84   Wt Readings  from Last 3 Encounters:  08/04/23 185 lb (83.9 kg)  06/28/23 182 lb 3.2 oz (82.6 kg)  06/27/23 182 lb (82.6 kg)    Physical Exam  Wife present for entire visit Looks well-no additional exam.  Entire visit spent in review of records, symptoms and plan   Labs:   ___________________________________________ Radiologic studies:   ____________________________________________ Other:   _____________________________________________   Encounter Diagnosis  Name Primary?   Gastroesophageal reflux disease with esophagitis without hemorrhage Yes    Assessment and Plan Assessment & Plan Gastroesophageal reflux disease with esophagitis Chronic GERD with esophagitis, symptoms improved with increased omeprazole . Discussed PPI timing, potential side effects on nutrient absorption, lifestyle modifications, and dietary triggers. Considered surgical/endoscopic options if medical management fails. - Continue omeprazole  40 mg every other day. - On alternate days, take 20 mg OTC omeprazole  or generic antacid (even famotidine). - Take omeprazole  30-45 minutes before  meals, so changing timing from current administration. - Report symptoms via MyChart in several weeks for any necessary dose adjustments. - Consider surgical/endoscopic treatments if symptoms persist.  When seen primary care, be sure that vitamin B12, iron levels and magnesium checked with regular lab work.   22 minutes were spent on this encounter (including chart review, history/exam, counseling/coordination of care, and documentation) > 50% of that time was spent on counseling and coordination of care.   Kerby Pearson III

## 2023-08-11 DIAGNOSIS — Z6828 Body mass index (BMI) 28.0-28.9, adult: Secondary | ICD-10-CM | POA: Diagnosis not present

## 2023-08-11 DIAGNOSIS — M5412 Radiculopathy, cervical region: Secondary | ICD-10-CM | POA: Diagnosis not present

## 2023-08-11 DIAGNOSIS — M542 Cervicalgia: Secondary | ICD-10-CM | POA: Diagnosis not present

## 2023-08-11 DIAGNOSIS — M5440 Lumbago with sciatica, unspecified side: Secondary | ICD-10-CM | POA: Diagnosis not present

## 2023-08-12 ENCOUNTER — Other Ambulatory Visit: Payer: Self-pay | Admitting: Neurosurgery

## 2023-08-12 DIAGNOSIS — M5440 Lumbago with sciatica, unspecified side: Secondary | ICD-10-CM

## 2023-08-16 DIAGNOSIS — M5136 Other intervertebral disc degeneration, lumbar region with discogenic back pain only: Secondary | ICD-10-CM | POA: Diagnosis not present

## 2023-08-16 DIAGNOSIS — M62830 Muscle spasm of back: Secondary | ICD-10-CM | POA: Diagnosis not present

## 2023-08-16 DIAGNOSIS — M9902 Segmental and somatic dysfunction of thoracic region: Secondary | ICD-10-CM | POA: Diagnosis not present

## 2023-08-16 DIAGNOSIS — M9903 Segmental and somatic dysfunction of lumbar region: Secondary | ICD-10-CM | POA: Diagnosis not present

## 2023-08-19 ENCOUNTER — Ambulatory Visit
Admission: RE | Admit: 2023-08-19 | Discharge: 2023-08-19 | Disposition: A | Discharge status: Home / Self Care | Source: Ambulatory Visit | Attending: Neurosurgery | Admitting: Neurosurgery

## 2023-08-19 DIAGNOSIS — M5440 Lumbago with sciatica, unspecified side: Secondary | ICD-10-CM | POA: Diagnosis not present

## 2023-08-19 DIAGNOSIS — M5126 Other intervertebral disc displacement, lumbar region: Secondary | ICD-10-CM | POA: Diagnosis not present

## 2023-08-19 DIAGNOSIS — M5137 Other intervertebral disc degeneration, lumbosacral region with discogenic back pain only: Secondary | ICD-10-CM | POA: Diagnosis not present

## 2023-08-19 DIAGNOSIS — M47816 Spondylosis without myelopathy or radiculopathy, lumbar region: Secondary | ICD-10-CM | POA: Diagnosis not present

## 2023-08-19 DIAGNOSIS — N281 Cyst of kidney, acquired: Secondary | ICD-10-CM | POA: Diagnosis not present

## 2023-09-03 ENCOUNTER — Other Ambulatory Visit: Payer: Self-pay | Admitting: Internal Medicine

## 2023-09-06 DIAGNOSIS — M5440 Lumbago with sciatica, unspecified side: Secondary | ICD-10-CM | POA: Diagnosis not present

## 2023-09-07 ENCOUNTER — Telehealth: Payer: Self-pay | Admitting: Internal Medicine

## 2023-09-07 DIAGNOSIS — M5136 Other intervertebral disc degeneration, lumbar region with discogenic back pain only: Secondary | ICD-10-CM | POA: Diagnosis not present

## 2023-09-07 DIAGNOSIS — M62830 Muscle spasm of back: Secondary | ICD-10-CM | POA: Diagnosis not present

## 2023-09-07 DIAGNOSIS — M9903 Segmental and somatic dysfunction of lumbar region: Secondary | ICD-10-CM | POA: Diagnosis not present

## 2023-09-07 DIAGNOSIS — M9902 Segmental and somatic dysfunction of thoracic region: Secondary | ICD-10-CM | POA: Diagnosis not present

## 2023-09-07 NOTE — Telephone Encounter (Signed)
 Spoke to pt, while scheduling his wife's toc visit, [place]  Ramnath. Pt asked if Dr. Cherlyn Cornet would be willing to accept him as well as a new pt, sicne Dr. Joelle Musca is retiring? Pt mentioned him & his wife, have always seen the same pcp. Is this TOC okay? Call back # [phone]

## 2023-09-07 NOTE — Telephone Encounter (Signed)
 Yes, that is okay.

## 2023-09-08 ENCOUNTER — Encounter: Payer: Self-pay | Admitting: Gastroenterology

## 2023-09-08 NOTE — Telephone Encounter (Signed)
 Dr Dominic Friendly do you want the pt to continue meds as he is taking?

## 2023-09-08 NOTE — Telephone Encounter (Signed)
 Spoke pt, sch toc for 11/03/23

## 2023-09-19 ENCOUNTER — Emergency Department (HOSPITAL_COMMUNITY): Admission: EM | Admit: 2023-09-19 | Discharge: 2023-09-19 | Disposition: A | Discharge status: Home / Self Care

## 2023-09-19 ENCOUNTER — Other Ambulatory Visit: Payer: Self-pay

## 2023-09-19 ENCOUNTER — Emergency Department (HOSPITAL_COMMUNITY)

## 2023-09-19 DIAGNOSIS — W01198A Fall on same level from slipping, tripping and stumbling with subsequent striking against other object, initial encounter: Secondary | ICD-10-CM | POA: Diagnosis not present

## 2023-09-19 DIAGNOSIS — R58 Hemorrhage, not elsewhere classified: Secondary | ICD-10-CM | POA: Diagnosis not present

## 2023-09-19 DIAGNOSIS — Z23 Encounter for immunization: Secondary | ICD-10-CM | POA: Insufficient documentation

## 2023-09-19 DIAGNOSIS — Y9301 Activity, walking, marching and hiking: Secondary | ICD-10-CM | POA: Insufficient documentation

## 2023-09-19 DIAGNOSIS — R55 Syncope and collapse: Secondary | ICD-10-CM | POA: Diagnosis not present

## 2023-09-19 DIAGNOSIS — Z79899 Other long term (current) drug therapy: Secondary | ICD-10-CM | POA: Insufficient documentation

## 2023-09-19 DIAGNOSIS — R220 Localized swelling, mass and lump, head: Secondary | ICD-10-CM | POA: Diagnosis not present

## 2023-09-19 DIAGNOSIS — Y92019 Unspecified place in single-family (private) house as the place of occurrence of the external cause: Secondary | ICD-10-CM | POA: Diagnosis not present

## 2023-09-19 DIAGNOSIS — R42 Dizziness and giddiness: Secondary | ICD-10-CM | POA: Diagnosis not present

## 2023-09-19 DIAGNOSIS — I4891 Unspecified atrial fibrillation: Secondary | ICD-10-CM | POA: Insufficient documentation

## 2023-09-19 DIAGNOSIS — Z743 Need for continuous supervision: Secondary | ICD-10-CM | POA: Diagnosis not present

## 2023-09-19 DIAGNOSIS — S0990XA Unspecified injury of head, initial encounter: Secondary | ICD-10-CM | POA: Diagnosis present

## 2023-09-19 DIAGNOSIS — R404 Transient alteration of awareness: Secondary | ICD-10-CM | POA: Diagnosis not present

## 2023-09-19 DIAGNOSIS — Z7901 Long term (current) use of anticoagulants: Secondary | ICD-10-CM | POA: Insufficient documentation

## 2023-09-19 DIAGNOSIS — R6889 Other general symptoms and signs: Secondary | ICD-10-CM | POA: Diagnosis not present

## 2023-09-19 DIAGNOSIS — S0101XA Laceration without foreign body of scalp, initial encounter: Secondary | ICD-10-CM | POA: Insufficient documentation

## 2023-09-19 DIAGNOSIS — R0902 Hypoxemia: Secondary | ICD-10-CM | POA: Diagnosis not present

## 2023-09-19 DIAGNOSIS — Z981 Arthrodesis status: Secondary | ICD-10-CM | POA: Diagnosis not present

## 2023-09-19 LAB — BASIC METABOLIC PANEL WITH GFR
Anion gap: 9 (ref 5–15)
BUN: 15 mg/dL (ref 8–23)
CO2: 23 mmol/L (ref 22–32)
Calcium: 8.9 mg/dL (ref 8.9–10.3)
Chloride: 105 mmol/L (ref 98–111)
Creatinine, Ser: 0.83 mg/dL (ref 0.61–1.24)
GFR, Estimated: >60 mL/min (ref >60)
Glucose, Bld: 155 mg/dL — ABNORMAL HIGH (ref 70–99)
Potassium: 3.7 mmol/L (ref 3.5–5.1)
Sodium: 137 mmol/L (ref 135–145)

## 2023-09-19 LAB — CBC
HCT: 40.8 % (ref 39.0–52.0)
Hemoglobin: 13.4 g/dL (ref 13.0–17.0)
MCH: 31.4 pg (ref 26.0–34.0)
MCHC: 32.8 g/dL (ref 30.0–36.0)
MCV: 95.6 fL (ref 80.0–100.0)
Platelets: 246 10*3/uL (ref 150–400)
RBC: 4.27 MIL/uL (ref 4.22–5.81)
RDW: 13.7 % (ref 11.5–15.5)
WBC: 5.2 10*3/uL (ref 4.0–10.5)
nRBC: 0 % (ref 0.0–0.2)

## 2023-09-19 MED ORDER — LACTATED RINGERS IV BOLUS
1000.0000 mL | Freq: Once | INTRAVENOUS | Status: AC
  Administered 2023-09-19: 1000 mL via INTRAVENOUS

## 2023-09-19 MED ORDER — ACETAMINOPHEN 500 MG PO TABS
1000.0000 mg | ORAL_TABLET | Freq: Once | ORAL | Status: AC
  Administered 2023-09-19: 1000 mg via ORAL
  Filled 2023-09-19: qty 2

## 2023-09-19 MED ORDER — LIDOCAINE-EPINEPHRINE (PF) 2 %-1:200000 IJ SOLN
10.0000 mL | Freq: Once | INTRAMUSCULAR | Status: AC
  Administered 2023-09-19: 10 mL
  Filled 2023-09-19: qty 20

## 2023-09-19 MED ORDER — TETANUS-DIPHTH-ACELL PERTUSSIS 5-2.5-18.5 LF-MCG/0.5 IM SUSY
0.5000 mL | PREFILLED_SYRINGE | Freq: Once | INTRAMUSCULAR | Status: AC
  Administered 2023-09-19: 0.5 mL via INTRAMUSCULAR
  Filled 2023-09-19: qty 0.5

## 2023-09-19 NOTE — ED Notes (Signed)
 Discharge paper work reviewed with pt. No questions at this time. No new onset distress at this time. Pt is leaving for home with his wife.

## 2023-09-19 NOTE — ED Triage Notes (Signed)
 According to [facility]: pt was walking to kitchen became dizzy and then passed out and fell backwards. Was found with laceration to the posterior left side of his head.Pt currently takes eliquis  for afib. Pt did not take eliquis  today but did take it yesterday.Pt was able stand up and ambulate to restroom.   Pt became nausea and had zofran  on way to hospital.  Pupils were initially equal and reactive now right is slihgtly smaller then left.  Pt has an 18 gauge piv in left forearm.  Gsc 15, aox4.  Vitals; 142/81 60 irregular afib hr 18 rr 150 cbg 92% spo2 on room air 96% on 2l

## 2023-09-19 NOTE — ED Notes (Signed)
 Trauma Response Nurse Documentation   Rodney Gray is a 73 y.o. male arriving to [facility] ED via EMS  On Eliquis  (apixaban ) daily. Trauma was activated as a Level 2 by ED Charge RN based on the following trauma criteria Elderly patients > 65 with head trauma on anti-coagulation (excluding ASA).  Patient cleared for CT by Dr. Charlee Conine. Pt transported to CT with trauma response nurse present to monitor. RN remained with the patient throughout their absence from the department for clinical observation.   GCS 15.  History   Past Medical History:  Diagnosis Date   Allergic rhinitis due to pollen    Atrial fibrillation (HCC)    A-Fib, 1987, 2003, 2005, EPS RX 2005 (A-Fib reocurred)   Atrial fibrillation (HCC)    Basal cell carcinoma    skin CA- squamous and Basal cell   BPH (benign prostatic hypertrophy)    Cataract    Fibromyalgia    History of kidney stones    kidney stones 2 years ago   Hyperlipemia    no per pt   Hypertension    no per pt   Kidney stone    Osteoarthritis    Polymyalgia rheumatica (HCC)    Squamous cell carcinoma in situ      Past Surgical History:  Procedure Laterality Date   ANTERIOR CERVICAL DECOMP/DISCECTOMY FUSION N/A 12/13/2022   Procedure: Anterior Cervical Decompression Fusion  Cervical five-Cervical six - Cervical six-Cervical seven;  Surgeon: Gearl Keens, MD;  Location: [facility] OR;  Service: Neurosurgery;  Laterality: N/A;   CAPSULOTOMY Bilateral 08/2007   CATARACT EXTRACTION Bilateral    both eyes   COLONOSCOPY     MENISCUS REPAIR Left 09/2009   left knee---Dr [facility]   NASAL SINUS SURGERY  May 2014   RETINAL TEAR REPAIR CRYOTHERAPY Right    right 12/06   RETINAL TEAR REPAIR CRYOTHERAPY Right    Retinal tear/bleed- Right 12/06   SQUAMOUS CELL CARCINOMA EXCISION Left 07/2008   left forearm   THUMB ARTHROSCOPY Left 1999     Initial Focused Assessment (If applicable, or please see trauma documentation): Airway: Intact, patent Breathing:  Breath sounds clear, equal bilaterally, No SOB or CP. Circulation: Laceration noted to posterior occiput bleeding currently controlled.  Dried blood also to eyebrows/nose. SBP WDL. Pt currently in a-fib on the monitor.  Central and peripheral pulses present.  18G PIV to L FA Disability: R pupil 3 reactive, L pupil 4 reactive.  A/Ox4. MAE equally with equal sensation throughout.   CT's Completed:   CT Head, CT Maxillofacial, and CT C-Spine   Interventions:  18G PIV to R [facility] Trauma labs drawn [facility] c-collar applied  Tdap given 1L LR given CT head, max face and C-spine   Plan for disposition:  Other Awaiting results.   Consults completed:  none at 1030.  Event Summary: Pt was at home with his wife finishing up his breakfast when he was carrying his plate into the kitchen, felt dizzy and fell.  Pt does not remember the fall itself but states that he woke up on the floor and his head was bleeding.  Pts wife heard the plate fall and immediately found patient on the floor.  Pt reports HA and some R shoulder/neck pain.  Pt does state that he has been having R shoulder/neck pain recently; prior to the fall today. Pt reports that he is on eliquis  for a-fib and took his dose yesterday but not yet today.  C-collar was placed on  pt while in CT by TRN.    Bedside handoff with ED RN Jessica Morn.    Juan Noel  Trauma Response RN  Please call TRN at [phone] for further assistance.

## 2023-09-19 NOTE — ED Notes (Signed)
 Rodney Gray bp 140/60

## 2023-09-19 NOTE — ED Notes (Signed)
Pt place in C Collar 

## 2023-09-19 NOTE — Progress Notes (Signed)
 Orthopedic Tech Progress Note Patient Details:  Rodney Gray 12-09-1950 [id number]  Level 2 trauma   Patient ID: TYREK LAWHORN, male   DOB: 12/02/1950, 73 y.o.   MRN: [id number]  Kermitt Pedlar 09/19/2023, 9:40 AM

## 2023-09-19 NOTE — ED Provider Notes (Signed)
 [place] EMERGENCY DEPARTMENT AT [facility] Provider Note   CSN: [id number] Arrival date & time: 09/19/23  4098     Patient presents with: No chief complaint on file.   Rodney Gray is a 73 y.o. male.   73 year old male with past medical history of atrial fibrillation on Eliquis  presenting to the emergency department today after a fall at home.  The patient presents as a level 2 trauma as he has a fall on thinners.  The patient states that he has been having some issues after taking his morning medications where he has had lightheadedness that has been going on for quite some time.  He has seen his primary care provider regarding this.  He came to the ER today after he became very dizzy and fell and hit his head.  He is unsure if he totally lost consciousness.  He states that his last Eliquis  use was yesterday.  Denies any blood in stool or dark stools.  He denies any associated chest pain or abdominal pain.  Denies any pain in his extremities.  He states that he did take a methocarbamol  for some back discomfort that has been going now for the past few days.  He states this is no worse since falling.        Prior to Admission medications   Medication Sig Start Date End Date Taking? Authorizing Provider  ELIQUIS  5 MG TABS tablet TAKE 1 TABLET BY MOUTH TWICE  DAILY 07/20/23  Yes Tammie Fall, MD  acetaminophen  (TYLENOL ) 650 MG CR tablet Take 650 mg by mouth at bedtime as needed for pain.    [provider]  ARTIFICIAL TEAR SOLUTION OP Place 1 drop into both eyes 3 (three) times daily.    [provider]  clindamycin  (CLEOCIN ) 150 MG capsule Take 4 capsules by mouth as needed. Prior to dental appointments Patient not taking: Reported on 08/04/2023 04/10/19   [provider]  diltiazem  (CARDIZEM  CD) 180 MG 24 hr capsule TAKE 1 CAPSULE BY MOUTH EVERY DAY 09/05/23   Tammie Fall, MD  methocarbamol  (ROBAXIN ) 500 MG tablet Take 1 tablet (500 mg  total) by mouth at bedtime. Patient not taking: Reported on 08/04/2023 12/13/22   Meyran, Kenard Paul, NP  montelukast  (SINGULAIR ) 10 MG tablet Take 1 tablet (10 mg total) by mouth as needed. Allergies Patient not taking: Reported on 08/04/2023 02/11/23   Helaine Llanos, MD  Multiple Vitamins-Minerals (MENS MULTIVITAMIN PLUS PO) Take 1 capsule by mouth daily.    [provider]  Omega-3 Fatty Acids (FISH OIL) 1200 MG CAPS Take 1 capsule by mouth daily.    [provider]  omeprazole  (PRILOSEC) 20 MG capsule TAKE 1 CAPSULE BY MOUTH AT BEDTIME. Patient not taking: Reported on 08/04/2023 09/16/22   Helaine Llanos, MD  omeprazole  (PRILOSEC) 40 MG capsule Take 1 capsule (40 mg total) by mouth daily. Take 1 at evening meal 05/11/23   Albertina Hugger, MD  predniSONE  (DELTASONE ) 1 MG tablet Take 1 tablet (1 mg total) by mouth every other day. Along with a 5 mg tab Patient not taking: Reported on 08/04/2023 11/18/22   Helaine Llanos, MD  predniSONE  (DELTASONE ) 5 MG tablet Take 1 tablet (5 mg total) by mouth every other day. Wean as directed Patient not taking: Reported on 08/04/2023 05/27/23   Letvak, Richard I, MD  tadalafil  (CIALIS ) 10 MG tablet Take 1 tablet (10 mg total) by mouth daily. OK to take additional  10mg  boost dose as needed 45 minutes prior to sexual activity Patient not taking: Reported on 08/04/2023 12/09/22   Lawerence Pressman, MD    Allergies: Ampicillin    Review of Systems  Neurological:  Positive for light-headedness.  All other systems reviewed and are negative.   Updated Vital Signs BP (!) 120/55   Pulse (!) 50   Temp 97.7 F (36.5 C) (Oral)   Resp 14   Ht 5' 8 (1.727 m)   Wt 83.9 kg   SpO2 96%   BMI 28.13 kg/m   Physical Exam Vitals and nursing note reviewed.   Gen: NAD Eyes: Right pupil is 4 mm and reactive, left pupil is 2 mm and reactive HEENT: no oropharyngeal swelling Neck: trachea midline, no cervical spine tenderness in the midline although  there is some right sided paraspinal tenderness, no stepoffs or deformities Resp: clear to auscultation bilaterally Card: RRR, no murmurs, rubs, or gallops Abd: nontender, nondistended, no seatbelt sign Extremities: no calf tenderness, no edema MSK: no thoracic spinal tenderness, no lumbar spinal tenderness, no step-offs or deformities Vascular: 2+ radial pulses bilaterally, 2+ DP pulses bilaterally Neuro: Alert and oriented x 3, equal strength sensation throughout bilateral upper and lower extremities Skin: Posterior scalp laceration noted   (all labs ordered are listed, but only abnormal results are displayed) Labs Reviewed  BASIC METABOLIC PANEL WITH GFR - Abnormal; Notable for the following components:      Result Value   Glucose, Bld 155 (*)    All other components within normal limits  CBC    EKG: None  Radiology: CT Cervical Spine Wo Contrast Result Date: 09/19/2023 CLINICAL DATA:  73 year old male with dizziness, syncope and fell backwards. Posterior head laceration. EXAM: CT CERVICAL SPINE WITHOUT CONTRAST TECHNIQUE: Multidetector CT imaging of the cervical spine was performed without intravenous contrast. Multiplanar CT image reconstructions were also generated. RADIATION DOSE REDUCTION: This exam was performed according to the departmental dose-optimization program which includes automated exposure control, adjustment of the mA and/or kV according to patient size and/or use of iterative reconstruction technique. COMPARISON:  CT head and face today reported separately. Cervical spine MRI 10/02/2022. FINDINGS: Alignment: Straightening of cervical lordosis appears increased from last year. Cervicothoracic junction alignment is within normal limits. Bilateral posterior element alignment is within normal limits. Skull base and vertebrae: Postoperative changes new from the previous MRI are detailed below. Bone mineralization is within normal limits for age. Visualized skull base is  intact. No atlanto-occipital dissociation. C1 and C2 appear intact and aligned. No acute osseous abnormality identified. Soft tissues and spinal canal: No prevertebral fluid or swelling. No visible canal hematoma. Negative visible noncontrast neck soft tissues. Disc levels: C5-C6 and C6-C7 ACDF, new from the previous MRI. No hardware loosening or adverse features identified. Mild for age cervical spine degeneration elsewhere appears stable from last year. Upper chest: Visible upper thoracic levels appear intact. Negative lung apices. Mildly capacious upper thoracic esophagus. IMPRESSION: 1. No acute traumatic injury identified in the cervical spine. 2. C5-C6 and C6-C7 ACDF since from last year with no adverse features. Electronically Signed   By: Marlise Simpers M.D.   On: 09/19/2023 10:06   CT Maxillofacial Wo Contrast Result Date: 09/19/2023 CLINICAL DATA:  73 year old male with dizziness, syncope and fell backwards. Posterior head laceration. EXAM: CT MAXILLOFACIAL WITHOUT CONTRAST TECHNIQUE: Multidetector CT imaging of the maxillofacial structures was performed. Multiplanar CT image reconstructions were also generated. RADIATION DOSE REDUCTION: This exam was performed according to the departmental dose-optimization  program which includes automated exposure control, adjustment of the mA and/or kV according to patient size and/or use of iterative reconstruction technique. COMPARISON:  Head and cervical spine CT today reported separately. FINDINGS: Osseous: Mandible intact and normally located. No acute dental finding. Bilateral maxilla, zygoma, pterygoid, nasal bones appear intact. Central skull base intact. Orbits: Orbital walls intact. Chronic postoperative changes to the globes. Other intraorbital soft tissues appear negative. Sinuses: Mild ethmoid sinus and frontoethmoidal recess mucoperiosteal thickening. No layering sinus fluid or hemorrhage. Tympanic cavities and mastoids appear clear. Soft tissues: Negative  visible noncontrast larynx, pharynx, parapharyngeal spaces, retropharyngeal space, sublingual space, submandibular spaces, masticator and parotid spaces. Trace gas in the right facial vein on series 3, image 69 is likely IV access related. Limited intracranial: Stable to that reported separately. IMPRESSION: No acute traumatic injury identified in the Face. Electronically Signed   By: Marlise Simpers M.D.   On: 09/19/2023 10:03   CT Head Wo Contrast Result Date: 09/19/2023 CLINICAL DATA:  73 year old male with dizziness, syncope and fell backwards. Posterior head laceration. EXAM: CT HEAD WITHOUT CONTRAST TECHNIQUE: Contiguous axial images were obtained from the base of the skull through the vertex without intravenous contrast. RADIATION DOSE REDUCTION: This exam was performed according to the departmental dose-optimization program which includes automated exposure control, adjustment of the mA and/or kV according to patient size and/or use of iterative reconstruction technique. COMPARISON:  Brain MRI 05/17/2017. Face and cervical spine CT today reported separately. FINDINGS: Brain: Cerebral volume remains normal for age. No midline shift, ventriculomegaly, mass effect, evidence of mass lesion, intracranial hemorrhage or evidence of cortically based acute infarction. Gray-white matter differentiation is normal for age throughout the brain. Vascular: No suspicious intracranial vascular hyperdensity. Mild Calcified atherosclerosis at the skull base. Skull: Intact.  No fracture identified. Sinuses/Orbits: Ethmoid sinus mucosal thickening bilaterally is mild-to-moderate. No layering sinus hemorrhage or fluid levels. Tympanic cavities and mastoids appear clear. Other: Left posterior scalp soft tissue injury with soft tissue gas tracking to the periosteum on series 4, image 53. Underlying calvarium appears intact. Other scalp soft tissues appear intact. Stable and negative orbits. IMPRESSION: 1. Left posterior scalp soft  tissue injury may extend to the periosteum. No skull fracture. 2. No acute intracranial abnormality. Normal for age noncontrast CT appearance of the brain. 3. Face and cervical spine CT reported separately. Electronically Signed   By: Marlise Simpers M.D.   On: 09/19/2023 10:01     .Laceration Repair  Date/Time: 09/19/2023 12:34 PM  Performed by: Carin Charleston, MD Authorized by: Carin Charleston, MD   Consent:    Consent obtained:  Verbal   Consent given by:  Patient   Risks discussed:  Infection, pain, poor cosmetic result, need for additional repair, nerve damage, poor wound healing and vascular damage Universal protocol:    Immediately prior to procedure, a time out was called: yes     Patient identity confirmed:  Verbally with patient Anesthesia:    Anesthesia method:  Local infiltration   Local anesthetic:  Lidocaine  2% WITH epi Laceration details:    Location:  Scalp   Scalp location:  Occipital   Length (cm):  6   Depth (mm):  7 Pre-procedure details:    Preparation:  Patient was prepped and draped in usual sterile fashion and imaging obtained to evaluate for foreign bodies Exploration:    Hemostasis achieved with:  Epinephrine   Imaging outcome: foreign body not noted     Wound exploration: entire depth of wound  visualized     Contaminated: no   Treatment:    Area cleansed with:  Saline   Amount of cleaning:  Standard   Irrigation solution:  Sterile saline   Irrigation method:  Pressure wash   Visualized foreign bodies/material removed: no     Debridement:  Minimal   Undermining:  None   Scar revision: no     Layers/structures repaired:  Chestine Costain:    Suture size:  4-0   Suture material:  Vicryl   Suture technique:  Horizontal mattress   Number of sutures:  3 Skin repair:    Repair method:  Sutures   Suture size:  4-0   Suture material:  Prolene   Suture technique:  Simple interrupted   Number of sutures:  7 Approximation:    Approximation:  Close Repair type:     Repair type:  Complex Post-procedure details:    Dressing:  Open (no dressing)   Procedure completion:  Tolerated Comments:     Star-shaped laceration with an area of skin/tissue in the middle of the defect that was not viable.  This was debrided and 3 horizontal mattress sutures were placed in the galea prior to repairing the skin with sutures.    Medications Ordered in the ED  lactated ringers  bolus 1,000 mL (0 mLs Intravenous Stopped 09/19/23 1040)  Tdap (BOOSTRIX) injection 0.5 mL (0.5 mLs Intramuscular Given 09/19/23 0955)  lidocaine -EPINEPHrine (XYLOCAINE  W/EPI) 2 %-1:200000 (PF) injection 10 mL (10 mLs Infiltration Given by Other 09/19/23 1118)  acetaminophen  (TYLENOL ) tablet 1,000 mg (1,000 mg Oral Given 09/19/23 1219)                                    Medical Decision Making 73 year old male with past medical history of atrial fibrillation on Eliquis  presenting to the emergency department today with headache after a near syncopal episode earlier today.  I will further evaluate the patient here with basic labs to evaluate for anemia or electrolyte abnormalities.  Will obtain EKG to evaluate for conduction abnormalities.  Will keep him on the cardiac monitor to evaluate for arrhythmias.  I will obtain a CT scan of his head, cervical spine, maxillofacial bones for further evaluation for acute traumatic injuries.  Will give him IV fluids here and update his tetanus.  I will reevaluate for ultimate disposition.  The patient's labs here are reassuring.  He remained stable here on the cardiac monitor.  His blood pressures here are stable.  The patient is ambulatory here without any further dizziness.  Given his age and risk factors did discuss possible admission for further telemetry and echocardiogram.  The patient is feeling better on reassessment and is ultimately discharged through shared decision making.  He reports that this has been more of a chronic issue that occurs after eating with  standing for months and this does seem consistent with orthostasis.  Discussed this with the patient and encouraged lifestyle modifications to help with this.  He is ultimately discharged with return precautions.  Amount and/or Complexity of Data Reviewed Labs: ordered. Radiology: ordered.  Risk OTC drugs. Prescription drug management.        Final diagnoses:  Syncope, unspecified syncope type  Laceration of scalp, initial encounter    ED Discharge Orders     None          Carin Charleston, MD 09/19/23 1330

## 2023-09-19 NOTE — ED Notes (Signed)
 Pt taken to CT with trauma RN.

## 2023-09-19 NOTE — ED Notes (Signed)
 Pt ambulated to restroom and down the hall with pulse ox with steady gait with no complaints of dizziness

## 2023-09-19 NOTE — Discharge Instructions (Addendum)
 Please drink plenty of fluids over the next few days and take your time going from lying to sitting and sitting to standing.  Please call your doctor and follow-up for the echocardiogram as scheduled.  Please return to the emergency department for worsening symptoms.  Please follow-up with your doctor, urgent care, or come back to the emergency department in 5 to 8 days to have the sutures removed.  Return to the ER sooner for fevers, drainage, or worsening symptoms.

## 2023-09-19 NOTE — Progress Notes (Signed)
   09/19/23 0900  Spiritual Encounters  Type of Visit Attempt (pt unavailable)  Care provided to: Pt not available  Reason for visit Trauma  OnCall Visit No

## 2023-09-20 ENCOUNTER — Ambulatory Visit: Payer: Self-pay

## 2023-09-20 ENCOUNTER — Telehealth: Payer: Self-pay | Admitting: Internal Medicine

## 2023-09-20 NOTE — Telephone Encounter (Signed)
 Pt c/o Syncope: STAT if syncope occurred within 24 hours and pt complains of lightheadedness  Did you pass out today? No  When is the last time you passed out? Passed out yesterday  Has this occurred multiple times? No Did you have any symptoms prior to passing out? Felt light headed  5. Did you fall? If so, are you on a blood thinner? yes, and he is taking Eliquis . Patient's wife stated the patient has been complaining of feeling lightheaded after breakfast. Yesterday after eating breakfast, he pasted out and hit his head. Patient was bleeding and was sent to the ED in regard to this. Patient's wife is concerned about the patient waiting to have an Echo completed. Please advise.

## 2023-09-20 NOTE — Telephone Encounter (Signed)
 Answer Assessment - Initial Assessment Questions 1. DESCRIPTION: Describe your dizziness.     Patient had a fall from standing on 6/16.  He was evaluated and treated in ED.  He had no dizziness in ED, but had dizziness today 3. VERTIGO: Do you feel like either you or the room is spinning or tilting? (i.e. vertigo)     History of dizziness/vertigo.  Appointment scheduled with ENT for evaluation on 09/21/2023 4. SEVERITY: How bad is it?  Do you feel like you are going to faint? Can you stand and walk?   - MILD: Feels slightly dizzy, but walking normally.   - MODERATE: Feels unsteady when walking, but not falling; interferes with normal activities (e.g., school, work).   - SEVERE: Unable to walk without falling, or requires assistance to walk without falling; feels like passing out now.      moderate 5. ONSET:  When did the dizziness begin?     Ongoing  6. AGGRAVATING FACTORS: Does anything make it worse? (e.g., standing, change in head position)     Standing, change in position  8. CAUSE: What do you think is causing the dizziness?     Underlying dizziness/vertigo, but did have head injury on 09/19/2023 9. RECURRENT SYMPTOM: Have you had dizziness before? If Yes, ask: When was the last time? What happened that time?     Recurrent, intermittent dizziness/vertigo 10. OTHER SYMPTOMS: Do you have any other symptoms? (e.g., fever, chest pain, vomiting, diarrhea, bleeding)       Nausea, using otc emetrol for nausea control  Protocols used: Dizziness - Lightheadedness-A-AH  Patient called to schedule follow up appointment for suture removal and was transferred to NT for symptoms. Patient was assessed and treated in ED on 6/16 for head injury following fall from syncopal episode.  She has already spoken to cardiology today and has follow up appointments with ENT scheduled. She is following up by phone today with neuro as well. She verbalizes understanding of all reportable  signs and symptoms.  She will call office, go to ED, or activate EMS if needed

## 2023-09-20 NOTE — Telephone Encounter (Signed)
 Spoke to patient and wife.He stated he is having dizziness when he first stands up.He had a syncopal episode yesterday morning.EMT took him to ED.Stated a head and neck CT was done and was normal.Stated he is also having vertigo.He has appointment with ENT tomorrow.He would like appointment.Appointment scheduled with Katlyn West NP 6/20 at 11:20 am.

## 2023-09-20 NOTE — Telephone Encounter (Signed)
    Copied from CRM [id number]. Topic: Clinical - Red Word Triage >> Sep 20, 2023 10:02 AM Allison Arena wrote: [facility] that prompted transfer to Nurse Triage: Fall. Patient fell yesterday, hit head, EMS came to take him to [facility] where he got stitches in his head. Still has a great amount of head pain and vertigo. Answer Assessment - Initial Assessment Questions 1. MECHANISM: How did the injury happen? For falls, ask: What height did you fall from? and What surface did you fall against?      Syncopal episode yesterday, fall from standing to floor 2. ONSET: When did the injury happen? (Minutes or hours ago)      24 hours ago, seen in ED via EMS 3. NEUROLOGIC SYMPTOMS: Was there any loss of consciousness? Are there any other neurological symptoms?      Loss of consciousness at time of injury.  Dizziness today 4. MENTAL STATUS: Does the person know who they are, who you are, and where they are?      Alert and oriented x 3 5. LOCATION: What part of the head was hit?      Left side of head 6. SCALP APPEARANCE: What does the scalp look like? Is it bleeding now? If Yes, ask: Is it difficult to stop?      Laceration, treated in ED 7. SIZE: For cuts, bruises, or swelling, ask: How large is it? (e.g., inches or centimeters)      Already assessed 8. PAIN: Is there any pain? If Yes, ask: How bad is it?  (e.g., Scale 1-10; or mild, moderate, severe)  Moderate to severe pain to left side of body (side affected by fall) 9. TETANUS: For any breaks in the skin, ask: When was the last tetanus booster?      10. OTHER SYMPTOMS: Do you have any other symptoms? (e.g., neck pain, vomiting)       Has had nausea last night and this morning, taking OTC emetrol for nausea and tylenol  for pain.  Protocols used: Head Injury-A-AH

## 2023-09-20 NOTE — Telephone Encounter (Signed)
 Spoke to pt's wife. He is going to ENT tomorrow. If no improvement after that, they will make him an appt sooner.

## 2023-09-21 DIAGNOSIS — H8113 Benign paroxysmal vertigo, bilateral: Secondary | ICD-10-CM | POA: Diagnosis not present

## 2023-09-22 NOTE — Progress Notes (Signed)
 Cardiology Office Note    Date:  09/24/2023  ID:  SCOT SHIRAISHI, DOB [birth year], MRN [id number] PCP:  Jimmy Charlie FERNS, MD  Cardiologist:  Danelle Birmingham, MD  Electrophysiologist:  None   Chief Complaint: Follow up for syncope   History of Present Illness: Norwood Quezada Ferencz is a 73 y.o. male with visit-pertinent history of chronic atrial fibrillation and hypertension.  Patient has been followed by Dr. Birmingham for atrial fibrillation.    Echocardiogram on 10/13/2022 indicated LVEF of 60 to 65%, no RWMA, diastolic parameters were indeterminate, RV systolic function and size was normal, normal PASP, LA was severely dilated, RA was severely dilated, moderate mitral valve regurgitation with no evidence of stenosis, mild aortic valve regurgitation with no evidence of stenosis.  He was last seen in clinic on 09/27/2022.  It was noted that he had remained in rate controlled atrial fibrillation for over 14 years and was asymptomatic.  He denies any palpitations.  On 09/19/2023 patient presented to the emergency department after a fall at home.  Patient reported he been having some issues after taking his morning medications where he had increased lightheadedness that been ongoing for quite some time.  Per notes patient became dizzy and fell and hit his head.  He was unsure if he totally lost consciousness.  Patient reported he did take some methocarbamol  for back discomfort that have been ongoing for the past few days.  Head CT with left posterior scalp soft tissue injury, no skull fracture, no acute intracranial abnormalities, CT cervical spine with no acute traumatic injury identified in the cervical spine and CT of the face with no acute traumatic injury.  Patient did have a star-shaped laceration with an area of skin/tissue in the middle of the defect that was not viable, this was debrided in a 3 horizontal mattress sutures were placed.  Patient was given IV fluids and questioned if event was  related to possible orthostasis as patient reported he been having trouble recently with dizziness with position changes or prolonged standing.  Patient's lab work was stable and he was discharged with recommendation to follow-up with his primary care.  Today he presents regarding episode of syncope last week.  Patient reports that last week after he had finished eating a meal he had stood up and was walking into his kitchen when he became dizzy/lightheaded, patient's note the last thing he remembers is stretching his arm out to brace himself then waking up on the floor.  Patient reports that he is unsure if he lost consciousness while falling or after he hit his head on the floor.  His wife notes that she was with him and that his eyes were open and fixed, he was not responsive for under 2 minutes.  Patient denies any palpitations or feeling of irregular heartbeats surrounding the event.  Patient notes that in recent months he has been having episodes in which he will feel lightheaded for a few seconds then resolves after a deep breath.  He notes sometimes this occurs with position changes such as with sitting to standing however this could also occur while he was sitting and eating a meal.  Patient notes following his hospital stay he did have a few episodes of vertigo, was seen by ENT and has overall resolved.  Patient denies any episodes of lightheadedness, presyncope or syncope since his evaluation in the emergency department last week.  He denies any chest pain, shortness of breath, lower extremity edema,  orthopnea or PND.  Labwork independently reviewed: 09/19/2023: Sodium 137, potassium 3.7, creatinine 0.83, hemoglobin 13.4, hematocrit 40.8 ROS: .   Today he denies chest pain, shortness of breath, lower extremity edema, fatigue, palpitations, melena, hematuria, hemoptysis, diaphoresis, weakness, orthopnea, and PND.  All other systems are reviewed and otherwise negative. Studies Reviewed: SABRA   EKG:   EKG is ordered today, personally reviewed, demonstrating  EKG Interpretation Date/Time:  Friday September 23 2023 11:29:10 EDT Ventricular Rate:  55 PR Interval:    QRS Duration:  84 QT Interval:  396 QTC Calculation: 378 R Axis:   35  Text Interpretation: Atrial fibrillation with slow ventricular response Confirmed by Glyn Zendejas [phone]) on 09/23/2023 7:33:20 PM   CV Studies: Cardiac studies reviewed are outlined and summarized above. Otherwise please see EMR for full report. Cardiac Studies & Procedures   ______________________________________________________________________________________________     ECHOCARDIOGRAM  ECHOCARDIOGRAM COMPLETE 10/13/2022  Narrative ECHOCARDIOGRAM REPORT    Patient Name:   CALLIE BUNYARD [facility] Date of Exam: 10/13/2022 Medical Rec #:  [id number]            Height:       68.0 in Accession #:    [id number]           Weight:       183.8 lb Date of Birth:  01-Oct-1950            BSA:          1.972 m Patient Age:    72 years             BP:           134/68 mmHg Patient Gender: M                    HR:           57-80 bpm. Exam Location:  [facility]  Procedure: 2D Echo, Cardiac Doppler and Color Doppler  Indications:    I48.2 Chronic atrial fibrillation  History:        Patient has no prior history of Echocardiogram examinations. Arrythmias:Atrial Fibrillation; Risk Factors:Hypertension and Non-Smoker. Patient has had numerous echoes in [place] primarily due to his Afib.  Sonographer:    Arley Pac RDMS, RVT, RDCS Referring Phys: [id number] DANELLE ORN [facility]  IMPRESSIONS   1. Left ventricular ejection fraction, by estimation, is 60 to 65%. The left ventricle has normal function. The left ventricle has no regional wall motion abnormalities. Left ventricular diastolic parameters are indeterminate. 2. Right ventricular systolic function is normal. The right ventricular size is normal. There is normal pulmonary artery systolic pressure. The estimated  right ventricular systolic pressure is 33.1 mmHg. 3. Left atrial size was severely dilated. 4. Right atrial size was severely dilated. 5. The mitral valve is normal in structure. Moderate mitral valve regurgitation. No evidence of mitral stenosis. 6. The aortic valve is normal in structure. Aortic valve regurgitation is mild. No aortic stenosis is present. Aortic valve mean gradient measures 4.6 mmHg. 7. The inferior vena cava is normal in size with greater than 50% respiratory variability, suggesting right atrial pressure of 3 mmHg.  FINDINGS Left Ventricle: Left ventricular ejection fraction, by estimation, is 60 to 65%. The left ventricle has normal function. The left ventricle has no regional wall motion abnormalities. The left ventricular internal cavity size was normal in size. There is no left ventricular hypertrophy. Left ventricular diastolic parameters are indeterminate.  Right Ventricle: The right ventricular size is normal. No increase  in right ventricular wall thickness. Right ventricular systolic function is normal. There is normal pulmonary artery systolic pressure. The tricuspid regurgitant velocity is 2.65 m/s, and with an assumed right atrial pressure of 5 mmHg, the estimated right ventricular systolic pressure is 33.1 mmHg.  Left Atrium: Left atrial size was severely dilated.  Right Atrium: Right atrial size was severely dilated.  Pericardium: There is no evidence of pericardial effusion.  Mitral Valve: The mitral valve is normal in structure. Moderate mitral valve regurgitation. No evidence of mitral valve stenosis.  Tricuspid Valve: The tricuspid valve is normal in structure. Tricuspid valve regurgitation is mild . No evidence of tricuspid stenosis.  Aortic Valve: The aortic valve is normal in structure. Aortic valve regurgitation is mild. Aortic regurgitation PHT measures 720 msec. No aortic stenosis is present. Aortic valve mean gradient measures 4.6 mmHg. Aortic valve  peak gradient measures 8.7 mmHg. Aortic valve area, by VTI measures 2.26 cm.  Pulmonic Valve: The pulmonic valve was normal in structure. Pulmonic valve regurgitation is not visualized. No evidence of pulmonic stenosis.  Aorta: The aortic root is normal in size and structure.  Venous: The inferior vena cava is normal in size with greater than 50% respiratory variability, suggesting right atrial pressure of 3 mmHg.  IAS/Shunts: No atrial level shunt detected by color flow Doppler.   LEFT VENTRICLE PLAX 2D LVIDd:         5.30 cm LVIDs:         3.60 cm LV PW:         1.10 cm LV IVS:        1.30 cm LVOT diam:     2.10 cm LV SV:         69 LV SV Index:   35 LVOT Area:     3.46 cm  LV Volumes (MOD) LV vol d, MOD A2C: 79.5 ml LV vol d, MOD A4C: 89.0 ml LV vol s, MOD A2C: 34.1 ml LV vol s, MOD A4C: 37.6 ml LV SV MOD A2C:     45.4 ml LV SV MOD A4C:     89.0 ml LV SV MOD BP:      50.8 ml  RIGHT VENTRICLE             IVC RV Basal diam:  3.40 cm     IVC diam: 2.00 cm RV S prime:     11.75 cm/s TAPSE (M-mode): 2.7 cm  LEFT ATRIUM              Index        RIGHT ATRIUM           Index LA diam:        5.10 cm  2.59 cm/m   RA Area:     38.10 cm LA Vol (A2C):   141.0 ml 71.51 ml/m  RA Volume:   159.00 ml 80.64 ml/m LA Vol (A4C):   98.5 ml  49.96 ml/m LA Biplane Vol: 127.0 ml 64.41 ml/m AORTIC VALVE                    PULMONIC VALVE AV Area (Vmax):    2.33 cm     PV Vmax:       1.21 m/s AV Area (Vmean):   2.23 cm     PV Peak grad:  5.8 mmHg AV Area (VTI):     2.26 cm AV Vmax:           147.60 cm/s AV Vmean:  98.180 cm/s AV VTI:            0.303 m AV Peak Grad:      8.7 mmHg AV Mean Grad:      4.6 mmHg LVOT Vmax:         99.28 cm/s LVOT Vmean:        63.275 cm/s LVOT VTI:          0.198 m LVOT/AV VTI ratio: 0.65 AI PHT:            720 msec AR Vena Contracta: 0.30 cm  AORTA Ao Root diam: 3.40 cm Ao Asc diam:  3.40 cm Ao Arch diam: 2.9 cm  TRICUSPID  VALVE TR Peak grad:   28.1 mmHg TR Vmax:        265.00 cm/s  SHUNTS Systemic VTI:  0.20 m Systemic Diam: 2.10 cm  Evalene Lunger MD Electronically signed by Evalene Lunger MD Signature Date/Time: 10/13/2022/5:31:32 PM    Final          ______________________________________________________________________________________________       Current Reported Medications:.    Current Meds  Medication Sig   acetaminophen  (TYLENOL ) 650 MG CR tablet Take 650 mg by mouth at bedtime as needed for pain.   ARTIFICIAL TEAR SOLUTION OP Place 1 drop into both eyes 3 (three) times daily.   clindamycin  (CLEOCIN ) 150 MG capsule Take 4 capsules by mouth as needed. Prior to dental appointments   diltiazem  (CARDIZEM  CD) 180 MG 24 hr capsule TAKE 1 CAPSULE BY MOUTH EVERY DAY   ELIQUIS  5 MG TABS tablet TAKE 1 TABLET BY MOUTH TWICE  DAILY   montelukast  (SINGULAIR ) 10 MG tablet Take 1 tablet (10 mg total) by mouth as needed. Allergies   Multiple Vitamins-Minerals (MENS MULTIVITAMIN PLUS PO) Take 1 capsule by mouth daily.   Omega-3 Fatty Acids (FISH OIL) 1200 MG CAPS Take 1 capsule by mouth daily.   omeprazole  (PRILOSEC) 20 MG capsule TAKE 1 CAPSULE BY MOUTH AT BEDTIME.   omeprazole  (PRILOSEC) 40 MG capsule Take 1 capsule (40 mg total) by mouth daily. Take 1 at evening meal (Patient taking differently: Take 40 mg by mouth every other day. Take 1 at evening meal)   predniSONE  (DELTASONE ) 1 MG tablet Take 1 tablet (1 mg total) by mouth every other day. Along with a 5 mg tab   predniSONE  (DELTASONE ) 5 MG tablet Take 1 tablet (5 mg total) by mouth every other day. Wean as directed   tadalafil  (CIALIS ) 10 MG tablet Take 1 tablet (10 mg total) by mouth daily. OK to take additional 10mg  boost dose as needed 45 minutes prior to sexual activity    Physical Exam:    VS:  BP (!) 146/78   Pulse 72   Resp 16   Ht 5' 8 (1.727 m)   Wt 181 lb 9.6 oz (82.4 kg)   SpO2 99%   BMI 27.61 kg/m    Wt Readings from  Last 3 Encounters:  09/23/23 181 lb 9.6 oz (82.4 kg)  09/19/23 185 lb (83.9 kg)  08/04/23 185 lb (83.9 kg)    GEN: Well nourished, well developed in no acute distress NECK: No JVD; No carotid bruits CARDIAC: RRR, no murmurs, rubs, gallops RESPIRATORY:  Clear to auscultation without rales, wheezing or rhonchi  ABDOMEN: Soft, non-tender, non-distended EXTREMITIES:  No edema; No acute deformity     Asessement and Plan:SABRA    Syncope: Patient with an episode of syncope last week, he reports that he had just  finished eating and had stood from his chair to walk into the kitchen when he became dizzy and lightheaded, patient notes lasting remembers stretching his arm out to balance himself on the wall and waking on the floor.  He he is unsure if he lost consciousness then fell resulting in his head striking the floor or if he lost consciousness as a result of his head striking the floor.  Discussed with patient that we will treat as a true syncopal event.  ED workup was reassuring, patient was treated with fluids and it was questioned if this was orthostatic in nature.  Patient is not orthostatic on exam today.  Patient denies any recurrence.  Patient does report that he has had intermittent dizzy/lightheaded episodes during meals or after meals for a few months, some can occur with position changes although some can occur while he is still sitting and eating.  He denies any palpitations or feeling of irregular heartbeats.  Will have patient wear a 30-day cardiac monitor, check carotid duplex, he already has an echocardiogram ordered and scheduled for 10/05/2023.  Lab work in the emergency room was unrevealing, will check a magnesium and TSH level today.  Reviewed ED precautions with patient. I did discuss the [place] DMV medical guidelines for driving: it is prudent to recommend that all persons should be free of syncopal episodes for at least six months to be granted the driving privilege. (THE [place]   PHYSICIAN'S GUIDE TO DRIVER MEDICAL EVALUATION, Second Edition, Medical Review Branch, Associate Professor, Division of [employer], [place]  Department of Transportation, July 2004)   Chronic atrial fibrillation: Patient with long history of chronic atrial fibrillation, followed by Dr. Waddell.  Patient denies any palpitations or feeling of irregular heartbeats.  He denies any bleeding problems on Eliquis .  HTN: Blood pressure today 137/82, on recheck was 146/78.  Patient was not orthostatic at visit today.  Will continue diltiazem  180 mg daily.  Encouraged patient to monitor his blood pressure at home, which he notes is typically very well-controlled.  He will notify the office if consistently elevated above 130/80.  Moderate mitral valve regurgitation: Noted on echocardiogram in 10/2022.  Patient has repeat echocardiogram ordered for 10/19/2023.    Disposition: F/u with Dr. Waddell in July, 2025 as scheduled.   Signed, Eleftherios Dudenhoeffer D Kaiyan Luczak, NP

## 2023-09-23 ENCOUNTER — Ambulatory Visit

## 2023-09-23 ENCOUNTER — Ambulatory Visit: Attending: Cardiology | Admitting: Cardiology

## 2023-09-23 VITALS — BP 146/78 | HR 72 | Resp 16 | Ht 68.0 in | Wt 181.6 lb

## 2023-09-23 DIAGNOSIS — I4819 Other persistent atrial fibrillation: Secondary | ICD-10-CM

## 2023-09-23 DIAGNOSIS — I10 Essential (primary) hypertension: Secondary | ICD-10-CM | POA: Diagnosis not present

## 2023-09-23 DIAGNOSIS — I059 Rheumatic mitral valve disease, unspecified: Secondary | ICD-10-CM | POA: Diagnosis not present

## 2023-09-23 DIAGNOSIS — R42 Dizziness and giddiness: Secondary | ICD-10-CM | POA: Diagnosis not present

## 2023-09-23 DIAGNOSIS — R55 Syncope and collapse: Secondary | ICD-10-CM | POA: Insufficient documentation

## 2023-09-23 NOTE — Patient Instructions (Signed)
 Medication Instructions:  No changes *If you need a refill on your cardiac medications before your next appointment, please call your pharmacy*  Lab Work: Today we are going to draw a mag and Tsh levels If you have labs (blood work) drawn today and your tests are completely normal, you will receive your results only by: MyChart Message (if you have MyChart) OR A paper copy in the mail If you have any lab test that is abnormal or we need to change your treatment, we will call you to review the results.  Testing/Procedures: Your physician has requested that you have a carotid duplex. This test is an ultrasound of the carotid arteries in your neck. It looks at blood flow through these arteries that supply the brain with blood. Allow one hour for this exam. There are no restrictions or special instructions. This will take place at [address], 4th floor  Please note: We ask at that you not bring children with you during ultrasound (echo/ vascular) testing. Due to room size and safety concerns, children are not allowed in the ultrasound rooms during exams. Our front office staff cannot provide observation of children in our lobby area while testing is being conducted. An adult accompanying a patient to their appointment will only be allowed in the ultrasound room at the discretion of the ultrasound technician under special circumstances. We apologize for any inconvenience.  Follow-Up: At [facility], you and your health needs are our priority.  As part of our continuing mission to provide you with exceptional heart care, our providers are all part of one team.  This team includes your primary Cardiologist (physician) and Advanced Practice Providers or APPs (Physician Assistants and Nurse Practitioners) who all work together to provide you with the care you need, when you need it.  Your next appointment:   Already scheduled  We recommend signing up for the patient portal called  MyChart.  Sign up information is provided on this After Visit Summary.  MyChart is used to connect with patients for Virtual Visits (Telemedicine).  Patients are able to view lab/test results, encounter notes, upcoming appointments, etc.  Non-urgent messages can be sent to your provider as well.   To learn more about what you can do with MyChart, go to [email or web address].   Other Instructions [employer] Cardiac Event Monitor Instructions  Your physician has requested you wear your cardiac event monitor for 30 days. [employer] may call or text to confirm a shipping address. The monitor will be sent to a land address via UPS. [employer] will not ship a monitor to a PO BOX. It typically takes 3-5 days to receive your monitor after it has been enrolled. [employer] will assist with USPS tracking if your package is delayed. The telephone number for [employer] is [phone]. Once you have received your monitor, please review the enclosed instructions. Instruction tutorials can also be viewed under help and settings on the enclosed cell phone. Your monitor has already been registered assigning a specific monitor serial # to you.  Billing and Self Pay Discount Information  [employer] has been provided the insurance information we had on file for you.  If your insurance has been updated, please call [employer] at [phone] to provide them with your updated insurance information.   [employer] offers a discounted Self Pay option for patients who have insurance that does not cover their cardiac event monitor or patients without insurance.  The discounted cost of a Self Pay Cardiac Event Monitor would be $225.00 ,  if the patient contacts [employer] at [phone] within 7 days of applying the monitor to make payment arrangements.  If the patient does not contact [employer] within 7 days of applying the monitor, the cost of the cardiac event monitor will be $350.00.  Applying the  monitor  Remove cell phone from case and turn it on. The cell phone works as IT consultant and needs to be within [employer] of you at all times. The cell phone will need to be charged on a daily basis. We recommend you plug the cell phone into the enclosed charger at your bedside table every night.  Monitor batteries: You will receive two monitor batteries labelled #1 and #2. These are your recorders. Plug battery #2 onto the second connection on the enclosed charger. Keep one battery on the charger at all times. This will keep the monitor battery deactivated. It will also keep it fully charged for when you need to switch your monitor batteries. A small light will be blinking on the battery emblem when it is charging. The light on the battery emblem will remain on when the battery is fully charged.  Open package of a Monitor strip. Insert battery #1 into black hood on strip and gently squeeze monitor battery onto connection as indicated in instruction booklet. Set aside while preparing skin.  Choose location for your strip, vertical or horizontal, as indicated in the instruction booklet. Shave to remove all hair from location. There cannot be any lotions, oils, powders, or colognes on skin where monitor is to be applied. Wipe skin clean with enclosed Saline wipe. Dry skin completely.  Peel paper labeled #1 off the back of the Monitor strip exposing the adhesive. Place the monitor on the chest in the vertical or horizontal position shown in the instruction booklet. One arrow on the monitor strip must be pointing upward. Carefully remove paper labeled #2, attaching remainder of strip to your skin. Try not to create any folds or wrinkles in the strip as you apply it.  Firmly press and release the circle in the center of the monitor battery. You will hear a small beep. This is turning the monitor battery on. The heart emblem on the monitor battery will light up every 5 seconds if the monitor  battery in turned on and connected to the patient securely. Do not push and hold the circle down as this turns the monitor battery off. The cell phone will locate the monitor battery. A screen will appear on the cell phone checking the connection of your monitor strip. This may read poor connection initially but change to good connection within the next minute. Once your monitor accepts the connection you will hear a series of 3 beeps followed by a climbing crescendo of beeps. A screen will appear on the cell phone showing the two monitor strip placement options. Touch the picture that demonstrates where you applied the monitor strip.  Your monitor strip and battery are waterproof. You are able to shower, bathe, or swim with the monitor on. They just ask you do not submerge deeper than 3 feet underwater. We recommend removing the monitor if you are swimming in a lake, river, or ocean.  Your monitor battery will need to be switched to a fully charged monitor battery approximately once a week. The cell phone will alert you of an action which needs to be made.  On the cell phone, tap for details to reveal connection status, monitor battery status, and cell phone battery status. The  green dots indicates your monitor is in good status. A red dot indicates there is something that needs your attention.  To record a symptom, click the circle on the monitor battery. In 30-60 seconds a list of symptoms will appear on the cell phone. Select your symptom and tap save. Your monitor will record a sustained or significant arrhythmia regardless of you clicking the button. Some patients do not feel the heart rhythm irregularities. [employer] will notify us  of any serious or critical events.  Refer to instruction booklet for instructions on switching batteries, changing strips, the Do not disturb or Pause features, or any additional questions.  Call [employer] at [phone], to confirm your monitor is  transmitting and record your baseline. They will answer any questions you may have regarding the monitor instructions at that time.  Returning the monitor to [employer]  Place all equipment back into blue box. Peel off strip of paper to expose adhesive and close box securely. There is a prepaid UPS shipping label on this box. Drop in a UPS drop box, or at a UPS facility like [employer]. You may also contact [employer] to arrange UPS to pick up monitor package at your home.

## 2023-09-24 ENCOUNTER — Encounter: Payer: Self-pay | Admitting: Cardiology

## 2023-09-24 ENCOUNTER — Ambulatory Visit: Payer: Self-pay | Admitting: Cardiology

## 2023-09-24 LAB — TSH: TSH: 3.1 u[IU]/mL (ref 0.450–4.500)

## 2023-09-24 LAB — MAGNESIUM: Magnesium: 2.3 mg/dL (ref 1.6–2.3)

## 2023-09-26 ENCOUNTER — Telehealth: Payer: Self-pay | Admitting: Internal Medicine

## 2023-09-26 ENCOUNTER — Telehealth: Payer: Self-pay

## 2023-09-26 DIAGNOSIS — M542 Cervicalgia: Secondary | ICD-10-CM | POA: Diagnosis not present

## 2023-09-26 NOTE — Telephone Encounter (Addendum)
   Cardiac Monitor Alert  Date of alert:  09/26/2023   Patient Name: Rodney Gray  DOB: [birth year]  MRN: [id number]   [facility] Cardiologist: Danelle Birmingham, MD  [facility] EP:  None    Monitor Information: Cardiac Event Monitor [[employer]]  Reason:  Syncope  Ordering provider:  Katlyn West, NP  {  Alert Atrial Fibrillation/Flutter This is the 1st alert for this rhythm.  The patient has a hx of Atrial Fibrillation/Flutter.  The patient is not currently on anticoagulation.  Anticoagulation medication as of 09/26/2023           ELIQUIS  5 MG TABS tablet TAKE 1 TABLET BY MOUTH TWICE  DAILY       Next Cardiology Appointment   Date:  10/10/2023  Provider:  Danelle Birmingham, MD   The patient was contacted today on 2nd attempt.  He is asymptomatic. Arrhythmia, symptoms and history reviewed with Arun Thukanni, MD.  Plan:  No changes, patient to continue anticoagulation meds and keep upcoming appt next month. Patient informed of provider recommendations. Patient verbalized understanding. No questions at this time.  Other: .hc  Rodney JONELLE Ellen, RN  09/26/2023 9:09 AM

## 2023-09-26 NOTE — Telephone Encounter (Signed)
 This has been addressed in another encounter.  Please see that encounter for complete details.

## 2023-09-26 NOTE — Telephone Encounter (Signed)
 Follow Up:      She is calling with results.

## 2023-09-27 ENCOUNTER — Ambulatory Visit (INDEPENDENT_AMBULATORY_CARE_PROVIDER_SITE_OTHER): Admitting: Internal Medicine

## 2023-09-27 ENCOUNTER — Ambulatory Visit (HOSPITAL_COMMUNITY)
Admission: RE | Admit: 2023-09-27 | Discharge: 2023-09-27 | Disposition: A | Discharge status: Home / Self Care | Source: Ambulatory Visit | Attending: Cardiology | Admitting: Cardiology

## 2023-09-27 ENCOUNTER — Encounter: Payer: Self-pay | Admitting: Internal Medicine

## 2023-09-27 VITALS — BP 130/74 | HR 80 | Temp 97.8°F | Ht 68.0 in | Wt 181.0 lb

## 2023-09-27 DIAGNOSIS — S0101XD Laceration without foreign body of scalp, subsequent encounter: Secondary | ICD-10-CM

## 2023-09-27 DIAGNOSIS — R55 Syncope and collapse: Secondary | ICD-10-CM | POA: Insufficient documentation

## 2023-09-27 DIAGNOSIS — S0101XA Laceration without foreign body of scalp, initial encounter: Secondary | ICD-10-CM | POA: Insufficient documentation

## 2023-09-27 NOTE — Assessment & Plan Note (Signed)
 This is likely due to the methocarbamol  Fortunately, he has stopped this I recommend changing the diltiazem  to bedtime as well Has monitor on

## 2023-09-27 NOTE — Progress Notes (Signed)
 Subjective:    Patient ID: Rodney Gray, male    DOB: December 02, 1950, 73 y.o.   MRN: [id number]  HPI Here with wife for ER follow up Needs stitches removed also  Clemens 6/16 Has noticed after eating he will have a momentary pause---dizzy spell for a second Most common after eating breakfast Usually passes after a deep breath That day, he had breakfast--then got up and spell came on. Put hand on wall to get his balance--and thinks he missed the wall and fell Woke on the floor--wife found him on side on floor in blood (from head) Eyes looked fixed--she called 911 Woke in less than a minute and started feeling more normal Had anisocoria in the ambulance CT showed posterior soft tissue injury to periosteum but no intracranial findings. Cervical and facial CTs were okay When getting home---his neck felt tight, so he moved it around and got vertigo with full extension Had Dix-Halpike maneuver at ENT and it seemed to have worked  Has seen cardiology Has 30 day monitor on Carotids being done tomorrow Getting vascular evaluation also from Dr Onetha  Current Outpatient Medications on File Prior to Visit  Medication Sig Dispense Refill   acetaminophen  (TYLENOL ) 650 MG CR tablet Take 650 mg by mouth at bedtime as needed for pain.     ARTIFICIAL TEAR SOLUTION OP Place 1 drop into both eyes 3 (three) times daily.     clindamycin  (CLEOCIN ) 150 MG capsule Take 4 capsules by mouth as needed. Prior to dental appointments     diltiazem  (CARDIZEM  CD) 180 MG 24 hr capsule TAKE 1 CAPSULE BY MOUTH EVERY DAY 90 capsule 2   ELIQUIS  5 MG TABS tablet TAKE 1 TABLET BY MOUTH TWICE  DAILY 180 tablet 3   montelukast  (SINGULAIR ) 10 MG tablet Take 1 tablet (10 mg total) by mouth as needed. Allergies 90 tablet 1   Multiple Vitamins-Minerals (MENS MULTIVITAMIN PLUS PO) Take 1 capsule by mouth daily.     Omega-3 Fatty Acids (FISH OIL) 1200 MG CAPS Take 1 capsule by mouth daily.     omeprazole  (PRILOSEC) 20 MG  capsule TAKE 1 CAPSULE BY MOUTH AT BEDTIME. 90 capsule 3   omeprazole  (PRILOSEC) 40 MG capsule Take 1 capsule (40 mg total) by mouth daily. Take 1 at evening meal (Patient taking differently: Take 20 mg by mouth every other day. Take 1 at evening meal on days with prednisone ) 30 capsule 3   predniSONE  (DELTASONE ) 1 MG tablet Take 1 tablet (1 mg total) by mouth every other day. Along with a 5 mg tab 45 tablet 3   predniSONE  (DELTASONE ) 5 MG tablet Take 1 tablet (5 mg total) by mouth every other day. Wean as directed 45 tablet 3   tadalafil  (CIALIS ) 10 MG tablet Take 1 tablet (10 mg total) by mouth daily. OK to take additional 10mg  boost dose as needed 45 minutes prior to sexual activity 90 tablet 4   No current facility-administered medications on file prior to visit.    Allergies  Allergen Reactions   Ampicillin Hives    Has tolerated cephalosporins    Past Medical History:  Diagnosis Date   Allergic rhinitis due to pollen    Atrial fibrillation (HCC)    A-Fib, 1987, 2003, 2005, EPS RX 2005 (A-Fib reocurred)   Atrial fibrillation (HCC)    Basal cell carcinoma    skin CA- squamous and Basal cell   BPH (benign prostatic hypertrophy)    Cataract    Fibromyalgia  History of kidney stones    kidney stones 2 years ago   Hyperlipemia    no per pt   Hypertension    no per pt   Kidney stone    Osteoarthritis    Polymyalgia rheumatica (HCC)    Squamous cell carcinoma in situ     Past Surgical History:  Procedure Laterality Date   ANTERIOR CERVICAL DECOMP/DISCECTOMY FUSION N/A 12/13/2022   Procedure: Anterior Cervical Decompression Fusion  Cervical five-Cervical six - Cervical six-Cervical seven;  Surgeon: Onetha Kuba, MD;  Location: [facility] OR;  Service: Neurosurgery;  Laterality: N/A;   CAPSULOTOMY Bilateral 08/2007   CATARACT EXTRACTION Bilateral    both eyes   COLONOSCOPY     MENISCUS REPAIR Left 09/2009   left knee---Dr [facility]   NASAL SINUS SURGERY  May 2014   RETINAL TEAR  REPAIR CRYOTHERAPY Right    right 12/06   RETINAL TEAR REPAIR CRYOTHERAPY Right    Retinal tear/bleed- Right 12/06   SQUAMOUS CELL CARCINOMA EXCISION Left 07/2008   left forearm   THUMB ARTHROSCOPY Left 1999    Family History  Problem Relation Age of Onset   Hypertension Father    Cirrhosis Father 26   Alcohol abuse Father    Gallstones Mother    Hyperlipidemia Mother    Esophageal cancer Brother    Asthma Daughter    Coronary artery disease Neg Hx    Diabetes Neg Hx    Cancer Neg Hx        prostate or colon   Colon cancer Neg Hx    Rectal cancer Neg Hx    Stomach cancer Neg Hx     Social History   Socioeconomic History   Marital status: Married    Spouse name: Not on file   Number of children: 3   Years of education: Not on file   Highest education level: Not on file  Occupational History   Occupation: retired Mudlogger: RETIRED  Tobacco Use   Smoking status: Never    Passive exposure: Never   Smokeless tobacco: Never  Vaping Use   Vaping status: Never Used  Substance and Sexual Activity   Alcohol use: Yes    Comment: occ. 3 per month   Drug use: Never   Sexual activity: Yes  Other Topics Concern   Not on file  Social History Narrative   Has living will   Wife is health care POA--alternate is son Redell   Would accept resuscitation attempts   Would accept tube feeds for some time--depending on prognosis   Social Drivers of Health   Financial Resource Strain: Low Risk  (07/07/2023)   Received from [facility]   Overall Financial Resource Strain (CARDIA)    Difficulty of Paying Living Expenses: Not hard at all  Food Insecurity: No Food Insecurity (07/07/2023)   Received from  System   Hunger Vital Sign    Within the past 12 months, you worried that your food would run out before you got the money to buy more.: Never true    Within the past 12 months, the food you bought just didn't last  and you didn't have money to get more.: Never true  Transportation Needs: No Transportation Needs (07/07/2023)   Received from [facility]    In the past 12 months, has lack of transportation kept you from medical appointments or from getting medications?: No  Lack of Transportation (Non-Medical): No  Physical Activity: Not on file  Stress: Not on file  Social Connections: Not on file  Intimate Partner Violence: Not on file   Review of Systems Some pain in jaw since fall--mostly left TMJ Wonders about mild concussion--does have mild symptoms after reading for a while or watching TV Is taking methocarbamol  at night from Dr Cram--then increased to three times a day. Now stopped it     Objective:   Physical Exam Constitutional:      Appearance: Normal appearance.  HENT:     Head:     Comments: V shaped laceration is well healed on occiput Sutures removed  Neurological:     General: No focal deficit present.     Mental Status: He is alert.            Assessment & Plan:

## 2023-09-27 NOTE — Assessment & Plan Note (Signed)
 Well healed  Sutures removed Likely has mild concussion--but symptoms improving

## 2023-09-28 ENCOUNTER — Other Ambulatory Visit: Payer: Self-pay | Admitting: Neurosurgery

## 2023-09-28 DIAGNOSIS — G450 Vertebro-basilar artery syndrome: Secondary | ICD-10-CM

## 2023-09-28 DIAGNOSIS — R42 Dizziness and giddiness: Secondary | ICD-10-CM | POA: Diagnosis not present

## 2023-09-29 NOTE — Telephone Encounter (Signed)
 Called patient advised of below they verbalized understanding.

## 2023-09-29 NOTE — Telephone Encounter (Signed)
-----   Message from Katlyn D West sent at 09/28/2023  7:04 AM EDT ----- Please let [name] know that his carotid dopplers were near normal with minimal plaque in the bilateral carotid arteries. Good results! Follow up as planned.  ----- Message ----- From: Interface, [employer] Sent: 09/27/2023   1:27 PM EDT To: Katlyn D West, NP

## 2023-09-30 ENCOUNTER — Telehealth: Payer: Self-pay | Admitting: Cardiology

## 2023-09-30 ENCOUNTER — Telehealth: Payer: Self-pay

## 2023-09-30 NOTE — Telephone Encounter (Signed)
 Spoke with Dr Cindie (DOD) he defers the question to Dr. Waddell

## 2023-09-30 NOTE — Telephone Encounter (Signed)
 [employer] sent a message back to me..Can you please ask the Provider if we should turn off all three,  Asymptomatic and Symptomatic Severe Tachycardia (Rapid AFIB) and Severe Bradycardia (Slow AFIB)?

## 2023-09-30 NOTE — Telephone Encounter (Signed)
 On-Call Cardiology Received call from [facility] re: abnormal monitor results. AF with slow ventricular response, HR 40s documented overnight. Pt asymptomatic. Pt has known h/o chronic AF and is on OAC w/ eliquis  for CVA prophylaxis. Will notify ordering provider.  Corean Jarome Lunger, MD , [facility] 09/30/23 6:22 AM

## 2023-09-30 NOTE — Telephone Encounter (Addendum)
   Cardiac Monitor Alert  Date of alert:  09/30/2023   Patient Name: Rodney Gray  DOB: 01-26-1951  MRN: [id number]   [facility] Cardiologist: Danelle Birmingham, MD  [facility] EP:  None    Monitor Information: Cardiac Event Monitor [[employer]]  Reason:  SYNCOPE  Ordering provider:  KATLYN WEST, NP    Alert Atrial Fibrillation/Flutter This is the 2nd alert for this rhythm.  The patient has a hx of Atrial Fibrillation The patient is not currently on anticoagulation.  Anticoagulation medication as of 09/30/2023           ELIQUIS  5 MG TABS tablet TAKE 1 TABLET BY MOUTH TWICE  DAILY       Next Cardiology Appointment   Date:  10/10/2023  Provider:  DANELLE BIRMINGHAM, MD   The patient was contacted today.  He is asymptomatic. Arrhythmia, symptoms and history reviewed with CAMERON LAMBERT, MD.  Plan:  CONTINUE TO MONITOR, NO CHANGES TO MEDICATIONS AT THIS TIME, STOP AFIB ALERTS. PER RYAN AT [facility], SECURE EMAIL SENT TO [facility] CARDIAC MONITORING TO UPDATE PATIENT ALERT PARAMETERS.  Patient informed of provider recommendations. No further questions at this time.  Update eff 1:07pm: Per Damien Maid (RN clinical supervisor) parameter change request must be from one of the main fax/printers and as long as you type secure (without quotation marks or other characters) in the subject line, it will send securely.submitted. Request emailed using office printer/fax to [email or web address]    Shirl JONELLE Ellen, RN  09/30/2023 10:41 AM

## 2023-09-30 NOTE — Telephone Encounter (Signed)
 Spoke with monitor tech, [employer]. She submitted the recommendation to [facility] Cardiac Monitoring Website: Per DOD Rona Holts, MD Celia Waddell) please update just this patient's alert parameters to not include ATRIAL FIBRILLATION going forward.

## 2023-10-01 ENCOUNTER — Telehealth: Payer: Self-pay | Admitting: Cardiology

## 2023-10-01 NOTE — Telephone Encounter (Signed)
 [employer] calling to notify  Last evening around 4pm, pt had a 3.7sec pause Last night, 4am had lowest HR 37bpm (atrial fibrillation).   Looking at chart review- pt's EKG on 09/23/23- afib with slow VR Pt was seen on 09/23/23 for syncope and monitor was ordered. He is also on diltiazem  180mg  daily.  Called the patient- he is completely asymptomatic- no dizziness/syncope States he takes diltiazem  for years, and is taking at night time  Recommended he contact us  immediately if he feels any dizziness or syncope or any new symptoms and continue to monitor

## 2023-10-03 ENCOUNTER — Telehealth: Payer: Self-pay

## 2023-10-03 ENCOUNTER — Encounter: Payer: Self-pay | Admitting: Internal Medicine

## 2023-10-03 NOTE — Telephone Encounter (Signed)
   Cardiac Monitor Alert  Date of alert:  10/03/2023   Patient Name: Rodney Gray  DOB: 03-19-1951  MRN: [id number]   [facility] Cardiologist: Danelle Birmingham, MD  [facility] EP:  None    Monitor Information: Cardiac Event Monitor [[employer]]  Reason:  Syncope and collapse Ordering provider:  Danelle Birmingham, MD   Alert Atrial Fibrillation/Flutter There has been multiple alerts for this rhythm.  The patient has a hx of Atrial Fibrillation/Flutter.  The patient is currently on anticoagulation.  Anticoagulation medication as of 10/03/2023           ELIQUIS  5 MG TABS tablet TAKE 1 TABLET BY MOUTH TWICE  DAILY       Next Cardiology Appointment   Date:  10/20/23 Provider:  Danelle Birmingham, MD    Other: Report will be forwarded to provider for review. Last alerts asked for updating parameters to not include Afib.   Rodney LOISE Ferri, RN  10/03/2023 7:58 AM

## 2023-10-05 ENCOUNTER — Ambulatory Visit: Payer: Self-pay | Admitting: Internal Medicine

## 2023-10-05 ENCOUNTER — Ambulatory Visit: Attending: Internal Medicine

## 2023-10-05 DIAGNOSIS — M62830 Muscle spasm of back: Secondary | ICD-10-CM | POA: Diagnosis not present

## 2023-10-05 DIAGNOSIS — M9902 Segmental and somatic dysfunction of thoracic region: Secondary | ICD-10-CM | POA: Diagnosis not present

## 2023-10-05 DIAGNOSIS — M9903 Segmental and somatic dysfunction of lumbar region: Secondary | ICD-10-CM | POA: Diagnosis not present

## 2023-10-05 DIAGNOSIS — I059 Rheumatic mitral valve disease, unspecified: Secondary | ICD-10-CM | POA: Insufficient documentation

## 2023-10-05 DIAGNOSIS — M5136 Other intervertebral disc degeneration, lumbar region with discogenic back pain only: Secondary | ICD-10-CM | POA: Diagnosis not present

## 2023-10-05 LAB — ECHOCARDIOGRAM COMPLETE
AR max vel: 2.33 cm2
AV Area VTI: 2.46 cm2
AV Area mean vel: 2.31 cm2
AV Mean grad: 4 mmHg
AV Peak grad: 7.4 mmHg
Ao pk vel: 1.36 m/s
S' Lateral: 3.69 cm

## 2023-10-07 ENCOUNTER — Telehealth: Payer: Self-pay | Admitting: Cardiology

## 2023-10-07 NOTE — Telephone Encounter (Signed)
   Patient wearing event monitor due to syncope was noted by monitoring company to have had a 4.3 second pause this morning at 5am EST. I called and spoke with patient who confirms no symptoms, believes he was asleep at this time (did get up to go to the bathroom, isn't completely sure at what time). Will continue to follow his monitor.  Artist Pouch, PA-C

## 2023-10-10 ENCOUNTER — Encounter: Payer: Self-pay | Admitting: Internal Medicine

## 2023-10-10 ENCOUNTER — Telehealth: Payer: Self-pay | Admitting: Internal Medicine

## 2023-10-10 ENCOUNTER — Ambulatory Visit: Payer: Self-pay | Admitting: Internal Medicine

## 2023-10-10 DIAGNOSIS — H903 Sensorineural hearing loss, bilateral: Secondary | ICD-10-CM | POA: Diagnosis not present

## 2023-10-10 DIAGNOSIS — R42 Dizziness and giddiness: Secondary | ICD-10-CM | POA: Diagnosis not present

## 2023-10-10 NOTE — Telephone Encounter (Signed)
 Received call from [place] with [employer] reporting urgent monitor alert from 10/07/23 at 12:54 AM.  Heart monitor showed auto triggered event of atrial fibrillation with multiple pauses, longest pause being 3.8 seconds, HR ranging 34-70 bpm for 108 seconds.   Monitor alert already addressed, see phone note from 10/07/23 for details.

## 2023-10-10 NOTE — Telephone Encounter (Signed)
Urgent EKG, call transferred

## 2023-10-10 NOTE — Telephone Encounter (Signed)
   Cardiac Monitor Alert  Date of alert:  10/10/2023   Patient Name: Rodney Gray  DOB: 11-06-1950  MRN: [id number]   [facility] Cardiologist: Danelle Birmingham, MD  [facility] EP:  None    Monitor Information: Cardiac Event Monitor [[employer]]  Reason:  syncope Ordering provider:  Birmingham {  Alert Atrial Fibrillation/Flutter, pauses There have been multiple alert for this rhythm.       7/4 5:03 am -- afib w/ 4.3 sec pause      7/4 5:08 am -- afib w/ multiple pauses, longest 2.5      7/4 12:54 pm -- afib w/ multiple pauses, longest 3.8 The patient has a hx of Atrial Fibrillation/Flutter.  The patient is not currently on anticoagulation.  Anticoagulation medication as of 10/07/2023           ELIQUIS  5 MG TABS tablet TAKE 1 TABLET BY MOUTH TWICE  DAILY       Next Cardiology Appointment   Date:  10/20/2023  Provider:  Birmingham   Arrhythmia, symptoms and history reviewed with DOD, Dr. Michele.  Plan:  continue monitor   Other:   Gretel Maeola CROME, RN  10/10/2023 9:40 AM

## 2023-10-11 DIAGNOSIS — Z872 Personal history of diseases of the skin and subcutaneous tissue: Secondary | ICD-10-CM | POA: Diagnosis not present

## 2023-10-11 DIAGNOSIS — L578 Other skin changes due to chronic exposure to nonionizing radiation: Secondary | ICD-10-CM | POA: Diagnosis not present

## 2023-10-11 DIAGNOSIS — L570 Actinic keratosis: Secondary | ICD-10-CM | POA: Diagnosis not present

## 2023-10-11 DIAGNOSIS — Z859 Personal history of malignant neoplasm, unspecified: Secondary | ICD-10-CM | POA: Diagnosis not present

## 2023-10-12 ENCOUNTER — Encounter: Payer: Self-pay | Admitting: Internal Medicine

## 2023-10-15 ENCOUNTER — Telehealth: Payer: Self-pay | Admitting: Physician Assistant

## 2023-10-15 NOTE — Telephone Encounter (Signed)
 Received call after hours for monitor alerts.   Per [facility]:  Pt has had autotriggered with Afib and multiple pauses, longest 2.8 sec, avg heart rate 39 bpm for 20 sec at 0423.   Chart reviewed, pt with now known Afib with pauses. He is anticoagulated with eliquis .   I reached out to the patient who confirms he was sleeping at the time of autotriggered event, feels well this morning, and is compliant with eliquis .   Jon Garre Hasana Alcorta, PA-C 10/15/2023, 9:39 AM [phone] [facility]

## 2023-10-19 ENCOUNTER — Other Ambulatory Visit (HOSPITAL_COMMUNITY)

## 2023-10-19 ENCOUNTER — Encounter: Payer: Self-pay | Admitting: Urology

## 2023-10-20 ENCOUNTER — Ambulatory Visit: Attending: Internal Medicine | Admitting: Internal Medicine

## 2023-10-20 ENCOUNTER — Encounter: Payer: Self-pay | Admitting: Internal Medicine

## 2023-10-20 VITALS — BP 154/75 | HR 64 | Ht 68.0 in | Wt 185.7 lb

## 2023-10-20 DIAGNOSIS — I4819 Other persistent atrial fibrillation: Secondary | ICD-10-CM | POA: Diagnosis not present

## 2023-10-20 NOTE — Patient Instructions (Addendum)
 Medication Instructions:  Your physician recommends that you continue on your current medications as directed. Please refer to the Current Medication list given to you today.  *If you need a refill on your cardiac medications before your next appointment, please call your pharmacy*  Lab Work: None ordered.  You may go to any [facility] Location for your lab work:  [facility] - [address] ([facility]) - [address] [phone] [address]  [place] - [address]   [place]  - [address]   [place] - [address] - [address] [employer]  - [address] [place] - [address] ([facility]   If you have labs (blood work) drawn today and your tests are completely normal, you will receive your results only by: [employer] (if you have MyChart)  If you have any lab test that is abnormal or we need to change your treatment, we will call you or send a MyChart message to review the results.  Testing/Procedures: None ordered.  Follow-Up: At [facility], you and your health needs are our priority.  As part of our continuing mission to provide you with exceptional heart care, we have created designated Provider Care Teams.  These Care Teams include your primary Cardiologist (physician) and Advanced Practice Providers (APPs -  Physician Assistants and Nurse Practitioners) who all work together to provide you with the care you need, when you need it.   Your next appointment:   To be determined after monitor results  The format for your next appointment:   In Person  Provider:   Danelle Birmingham, MD{or one of the following Advanced Practice Providers on your designated Care Team:   Rodney Gray, [place] Rodney Gray, [place] Rodney Barrack, NP  Note: Remote monitoring is used to monitor your Pacemaker/ ICD from home. This monitoring reduces the number of office  visits required to check your device to one time per year. It allows us  to keep an eye on the functioning of your device to ensure it is working properly.

## 2023-10-21 NOTE — Progress Notes (Signed)
 HPI [name] returns today for ongoing evaluation and management of chronic atrial fib. He is a pleasant 73 yo man who has had rate controlled atrial fib for over 14 years. He is asymptomatic. He does not have palpitations. He has been active and denies chest pain or sob with exertion. He has had problems with a pinched nerve as well as musculoskeletal pain. No edema. His energy level is reduced and he wonders about long Covid or a weakening heart.  Allergies  Allergen Reactions   Ampicillin Hives    Has tolerated cephalosporins     Current Outpatient Medications  Medication Sig Dispense Refill   acetaminophen  (TYLENOL ) 650 MG CR tablet Take 650 mg by mouth at bedtime as needed for pain.     ARTIFICIAL TEAR SOLUTION OP Place 1 drop into both eyes 3 (three) times daily.     clindamycin  (CLEOCIN ) 150 MG capsule Take 4 capsules by mouth as needed. Prior to dental appointments     diltiazem  (CARDIZEM  CD) 180 MG 24 hr capsule TAKE 1 CAPSULE BY MOUTH EVERY DAY 90 capsule 2   ELIQUIS  5 MG TABS tablet TAKE 1 TABLET BY MOUTH TWICE  DAILY 180 tablet 3   montelukast  (SINGULAIR ) 10 MG tablet Take 1 tablet (10 mg total) by mouth as needed. Allergies 90 tablet 1   Multiple Vitamins-Minerals (MENS MULTIVITAMIN PLUS PO) Take 1 capsule by mouth daily.     Omega-3 Fatty Acids (FISH OIL) 1200 MG CAPS Take 1 capsule by mouth daily.     omeprazole  (PRILOSEC) 20 MG capsule TAKE 1 CAPSULE BY MOUTH AT BEDTIME. 90 capsule 3   omeprazole  (PRILOSEC) 40 MG capsule Take 1 capsule (40 mg total) by mouth daily. Take 1 at evening meal (Patient taking differently: Take 20 mg by mouth every other day. Take 1 at evening meal on days with prednisone ) 30 capsule 3   predniSONE  (DELTASONE ) 1 MG tablet Take 1 tablet (1 mg total) by mouth every other day. Along with a 5 mg tab 45 tablet 3   predniSONE  (DELTASONE ) 5 MG tablet Take 1 tablet (5 mg total) by mouth every other day. Wean as directed 45 tablet 3   tadalafil   (CIALIS ) 10 MG tablet Take 1 tablet (10 mg total) by mouth daily. OK to take additional 10mg  boost dose as needed 45 minutes prior to sexual activity 90 tablet 4   No current facility-administered medications for this visit.     Past Medical History:  Diagnosis Date   Allergic rhinitis due to pollen    Atrial fibrillation (HCC)    A-Fib, 1987, 2003, 2005, EPS RX 2005 (A-Fib reocurred)   Atrial fibrillation (HCC)    Basal cell carcinoma    skin CA- squamous and Basal cell   BPH (benign prostatic hypertrophy)    Cataract    Fibromyalgia    History of kidney stones    kidney stones 2 years ago   Hyperlipemia    no per pt   Hypertension    no per pt   Kidney stone    Osteoarthritis    Polymyalgia rheumatica (HCC)    Squamous cell carcinoma in situ     ROS:   All systems reviewed and negative except as noted in the HPI.   Past Surgical History:  Procedure Laterality Date   ANTERIOR CERVICAL DECOMP/DISCECTOMY FUSION N/A 12/13/2022   Procedure: Anterior Cervical Decompression Fusion  Cervical five-Cervical six - Cervical six-Cervical seven;  Surgeon: Rodney Kuba, MD;  Location: [facility] OR;  Service: Neurosurgery;  Laterality: N/A;   CAPSULOTOMY Bilateral 08/2007   CATARACT EXTRACTION Bilateral    both eyes   COLONOSCOPY     MENISCUS REPAIR Left 09/2009   left knee---[facility]   NASAL SINUS SURGERY  May 2014   RETINAL TEAR REPAIR CRYOTHERAPY Right    right 12/06   RETINAL TEAR REPAIR CRYOTHERAPY Right    Retinal tear/bleed- Right 12/06   SQUAMOUS CELL CARCINOMA EXCISION Left 07/2008   left forearm   THUMB ARTHROSCOPY Left 1999     Family History  Problem Relation Age of Onset   Hypertension Father    Cirrhosis Father 34   Alcohol abuse Father    Gallstones Mother    Hyperlipidemia Mother    Esophageal cancer Brother    Asthma Daughter    Coronary artery disease Neg Hx    Diabetes Neg Hx    Cancer Neg Hx        prostate or colon   Colon cancer Neg Hx    Rectal  cancer Neg Hx    Stomach cancer Neg Hx      Social History   Socioeconomic History   Marital status: Married    Spouse name: Not on file   Number of children: 3   Years of education: Not on file   Highest education level: Not on file  Occupational History   Occupation: retired Mudlogger: RETIRED  Tobacco Use   Smoking status: Never    Passive exposure: Never   Smokeless tobacco: Never  Vaping Use   Vaping status: Never Used  Substance and Sexual Activity   Alcohol use: Yes    Comment: occ. 3 per month   Drug use: Never   Sexual activity: Yes  Other Topics Concern   Not on file  Social History Narrative   Has living will   Wife is health care POA--alternate is son Rodney Gray   Would accept resuscitation attempts   Would accept tube feeds for some time--depending on prognosis   Social Drivers of Health   Financial Resource Strain: Low Risk  (07/07/2023)   Received from [facility]   Overall Financial Resource Strain (CARDIA)    Difficulty of Paying Living Expenses: Not hard at all  Food Insecurity: No Food Insecurity (07/07/2023)   Received from [facility]   Hunger Vital Sign    Within the past 12 months, you worried that your food would run out before you got the money to buy more.: Never true    Within the past 12 months, the food you bought just didn't last and you didn't have money to get more.: Never true  Transportation Needs: No Transportation Needs (07/07/2023)   Received from [facility]    In the past 12 months, has lack of transportation kept you from medical appointments or from getting medications?: No    Lack of Transportation (Non-Medical): No  Physical Activity: Not on file  Stress: Not on file  Social Connections: Not on file  Intimate Partner Violence: Not on file     BP (!) 154/75   Pulse 64   Ht 5' 8 (1.727 m)   Wt 185 lb 11.2 oz (84.2 kg)    SpO2 97%   BMI 28.24 kg/m   Physical Exam:  Well appearing NAD HEENT: Unremarkable Neck:  No JVD, no thyromegally Lymphatics:  No adenopathy Back:  No CVA tenderness Lungs:  Clear HEART:  Regular rate rhythm, no murmurs, no rubs, no clicks Abd:  soft, positive bowel sounds, no organomegally, no rebound, no guarding Ext:  2 plus pulses, no edema, no cyanosis, no clubbing Skin:  No rashes no nodules Neuro:  CN II through XII intact, motor grossly intact  EKG  DEVICE  Normal device function.  See PaceArt for details.   Assess/Plan:  1. Chronic atrial fib - his rates are well controlled. His CHADSVASC score is now 2. I have recommended he continue eliquis . His heart monitor shows pauses. He has stopped the metoprolol. He remains on cardizem  as he does have periods of RVR. I suspect he will ultimately require PPM insertion. No urgent indication at this point.    2. HTN - his bp is minimally elevated on cardizem . He will continue this medication.   3. Fatigue - he is more tired. I have recommended he undergo a 2D echo to rule out an afib induced CM.   4. Coags - he has had no bleeding on his systemic OAC. Continue.  5. Dizzy spells - this is due to pauses. If he has any more, he will need a ppm.   Danelle Jaqulyn Chancellor,MD

## 2023-10-24 ENCOUNTER — Ambulatory Visit
Admission: RE | Admit: 2023-10-24 | Discharge: 2023-10-24 | Disposition: A | Discharge status: Home / Self Care | Source: Ambulatory Visit | Attending: Neurosurgery | Admitting: Neurosurgery

## 2023-10-24 DIAGNOSIS — G450 Vertebro-basilar artery syndrome: Secondary | ICD-10-CM | POA: Diagnosis not present

## 2023-10-24 DIAGNOSIS — I700 Atherosclerosis of aorta: Secondary | ICD-10-CM | POA: Diagnosis not present

## 2023-10-24 MED ORDER — IOHEXOL 350 MG/ML SOLN
75.0000 mL | Freq: Once | INTRAVENOUS | Status: AC | PRN
  Administered 2023-10-24: 75 mL via INTRAVENOUS

## 2023-10-25 ENCOUNTER — Telehealth: Payer: Self-pay

## 2023-10-25 NOTE — Telephone Encounter (Signed)
 Received monitor alert from 2:07 AM this morning reporting atrial flutter with 2.3 second pause, HR of 28.  Patient states he was sleeping at this time, denies any symptoms.     Cardiac Monitor Alert  Date of alert:  10/25/2023   Patient Name: Rodney Gray  DOB: 1950-10-05  MRN: [id number]   [facility] Cardiologist: Danelle Birmingham, MD  [facility] EP:  None    Monitor Information: Cardiac Event Monitor [[employer]]  Reason:  Syncope and collapse Ordering provider:  Dr. Birmingham { Alert Atrial Flutter with 2.3 second pause on 10/25/23 at 02:07 AM. There have been multiple alerts for this rhythm.  The patient has a hx of Atrial Fibrillation.  The patient is currently on anticoagulation.  Anticoagulation medication as of 10/25/2023           ELIQUIS  5 MG TABS tablet TAKE 1 TABLET BY MOUTH TWICE  DAILY       Next Cardiology Appointment   Date:  TBD after heart monitor results  Provider:  Dr. Birmingham  The patient was contacted today.  He is asymptomatic. Arrhythmia, symptoms and history reviewed with DOD2 Dr. Jeffrie.  Plan:  Patient sleeping at time of alert. No change, continue to monitor.   Roxie LITTIE Louder, RN  10/25/2023 9:55 AM

## 2023-10-28 ENCOUNTER — Encounter: Payer: Medicare Other | Admitting: Internal Medicine

## 2023-11-01 ENCOUNTER — Other Ambulatory Visit: Payer: Self-pay | Admitting: Internal Medicine

## 2023-11-01 ENCOUNTER — Ambulatory Visit (INDEPENDENT_AMBULATORY_CARE_PROVIDER_SITE_OTHER)

## 2023-11-01 ENCOUNTER — Ambulatory Visit: Payer: Self-pay | Admitting: Internal Medicine

## 2023-11-01 ENCOUNTER — Encounter: Payer: Self-pay | Admitting: Internal Medicine

## 2023-11-01 ENCOUNTER — Ambulatory Visit (INDEPENDENT_AMBULATORY_CARE_PROVIDER_SITE_OTHER): Admitting: Internal Medicine

## 2023-11-01 VITALS — BP 118/64 | HR 48 | Temp 97.9°F | Ht 68.0 in | Wt 182.0 lb

## 2023-11-01 VITALS — BP 118/64 | Temp 97.9°F | Resp 18 | Ht 68.0 in | Wt 182.2 lb

## 2023-11-01 DIAGNOSIS — Z125 Encounter for screening for malignant neoplasm of prostate: Secondary | ICD-10-CM | POA: Diagnosis not present

## 2023-11-01 DIAGNOSIS — Z0000 Encounter for general adult medical examination without abnormal findings: Secondary | ICD-10-CM

## 2023-11-01 DIAGNOSIS — I10 Essential (primary) hypertension: Secondary | ICD-10-CM | POA: Diagnosis not present

## 2023-11-01 DIAGNOSIS — K219 Gastro-esophageal reflux disease without esophagitis: Secondary | ICD-10-CM | POA: Diagnosis not present

## 2023-11-01 DIAGNOSIS — I4819 Other persistent atrial fibrillation: Secondary | ICD-10-CM | POA: Diagnosis not present

## 2023-11-01 DIAGNOSIS — M353 Polymyalgia rheumatica: Secondary | ICD-10-CM | POA: Diagnosis not present

## 2023-11-01 LAB — COMPREHENSIVE METABOLIC PANEL WITH GFR
ALT: 12 U/L (ref 0–53)
AST: 15 U/L (ref 0–37)
Albumin: 4.6 g/dL (ref 3.5–5.2)
Alkaline Phosphatase: 46 U/L (ref 39–117)
BUN: 14 mg/dL (ref 6–23)
CO2: 29 meq/L (ref 19–32)
Calcium: 9.4 mg/dL (ref 8.4–10.5)
Chloride: 102 meq/L (ref 96–112)
Creatinine, Ser: 0.82 mg/dL (ref 0.40–1.50)
GFR: 87.28 mL/min (ref >60.00)
Glucose, Bld: 93 mg/dL (ref 70–99)
Potassium: 4.9 meq/L (ref 3.5–5.1)
Sodium: 139 meq/L (ref 135–145)
Total Bilirubin: 0.9 mg/dL (ref 0.2–1.2)
Total Protein: 6.7 g/dL (ref 6.0–8.3)

## 2023-11-01 LAB — PSA, MEDICARE: PSA: 1.8 ng/mL (ref 0.10–4.00)

## 2023-11-01 LAB — CBC
HCT: 44.5 % (ref 39.0–52.0)
Hemoglobin: 14.7 g/dL (ref 13.0–17.0)
MCHC: 33 g/dL (ref 30.0–36.0)
MCV: 95.1 fl (ref 78.0–100.0)
Platelets: 233 K/uL (ref 150.0–400.0)
RBC: 4.68 Mil/uL (ref 4.22–5.81)
RDW: 13.9 % (ref 11.5–15.5)
WBC: 5.2 K/uL (ref 4.0–10.5)

## 2023-11-01 LAB — LIPID PANEL
Cholesterol: 214 mg/dL — ABNORMAL HIGH (ref 0–200)
HDL: 52.5 mg/dL (ref >39.00)
LDL Cholesterol: 135 mg/dL — ABNORMAL HIGH (ref 0–99)
NonHDL: 161.44
Total CHOL/HDL Ratio: 4
Triglycerides: 134 mg/dL (ref 0.0–149.0)
VLDL: 26.8 mg/dL (ref 0.0–40.0)

## 2023-11-01 LAB — SEDIMENTATION RATE: Sed Rate: 5 mm/h (ref 0–20)

## 2023-11-01 NOTE — Progress Notes (Signed)
 Subjective:   Rodney Gray is a 73 y.o. who presents for a Medicare Wellness preventive visit.  As a reminder, Annual Wellness Visits don't include a physical exam, and some assessments may be limited, especially if this visit is performed virtually. We may recommend an in-person follow-up visit with your provider if needed.  Visit Complete: In person  Persons Participating in Visit: Patient.  AWV Questionnaire: No: Patient Medicare AWV questionnaire was not completed prior to this visit.  Cardiac Risk Factors include: advanced age (>36men, >24 women);hypertension;male gender     Objective:    Today's Vitals   11/01/23 0807  BP: 118/64  Resp: 18  Temp: 97.9 F (36.6 C)  TempSrc: Oral  Weight: 182 lb 3.2 oz (82.6 kg)  Height: 5' 8 (1.727 m)   Body mass index is 27.7 kg/m.     11/01/2023    8:20 AM 09/19/2023    9:22 AM 06/28/2023    7:56 PM 02/04/2023    5:50 PM 12/13/2022    6:24 AM 12/03/2022   10:05 AM 12/02/2020   12:02 PM  Advanced Directives  Does Patient Have a Medical Advance Directive? Yes Yes No No Yes Yes No  Type of Estate agent of [place];Living will Living will   Healthcare Power of [place];Living will Living will;Healthcare Power of Attorney   Does patient want to make changes to medical advance directive?     No - Patient declined    Copy of Healthcare Power of Attorney in Chart? Yes - validated most recent copy scanned in chart (See row information)    No - copy requested No - copy requested   Would patient like information on creating a medical advance directive?   No - Patient declined        Current Medications (verified) Outpatient Encounter Medications as of 11/01/2023  Medication Sig   acetaminophen  (TYLENOL ) 650 MG CR tablet Take 650 mg by mouth at bedtime as needed for pain.   ARTIFICIAL TEAR SOLUTION OP Place 1 drop into both eyes 3 (three) times daily.   clindamycin  (CLEOCIN ) 150 MG capsule Take 4 capsules by  mouth as needed. Prior to dental appointments   diltiazem  (CARDIZEM  CD) 180 MG 24 hr capsule TAKE 1 CAPSULE BY MOUTH EVERY DAY   ELIQUIS  5 MG TABS tablet TAKE 1 TABLET BY MOUTH TWICE  DAILY   montelukast  (SINGULAIR ) 10 MG tablet Take 1 tablet (10 mg total) by mouth as needed. Allergies   Multiple Vitamins-Minerals (MENS MULTIVITAMIN PLUS PO) Take 1 capsule by mouth daily.   Omega-3 Fatty Acids (FISH OIL) 1200 MG CAPS Take 1 capsule by mouth daily.   omeprazole  (PRILOSEC) 40 MG capsule Take 1 capsule (40 mg total) by mouth daily. Take 1 at evening meal (Patient taking differently: Take 20 mg by mouth every other day. Take 1 at evening meal on days with prednisone )   predniSONE  (DELTASONE ) 1 MG tablet Take 1 tablet (1 mg total) by mouth every other day. Along with a 5 mg tab   predniSONE  (DELTASONE ) 5 MG tablet Take 1 tablet (5 mg total) by mouth every other day. Wean as directed   tadalafil  (CIALIS ) 10 MG tablet Take 1 tablet (10 mg total) by mouth daily. OK to take additional 10mg  boost dose as needed 45 minutes prior to sexual activity   [DISCONTINUED] omeprazole  (PRILOSEC) 20 MG capsule TAKE 1 CAPSULE BY MOUTH AT BEDTIME.   No facility-administered encounter medications on file as of 11/01/2023.  Allergies (verified) Ampicillin   History: Past Medical History:  Diagnosis Date   Allergic rhinitis due to pollen    Atrial fibrillation (HCC)    A-Fib, 1987, 2003, 2005, EPS RX 2005 (A-Fib reocurred)   Atrial fibrillation (HCC)    Basal cell carcinoma    skin CA- squamous and Basal cell   BPH (benign prostatic hypertrophy)    Cataract    Fibromyalgia    History of kidney stones    kidney stones 2 years ago   Hyperlipemia    no per pt   Hypertension    no per pt   Kidney stone    Osteoarthritis    Polymyalgia rheumatica (HCC)    Squamous cell carcinoma in situ    Past Surgical History:  Procedure Laterality Date   ANTERIOR CERVICAL DECOMP/DISCECTOMY FUSION N/A 12/13/2022    Procedure: Anterior Cervical Decompression Fusion  Cervical five-Cervical six - Cervical six-Cervical seven;  Surgeon: Onetha Kuba, MD;  Location: [facility] OR;  Service: Neurosurgery;  Laterality: N/A;   CAPSULOTOMY Bilateral 08/2007   CATARACT EXTRACTION Bilateral    both eyes   COLONOSCOPY     MENISCUS REPAIR Left 09/2009   left knee---Dr [facility]   NASAL SINUS SURGERY  May 2014   RETINAL TEAR REPAIR CRYOTHERAPY Right    right 12/06   RETINAL TEAR REPAIR CRYOTHERAPY Right    Retinal tear/bleed- Right 12/06   SQUAMOUS CELL CARCINOMA EXCISION Left 07/2008   left forearm   THUMB ARTHROSCOPY Left 1999   Family History  Problem Relation Age of Onset   Hypertension Father    Cirrhosis Father 73   Alcohol abuse Father    Gallstones Mother    Hyperlipidemia Mother    Esophageal cancer Brother    Asthma Daughter    Coronary artery disease Neg Hx    Diabetes Neg Hx    Cancer Neg Hx        prostate or colon   Colon cancer Neg Hx    Rectal cancer Neg Hx    Stomach cancer Neg Hx    Social History   Socioeconomic History   Marital status: Married    Spouse name: Not on file   Number of children: 3   Years of education: Not on file   Highest education level: Not on file  Occupational History   Occupation: retired Mudlogger: RETIRED  Tobacco Use   Smoking status: Never    Passive exposure: Never   Smokeless tobacco: Never  Vaping Use   Vaping status: Never Used  Substance and Sexual Activity   Alcohol use: Yes    Comment: occ. 3 per month   Drug use: Never   Sexual activity: Yes  Other Topics Concern   Not on file  Social History Narrative   Has living will   Wife is health care POA--alternate is son Redell   Would accept resuscitation attempts   Would accept tube feeds for some time--depending on prognosis   Social Drivers of Health   Financial Resource Strain: Low Risk  (11/01/2023)   Overall Financial Resource Strain (CARDIA)    Difficulty of  Paying Living Expenses: Not hard at all  Food Insecurity: No Food Insecurity (11/01/2023)   Hunger Vital Sign    Worried About Running Out of Food in the Last Year: Never true    Ran Out of Food in the Last Year: Never true  Transportation Needs: No Transportation Needs (11/01/2023)   PRAPARE -  Administrator, Civil Service (Medical): No    Lack of Transportation (Non-Medical): No  Physical Activity: Sufficiently Active (11/01/2023)   Exercise Vital Sign    Days of Exercise per Week: 5 days    Minutes of Exercise per Session: 60 min  Stress: No Stress Concern Present (11/01/2023)   [employer] of Occupational Health - Occupational Stress Questionnaire    Feeling of Stress: Only a little  Social Connections: Moderately Integrated (11/01/2023)   Social Connection and Isolation Panel    Frequency of Communication with Friends and Family: More than three times a week    Frequency of Social Gatherings with Friends and Family: More than three times a week    Attends Religious Services: 1 to 4 times per year    Active Member of [employer] or Organizations: No    Attends Engineer, structural: Never    Marital Status: Married    Tobacco Counseling Counseling given: No    Clinical Intake:  Pre-visit preparation completed: Yes  Pain : No/denies pain     BMI - recorded: 27.7 Nutritional Status: BMI 25 -29 Overweight Nutritional Risks: None Diabetes: No  Lab Results  Component Value Date   HGBA1C 6.2 09/09/2014     How often do you need to have someone help you when you read instructions, pamphlets, or other written materials from your doctor or pharmacy?: 1 - Never  Interpreter Needed?: No  Comments: lives with wife Information entered by :: B.Sharmon Cheramie,LPN   Activities of Daily Living     11/01/2023    8:21 AM 12/03/2022   10:11 AM  In your present state of health, do you have any difficulty performing the following activities:  Hearing? 0   Vision? 0    Difficulty concentrating or making decisions? 0   Walking or climbing stairs? 0   Dressing or bathing? 0   Doing errands, shopping? 0 0  Preparing Food and eating ? N   Using the Toilet? N   In the past six months, have you accidently leaked urine? N   Do you have problems with loss of bowel control? N   Managing your Medications? N   Managing your Finances? N   Housekeeping or managing your Housekeeping? N     Patient Care Team: Jimmy Charlie FERNS, MD as PCP - General (Internal Medicine) Waddell Danelle ORN, MD as PCP - Cardiology (Cardiology) Octavia, Charlie Hamilton, MD as Consulting Physician (Ophthalmology)  I have updated your Care Teams any recent Medical Services you may have received from other providers in the past year.     Assessment:   This is a routine wellness examination for Iasiah.  Hearing/Vision screen Hearing Screening - Comments:: Pt says his hearing is good with hearing aids;ENT annual f/u w/ENT this month done already Vision Screening - Comments:: Pt says his vision is good with glasses Dr Hamilton Octavia   Goals Addressed             This Visit's Progress    Patient Stated       I would like to travel more this year       Depression Screen     11/01/2023    8:17 AM 09/27/2023    9:17 AM 10/27/2022    9:50 AM 10/27/2022    9:15 AM 10/21/2021    8:00 AM 03/11/2021   10:40 AM 10/11/2018    9:42 AM  PHQ 2/9 Scores  PHQ - 2 Score 0 1 1  0 0 0 0    Fall Risk     11/01/2023    8:11 AM 09/27/2023    9:17 AM 10/27/2022    9:50 AM 10/27/2022    9:14 AM 10/21/2021    8:00 AM  Fall Risk   Falls in the past year? 1 1 0 0 0  Number falls in past yr: 0 1  0   Injury with Fall? 1 1  0   Risk for fall due to : No Fall Risks History of fall(s)  No Fall Risks   Follow up Education provided;Falls prevention discussed Falls evaluation completed  Falls evaluation completed     MEDICARE RISK AT HOME:  Medicare Risk at Home Any stairs in or around the home?: Yes If  so, are there any without handrails?: Yes Home free of loose throw rugs in walkways, pet beds, electrical cords, etc?: Yes Adequate lighting in your home to reduce risk of falls?: Yes Life alert?: No Use of a cane, walker or w/c?: No Grab bars in the bathroom?: No Shower chair or bench in shower?: Yes Elevated toilet seat or a handicapped toilet?: Yes  TIMED UP AND GO:  Was the test performed?  Yes  Length of time to ambulate 10 feet: 10 sec Gait steady and fast without use of assistive device  Cognitive Function: 6CIT completed        11/01/2023    8:23 AM  6CIT Screen  What Year? 0 points  What month? 0 points  What time? 0 points  Count back from 20 0 points  Months in reverse 0 points  Repeat phrase 0 points  Total Score 0 points    Immunizations Immunization History  Administered Date(s) Administered   Fluad Quad(high Dose 65+) 01/08/2019, 12/25/2019   H1N1 05/07/2008   Influenza Split 12/30/2010, 01/05/2012   Influenza Whole 01/05/2007, 12/22/2007, 01/09/2009, 12/24/2009   Influenza, High Dose Seasonal PF 12/30/2020, 01/06/2023   Influenza,inj,Quad PF,6+ Mos 12/27/2012, 12/20/2014, 12/10/2015, 12/24/2016, 12/29/2017   Influenza-Unspecified 12/18/2013, 01/04/2022   PFIZER(Purple Top)SARS-COV-2 Vaccination 05/29/2019, 06/19/2019, 01/28/2020   Pfizer Covid-19 Vaccine Bivalent Booster 59yrs & up 01/26/2021   Pfizer(Comirnaty)Fall Seasonal Vaccine 12 years and older 01/04/2022, 01/06/2023   Pneumococcal Conjugate-13 09/12/2015   Pneumococcal Polysaccharide-23 07/04/2005, 09/13/2016   Td 04/06/1997, 09/22/2007   Tdap 11/04/2017, 09/19/2023   Zoster, Live 08/19/2010    Screening Tests Health Maintenance  Topic Date Due   Hepatitis C Screening  Never done   Zoster Vaccines- Shingrix (1 of 2) 07/19/2000   COVID-19 Vaccine (7 - 2024-25 season) 07/07/2023   INFLUENZA VACCINE  11/04/2023   Colonoscopy  05/22/2024   Medicare Annual Wellness (AWV)  10/31/2024    DTaP/Tdap/Td (5 - Td or Tdap) 09/18/2033   Pneumococcal Vaccine: 50+ Years  Completed   Hepatitis B Vaccines  Aged Out   HPV VACCINES  Aged Out   Meningococcal B Vaccine  Aged Out    Health Maintenance  Health Maintenance Due  Topic Date Due   Hepatitis C Screening  Never done   Zoster Vaccines- Shingrix (1 of 2) 07/19/2000   COVID-19 Vaccine (7 - 2024-25 season) 07/07/2023   Health Maintenance Items Addressed: None at  Additional Screening:  Vision Screening: Recommended annual ophthalmology exams for early detection of glaucoma and other disorders of the eye. Would you like a referral to an eye doctor? No    Dental Screening: Recommended annual dental exams for proper oral hygiene  Community Resource Referral / Chronic Care Management: CRR  required this visit?  No   CCM required this visit?  No   Plan:    I have personally reviewed and noted the following in the patient's chart:   Medical and social history Use of alcohol, tobacco or illicit drugs  Current medications and supplements including opioid prescriptions. Patient is not currently taking opioid prescriptions. Functional ability and status Nutritional status Physical activity Advanced directives List of other physicians Hospitalizations, surgeries, and ER visits in previous 12 months Vitals Screenings to include cognitive, depression, and falls Referrals and appointments  In addition, I have reviewed and discussed with patient certain preventive protocols, quality metrics, and best practice recommendations. A written personalized care plan for preventive services as well as general preventive health recommendations were provided to patient.   Erminio LITTIE Saris, LPN   [id number]/[id number]   After Visit Summary: (MyChart) Due to this being a telephonic visit, the after visit summary with patients personalized plan was offered to patient via MyChart   Notes: Please refer to Routing Comments.

## 2023-11-01 NOTE — Progress Notes (Signed)
 Subjective:    Patient ID: Rodney Gray, male    DOB: Apr 25, 1950, 73 y.o.   MRN: [id number]  HPI Here for follow up of chronic health conditions  No recurrence of syncope--after meal pauses are gone now Did see EP cardiologist Now taking diltiazem  after lunch--due to nighttime pauses No chest pain or SOB Some exercise No palpitations---has had racing in the past, but not recently  Taking 6mg  prednisone  every other day This manages the PMR  Did see Dr Onetha for his low back Got steroid burst from him Sciatica is better  Trying to wean the omeprazole  Taking it every other day for a few weeks No dysphagia  Current Outpatient Medications on File Prior to Visit  Medication Sig Dispense Refill   acetaminophen  (TYLENOL ) 650 MG CR tablet Take 650 mg by mouth at bedtime as needed for pain.     ARTIFICIAL TEAR SOLUTION OP Place 1 drop into both eyes 3 (three) times daily.     clindamycin  (CLEOCIN ) 150 MG capsule Take 4 capsules by mouth as needed. Prior to dental appointments     diltiazem  (CARDIZEM  CD) 180 MG 24 hr capsule TAKE 1 CAPSULE BY MOUTH EVERY DAY 90 capsule 2   ELIQUIS  5 MG TABS tablet TAKE 1 TABLET BY MOUTH TWICE  DAILY 180 tablet 3   montelukast  (SINGULAIR ) 10 MG tablet Take 1 tablet (10 mg total) by mouth as needed. Allergies 90 tablet 1   Multiple Vitamins-Minerals (MENS MULTIVITAMIN PLUS PO) Take 1 capsule by mouth daily.     Omega-3 Fatty Acids (FISH OIL) 1200 MG CAPS Take 1 capsule by mouth daily.     predniSONE  (DELTASONE ) 1 MG tablet Take 1 tablet (1 mg total) by mouth every other day. Along with a 5 mg tab 45 tablet 3   predniSONE  (DELTASONE ) 5 MG tablet Take 1 tablet (5 mg total) by mouth every other day. Wean as directed 45 tablet 3   tadalafil  (CIALIS ) 10 MG tablet Take 1 tablet (10 mg total) by mouth daily. OK to take additional 10mg  boost dose as needed 45 minutes prior to sexual activity 90 tablet 4   No current facility-administered medications on  file prior to visit.    Allergies  Allergen Reactions   Ampicillin Hives    Has tolerated cephalosporins    Past Medical History:  Diagnosis Date   Allergic rhinitis due to pollen    Atrial fibrillation (HCC)    A-Fib, 1987, 2003, 2005, EPS RX 2005 (A-Fib reocurred)   Atrial fibrillation (HCC)    Basal cell carcinoma    skin CA- squamous and Basal cell   BPH (benign prostatic hypertrophy)    Cataract    Fibromyalgia    History of kidney stones    kidney stones 2 years ago   Hyperlipemia    no per pt   Hypertension    no per pt   Kidney stone    Osteoarthritis    Polymyalgia rheumatica (HCC)    Squamous cell carcinoma in situ     Past Surgical History:  Procedure Laterality Date   ANTERIOR CERVICAL DECOMP/DISCECTOMY FUSION N/A 12/13/2022   Procedure: Anterior Cervical Decompression Fusion  Cervical five-Cervical six - Cervical six-Cervical seven;  Surgeon: Onetha Kuba, MD;  Location: [facility] OR;  Service: Neurosurgery;  Laterality: N/A;   CAPSULOTOMY Bilateral 08/2007   CATARACT EXTRACTION Bilateral    both eyes   COLONOSCOPY     MENISCUS REPAIR Left 09/2009   left knee---Dr Althia  NASAL SINUS SURGERY  May 2014   RETINAL TEAR REPAIR CRYOTHERAPY Right    right 12/06   RETINAL TEAR REPAIR CRYOTHERAPY Right    Retinal tear/bleed- Right 12/06   SQUAMOUS CELL CARCINOMA EXCISION Left 07/2008   left forearm   THUMB ARTHROSCOPY Left 1999    Family History  Problem Relation Age of Onset   Hypertension Father    Cirrhosis Father 39   Alcohol abuse Father    Gallstones Mother    Hyperlipidemia Mother    Esophageal cancer Brother    Asthma Daughter    Coronary artery disease Neg Hx    Diabetes Neg Hx    Cancer Neg Hx        prostate or colon   Colon cancer Neg Hx    Rectal cancer Neg Hx    Stomach cancer Neg Hx     Social History   Socioeconomic History   Marital status: Married    Spouse name: Not on file   Number of children: 3   Years of education: Not on  file   Highest education level: Not on file  Occupational History   Occupation: retired Mudlogger: RETIRED  Tobacco Use   Smoking status: Never    Passive exposure: Never   Smokeless tobacco: Never  Vaping Use   Vaping status: Never Used  Substance and Sexual Activity   Alcohol use: Yes    Comment: occ. 3 per month   Drug use: Never   Sexual activity: Yes  Other Topics Concern   Not on file  Social History Narrative   Has living will   Wife is health care POA--alternate is son Redell   Would accept resuscitation attempts   Would accept tube feeds for some time--depending on prognosis   Social Drivers of Health   Financial Resource Strain: Low Risk  (11/01/2023)   Overall Financial Resource Strain (CARDIA)    Difficulty of Paying Living Expenses: Not hard at all  Food Insecurity: No Food Insecurity (11/01/2023)   Hunger Vital Sign    Worried About Running Out of Food in the Last Year: Never true    Ran Out of Food in the Last Year: Never true  Transportation Needs: No Transportation Needs (11/01/2023)   PRAPARE - Administrator, Civil Service (Medical): No    Lack of Transportation (Non-Medical): No  Physical Activity: Sufficiently Active (11/01/2023)   Exercise Vital Sign    Days of Exercise per Week: 5 days    Minutes of Exercise per Session: 60 min  Stress: No Stress Concern Present (11/01/2023)   [employer] of Occupational Health - Occupational Stress Questionnaire    Feeling of Stress: Only a little  Social Connections: Moderately Integrated (11/01/2023)   Social Connection and Isolation Panel    Frequency of Communication with Friends and Family: More than three times a week    Frequency of Social Gatherings with Friends and Family: More than three times a week    Attends Religious Services: 1 to 4 times per year    Active Member of [employer] or Organizations: No    Attends Banker Meetings: Never    Marital  Status: Married  Catering manager Violence: Not At Risk (11/01/2023)   Humiliation, Afraid, Rape, and Kick questionnaire    Fear of Current or Ex-Partner: No    Emotionally Abused: No    Physically Abused: No    Sexually Abused: No   Review  of Systems Sleeps fair--but sometimes has trouble getting back to sleep after nocturia (1-2 times) Voids okay in general Appetite is good Weight stable Wears seat belt Teeth are fine Bowels move fine--no blood No other sig joint pains---right shoulder is never great No suspicious skin lesions--does see derm    Objective:   Physical Exam Constitutional:      Appearance: Normal appearance.  HENT:     Mouth/Throat:     Pharynx: No oropharyngeal exudate or posterior oropharyngeal erythema.  Eyes:     Conjunctiva/sclera: Conjunctivae normal.     Pupils: Pupils are equal, round, and reactive to light.  Cardiovascular:     Rate and Rhythm: Normal rate. Rhythm irregular.     Pulses: Normal pulses.     Heart sounds: No murmur heard.    No gallop.  Pulmonary:     Effort: Pulmonary effort is normal.     Breath sounds: Normal breath sounds. No wheezing or rales.  Abdominal:     Palpations: Abdomen is soft.     Tenderness: There is no abdominal tenderness.  Musculoskeletal:     Cervical back: Neck supple.     Right lower leg: No edema.     Left lower leg: No edema.  Lymphadenopathy:     Cervical: No cervical adenopathy.  Skin:    Findings: No lesion or rash.  Neurological:     General: No focal deficit present.     Mental Status: He is alert and oriented to person, place, and time.  Psychiatric:        Mood and Affect: Mood normal.        Behavior: Behavior normal.            Assessment & Plan:

## 2023-11-01 NOTE — Assessment & Plan Note (Signed)
 In remission on prednisone  6 mg every other day Okay to slowly wean from there --but okay to continue

## 2023-11-01 NOTE — Assessment & Plan Note (Signed)
 Rate is okay on diltiazem  180mg  daily Has had some pauses---will likely need pacemaker eventually  Awaiting final report of 30 day monitor On eliquis  5 bid also

## 2023-11-01 NOTE — Assessment & Plan Note (Signed)
 BP Readings from Last 3 Encounters:  11/01/23 118/64  11/01/23 118/64  10/20/23 (!) 154/75   Okay on diltiazem  180

## 2023-11-01 NOTE — Patient Instructions (Signed)
 Rodney Gray , Thank you for taking time out of your busy schedule to complete your Annual Wellness Visit with me. I enjoyed our conversation and look forward to speaking with you again next year. I, as well as your care team,  appreciate your ongoing commitment to your health goals. Please review the following plan we discussed and let me know if I can assist you in the future. Your Game plan/ To Do List     Follow up Visits: Next Medicare AWV with our clinical staff: 11/01/24 @ 8:10am in person   Have you seen your provider in the last 6 months (3 months if uncontrolled diabetes)? Yes Next Office Visit with your provider: 11/01/23  Clinician Recommendations:  Aim for 30 minutes of exercise or brisk walking, 6-8 glasses of water, and 5 servings of fruits and vegetables each day.       This is a list of the screening recommended for you and due dates:  Health Maintenance  Topic Date Due   Hepatitis C Screening  Never done   Zoster (Shingles) Vaccine (1 of 2) 07/19/2000   COVID-19 Vaccine (7 - 2024-25 season) 07/07/2023   Flu Shot  11/04/2023   Colon Cancer Screening  05/22/2024   Medicare Annual Wellness Visit  10/31/2024   DTaP/Tdap/Td vaccine (5 - Td or Tdap) 09/18/2033   Pneumococcal Vaccine for age over 3  Completed   Hepatitis B Vaccine  Aged Out   HPV Vaccine  Aged Out   Meningitis B Vaccine  Aged Out    Advanced directives: (In Chart) A copy of your advanced directives are scanned into your chart should your provider ever need it. Advance Care Planning is important because it:  [x]  Makes sure you receive the medical care that is consistent with your values, goals, and preferences  [x]  It provides guidance to your family and loved ones and reduces their decisional burden about whether or not they are making the right decisions based on your wishes.  Follow the link provided in your after visit summary or read over the paperwork we have mailed to you to help you started  getting your Advance Directives in place. If you need assistance in completing these, please reach out to us  so that we can help you!

## 2023-11-01 NOTE — Assessment & Plan Note (Signed)
 Does okay with omeprazole  20 every other day

## 2023-11-03 DIAGNOSIS — M542 Cervicalgia: Secondary | ICD-10-CM | POA: Diagnosis not present

## 2023-11-03 DIAGNOSIS — Z6828 Body mass index (BMI) 28.0-28.9, adult: Secondary | ICD-10-CM | POA: Diagnosis not present

## 2023-11-06 NOTE — Telephone Encounter (Signed)
 No change in treatment.

## 2023-11-09 DIAGNOSIS — M9903 Segmental and somatic dysfunction of lumbar region: Secondary | ICD-10-CM | POA: Diagnosis not present

## 2023-11-09 DIAGNOSIS — M5136 Other intervertebral disc degeneration, lumbar region with discogenic back pain only: Secondary | ICD-10-CM | POA: Diagnosis not present

## 2023-11-09 DIAGNOSIS — M9902 Segmental and somatic dysfunction of thoracic region: Secondary | ICD-10-CM | POA: Diagnosis not present

## 2023-11-09 DIAGNOSIS — M62830 Muscle spasm of back: Secondary | ICD-10-CM | POA: Diagnosis not present

## 2023-11-10 ENCOUNTER — Other Ambulatory Visit: Payer: Self-pay

## 2023-11-10 DIAGNOSIS — Z01812 Encounter for preprocedural laboratory examination: Secondary | ICD-10-CM

## 2023-11-10 DIAGNOSIS — R55 Syncope and collapse: Secondary | ICD-10-CM

## 2023-11-10 DIAGNOSIS — I059 Rheumatic mitral valve disease, unspecified: Secondary | ICD-10-CM

## 2023-11-10 DIAGNOSIS — R42 Dizziness and giddiness: Secondary | ICD-10-CM

## 2023-11-10 DIAGNOSIS — I4819 Other persistent atrial fibrillation: Secondary | ICD-10-CM

## 2023-11-10 DIAGNOSIS — I10 Essential (primary) hypertension: Secondary | ICD-10-CM

## 2023-11-11 ENCOUNTER — Telehealth: Payer: Self-pay

## 2023-11-11 ENCOUNTER — Other Ambulatory Visit: Payer: Self-pay | Admitting: Internal Medicine

## 2023-11-11 NOTE — Telephone Encounter (Signed)
 Procedure instructions given. Soap at front.

## 2023-11-11 NOTE — Telephone Encounter (Signed)
 Patient stated he will be taking a trip to [place]  8/26 to 9/3 and wants to know if this will be OK this close to his procedure.  Patient stated can leave response in MyChart.

## 2023-11-12 NOTE — Telephone Encounter (Signed)
It will be ok

## 2023-11-21 DIAGNOSIS — I059 Rheumatic mitral valve disease, unspecified: Secondary | ICD-10-CM | POA: Diagnosis not present

## 2023-11-21 DIAGNOSIS — I10 Essential (primary) hypertension: Secondary | ICD-10-CM | POA: Diagnosis not present

## 2023-11-21 DIAGNOSIS — R55 Syncope and collapse: Secondary | ICD-10-CM | POA: Diagnosis not present

## 2023-11-21 DIAGNOSIS — R42 Dizziness and giddiness: Secondary | ICD-10-CM | POA: Diagnosis not present

## 2023-11-21 DIAGNOSIS — Z01812 Encounter for preprocedural laboratory examination: Secondary | ICD-10-CM | POA: Diagnosis not present

## 2023-11-21 DIAGNOSIS — I4819 Other persistent atrial fibrillation: Secondary | ICD-10-CM | POA: Diagnosis not present

## 2023-11-21 LAB — CBC
Hematocrit: 43.8 % (ref 37.5–51.0)
Hemoglobin: 14.5 g/dL (ref 13.0–17.7)
MCH: 31.9 pg (ref 26.6–33.0)
MCHC: 33.1 g/dL (ref 31.5–35.7)
MCV: 97 fL (ref 79–97)
Platelets: 238 x10E3/uL (ref 150–450)
RBC: 4.54 x10E6/uL (ref 4.14–5.80)
RDW: 12.3 % (ref 11.6–15.4)
WBC: 6.9 x10E3/uL (ref 3.4–10.8)

## 2023-11-22 ENCOUNTER — Ambulatory Visit: Payer: Self-pay | Admitting: *Deleted

## 2023-11-22 LAB — BASIC METABOLIC PANEL WITH GFR
BUN/Creatinine Ratio: 19 (ref 10–24)
BUN: 15 mg/dL (ref 8–27)
CO2: 23 mmol/L (ref 20–29)
Calcium: 9.4 mg/dL (ref 8.6–10.2)
Chloride: 101 mmol/L (ref 96–106)
Creatinine, Ser: 0.8 mg/dL (ref 0.76–1.27)
Glucose: 90 mg/dL (ref 70–99)
Potassium: 4.5 mmol/L (ref 3.5–5.2)
Sodium: 138 mmol/L (ref 134–144)
eGFR: 93 mL/min/1.73 (ref >59)

## 2023-11-28 ENCOUNTER — Ambulatory Visit: Admitting: Orthopedic Surgery

## 2023-12-02 ENCOUNTER — Encounter (HOSPITAL_COMMUNITY): Payer: Self-pay

## 2023-12-08 NOTE — Pre-Procedure Instructions (Signed)
 Attempted to call patient regarding procedure instructions.  Left voicemail on the following items: Arrival time 0700 Nothing to eat or drink after midnight No meds AM of procedure Responsible person to drive you home and stay with you for 24 hrs Wash with special soap night before and morning of procedure If on anti-coagulant drug instructions Eliquis - last dose should have been Tuesday 9/2.

## 2023-12-09 ENCOUNTER — Ambulatory Visit (HOSPITAL_COMMUNITY)
Admission: RE | Admit: 2023-12-09 | Discharge: 2023-12-09 | Disposition: A | Discharge status: Home / Self Care | Attending: Internal Medicine | Admitting: Internal Medicine

## 2023-12-09 ENCOUNTER — Other Ambulatory Visit: Payer: Self-pay

## 2023-12-09 ENCOUNTER — Encounter (HOSPITAL_COMMUNITY)
Admission: RE | Disposition: A | Discharge status: Home / Self Care | Payer: Self-pay | Source: Home / Self Care | Attending: Internal Medicine

## 2023-12-09 ENCOUNTER — Ambulatory Visit (HOSPITAL_COMMUNITY)

## 2023-12-09 DIAGNOSIS — R5383 Other fatigue: Secondary | ICD-10-CM | POA: Insufficient documentation

## 2023-12-09 DIAGNOSIS — I442 Atrioventricular block, complete: Secondary | ICD-10-CM | POA: Diagnosis not present

## 2023-12-09 DIAGNOSIS — I482 Chronic atrial fibrillation, unspecified: Secondary | ICD-10-CM | POA: Insufficient documentation

## 2023-12-09 DIAGNOSIS — I700 Atherosclerosis of aorta: Secondary | ICD-10-CM | POA: Diagnosis not present

## 2023-12-09 DIAGNOSIS — R001 Bradycardia, unspecified: Secondary | ICD-10-CM

## 2023-12-09 DIAGNOSIS — Z79899 Other long term (current) drug therapy: Secondary | ICD-10-CM | POA: Diagnosis not present

## 2023-12-09 DIAGNOSIS — I517 Cardiomegaly: Secondary | ICD-10-CM | POA: Diagnosis not present

## 2023-12-09 DIAGNOSIS — Z950 Presence of cardiac pacemaker: Secondary | ICD-10-CM | POA: Diagnosis not present

## 2023-12-09 HISTORY — PX: PACEMAKER IMPLANT: EP1218

## 2023-12-09 SURGERY — PACEMAKER IMPLANT
Anesthesia: LOCAL

## 2023-12-09 MED ORDER — SODIUM CHLORIDE 0.9 % IV SOLN: INTRAVENOUS | Status: DC

## 2023-12-09 MED ORDER — FENTANYL CITRATE (PF) 100 MCG/2ML IJ SOLN
INTRAMUSCULAR | Status: AC
  Filled 2023-12-09: qty 2

## 2023-12-09 MED ORDER — CEFAZOLIN SODIUM-DEXTROSE 1-4 GM/50ML-% IV SOLN
1.0000 g | Freq: Once | INTRAVENOUS | Status: AC
  Administered 2023-12-09: 1 g via INTRAVENOUS
  Filled 2023-12-09: qty 50

## 2023-12-09 MED ORDER — SODIUM CHLORIDE 0.9 % IV SOLN
INTRAVENOUS | Status: DC
Start: 2023-12-09 — End: 2023-12-09
  Filled 2023-12-09: qty 2

## 2023-12-09 MED ORDER — CEFAZOLIN SODIUM-DEXTROSE 2-4 GM/100ML-% IV SOLN: 2.0000 g | INTRAVENOUS | Status: DC

## 2023-12-09 MED ORDER — FENTANYL CITRATE (PF) 100 MCG/2ML IJ SOLN
INTRAMUSCULAR | Status: DC | PRN
  Administered 2023-12-09: 12.5 ug via INTRAVENOUS
  Administered 2023-12-09: 25 ug via INTRAVENOUS
  Administered 2023-12-09: 12.5 ug via INTRAVENOUS

## 2023-12-09 MED ORDER — CEFAZOLIN SODIUM-DEXTROSE 2-4 GM/100ML-% IV SOLN
INTRAVENOUS | Status: AC
  Filled 2023-12-09: qty 100

## 2023-12-09 MED ORDER — CEFAZOLIN SODIUM-DEXTROSE 2-3 GM-%(50ML) IV SOLR
INTRAVENOUS | Status: DC | PRN
  Administered 2023-12-09: 2 g via INTRAVENOUS

## 2023-12-09 MED ORDER — POVIDONE-IODINE 10 % EX SWAB: 2.0000 | Freq: Once | CUTANEOUS | Status: DC

## 2023-12-09 MED ORDER — SODIUM CHLORIDE 0.9 % IV SOLN
INTRAVENOUS | Status: DC | PRN
  Administered 2023-12-09: 80 mg

## 2023-12-09 MED ORDER — ONDANSETRON HCL 4 MG/2ML IJ SOLN: 4.0000 mg | Freq: Four times a day (QID) | INTRAMUSCULAR | Status: DC | PRN

## 2023-12-09 MED ORDER — LIDOCAINE HCL (PF) 1 % IJ SOLN
INTRAMUSCULAR | Status: AC
  Filled 2023-12-09: qty 60

## 2023-12-09 MED ORDER — CHLORHEXIDINE GLUCONATE 4 % EX SOLN
4.0000 | Freq: Once | CUTANEOUS | Status: DC
  Filled 2023-12-09: qty 60

## 2023-12-09 MED ORDER — ACETAMINOPHEN 325 MG PO TABS: 325.0000 mg | ORAL_TABLET | ORAL | Status: DC | PRN

## 2023-12-09 MED ORDER — SODIUM CHLORIDE 0.9 % IV SOLN: 80.0000 mg | INTRAVENOUS | Status: DC

## 2023-12-09 MED ORDER — LIDOCAINE HCL (PF) 1 % IJ SOLN
INTRAMUSCULAR | Status: DC | PRN
Start: 2023-12-09 — End: 2023-12-09
  Administered 2023-12-09: 30 mL

## 2023-12-09 SURGICAL SUPPLY — 8 items
CABLE SURGICAL S-101-97-12 (CABLE) ×2 IMPLANT
GUIDEWIRE ANGLED .035X150CM (WIRE) IMPLANT
LEAD INGEVITY 7842 59 (Lead) IMPLANT
PACEMAKER ACCOLADE SR (Pacemaker) IMPLANT
PAD DEFIB RADIO PHYSIO CONN (PAD) ×2 IMPLANT
SHEATH 9FR PRELUDE SNAP 13 (SHEATH) IMPLANT
TRAY PACEMAKER INSERTION (PACKS) ×2 IMPLANT
WIRE HI TORQ VERSACORE-J 145CM (WIRE) IMPLANT

## 2023-12-09 NOTE — Progress Notes (Signed)
Dr Taylor in and ok to d/c home 

## 2023-12-09 NOTE — Discharge Instructions (Signed)
 After Your Pacemaker   You have a Environmental education officer  If you have a Medtronic or Biotronik device, plug in your home monitor once you get home, and no manual interaction is required.   If you have an [employer] or [employer] device, plug your home monitor once you get home, sit near the device, and press the large activation button. Sit nearby until the process is complete, usually notated by lights on the monitor.   If you were set up for monitoring using an app on your phone, make sure the app remains open in the background and the Bluetooth remains on.  ACTIVITY Do not lift your arm above shoulder height for 1 week after your procedure. After 7 days, you may progress as below.  You should remove your sling 24 hours after your procedure, unless otherwise instructed by your provider.     Friday December 16, 2023  Saturday December 17, 2023 Sunday December 18, 2023 Monday December 19, 2023   Do not lift, push, pull, or carry anything over 10 pounds with the affected arm until 6 weeks (Friday January 20, 2024 ) after your procedure.   You may drive AFTER your wound check, unless you have been told otherwise by your provider.   Ask your healthcare provider when you can go back to work   INCISION/Dressing If you are on a blood thinner such as Coumadin, Xarelto, Eliquis , Plavix, or Pradaxa please confirm with your provider when this should be resumed. 12/14/23  If large square, outer bandage is left in place, this can be removed after 24 hours from your procedure. Do not remove steri-strips or glue as below.   If a PRESSURE DRESSING (a bulky dressing that usually goes up over your shoulder) was applied or left in place, please follow instructions given by your provider on when to return to have this removed.   Monitor your Pacemaker site for redness, swelling, and drainage. Call the device clinic at [phone] if you experience these symptoms or fever/chills.  If your  incision is sealed with Steri-strips or staples, you may shower 7 days after your procedure or when told by your provider. Do not remove the steri-strips or let the shower hit directly on your site. You may wash around your site with soap and water.    If you were discharged in a sling, please do not wear this during the day more than 48 hours after your surgery unless otherwise instructed. This may increase the risk of stiffness and soreness in your shoulder.   Avoid lotions, ointments, or perfumes over your incision until it is well-healed.  You may use a hot tub or a pool AFTER your wound check appointment if the incision is completely closed.  Pacemaker Alerts:  Some alerts are vibratory and others beep. These are NOT emergencies. Please call our office to let us  know. If this occurs at night or on weekends, it can wait until the next business day. Send a remote transmission.  If your device is capable of reading fluid status (for heart failure), you will be offered monthly monitoring to review this with you.   DEVICE MANAGEMENT Remote monitoring is used to monitor your pacemaker from home. This monitoring is scheduled every 91 days by our office. It allows us  to keep an eye on the functioning of your device to ensure it is working properly. You will routinely see your Electrophysiologist annually (more often if necessary).  This will appear as a REMOTE check on  your MyChart schedule. These are automatic and there is nothing for you to manually do unless otherwise instructed.  You should receive your ID card for your new device in 4-8 weeks. Keep this card with you at all times once received. Consider wearing a medical alert bracelet or necklace.  Your Pacemaker may be MRI compatible. This will be discussed at your next office visit/wound check.  You should avoid contact with strong electric or magnetic fields.   Do not use amateur (ham) radio equipment or electric (arc) welding torches. MP3  player headphones with magnets should not be used. Some devices are safe to use if held at least 12 inches (30 cm) from your Pacemaker. These include power tools, lawn mowers, and speakers. If you are unsure if something is safe to use, ask your health care provider.  When using your cell phone, hold it to the ear that is on the opposite side from the Pacemaker. Do not leave your cell phone in a pocket over the Pacemaker.  You may safely use electric blankets, heating pads, computers, and microwave ovens.  Call the office right away if: You have chest pain. You feel more short of breath than you have felt before. You feel more light-headed than you have felt before. Your incision starts to open up.  This information is not intended to replace advice given to you by your health care provider. Make sure you discuss any questions you have with your health care provider.

## 2023-12-09 NOTE — H&P (View-Only) (Signed)
 HPI Rodney Gray returns today for ongoing evaluation and management of chronic atrial fib. He is a pleasant 73 yo man who has had rate controlled atrial fib for over 14 years. He is asymptomatic. He does not have palpitations. He has been active and denies chest pain or sob with exertion. He has had problems with a pinched nerve as well as musculoskeletal pain. No edema. His energy level is reduced and he wonders about long Covid or a weakening heart.  Allergies       Allergies  Allergen Reactions   Ampicillin Hives      Has tolerated cephalosporins                Current Outpatient Medications  Medication Sig Dispense Refill   acetaminophen  (TYLENOL ) 650 MG CR tablet Take 650 mg by mouth at bedtime as needed for pain.       ARTIFICIAL TEAR SOLUTION OP Place 1 drop into both eyes 3 (three) times daily.       clindamycin  (CLEOCIN ) 150 MG capsule Take 4 capsules by mouth as needed. Prior to dental appointments       diltiazem  (CARDIZEM  CD) 180 MG 24 hr capsule TAKE 1 CAPSULE BY MOUTH EVERY DAY 90 capsule 2   ELIQUIS  5 MG TABS tablet TAKE 1 TABLET BY MOUTH TWICE  DAILY 180 tablet 3   montelukast  (SINGULAIR ) 10 MG tablet Take 1 tablet (10 mg total) by mouth as needed. Allergies 90 tablet 1   Multiple Vitamins-Minerals (MENS MULTIVITAMIN PLUS PO) Take 1 capsule by mouth daily.       Omega-3 Fatty Acids (FISH OIL) 1200 MG CAPS Take 1 capsule by mouth daily.       omeprazole  (PRILOSEC) 20 MG capsule TAKE 1 CAPSULE BY MOUTH AT BEDTIME. 90 capsule 3   omeprazole  (PRILOSEC) 40 MG capsule Take 1 capsule (40 mg total) by mouth daily. Take 1 at evening meal (Patient taking differently: Take 20 mg by mouth every other day. Take 1 at evening meal on days with prednisone ) 30 capsule 3   predniSONE  (DELTASONE ) 1 MG tablet Take 1 tablet (1 mg total) by mouth every other day. Along with a 5 mg tab 45 tablet 3   predniSONE  (DELTASONE ) 5 MG tablet Take 1 tablet (5 mg total) by mouth every other  day. Wean as directed 45 tablet 3   tadalafil  (CIALIS ) 10 MG tablet Take 1 tablet (10 mg total) by mouth daily. OK to take additional 10mg  boost dose as needed 45 minutes prior to sexual activity 90 tablet 4      No current facility-administered medications for this visit.              Past Medical History:  Diagnosis Date   Allergic rhinitis due to pollen     Atrial fibrillation (HCC)      A-Fib, 1987, 2003, 2005, EPS RX 2005 (A-Fib reocurred)   Atrial fibrillation (HCC)     Basal cell carcinoma      skin CA- squamous and Basal cell   BPH (benign prostatic hypertrophy)     Cataract     Fibromyalgia     History of kidney stones      kidney stones 2 years ago   Hyperlipemia      no per pt   Hypertension      no per pt   Kidney stone     Osteoarthritis     Polymyalgia rheumatica (HCC)  Squamous cell carcinoma in situ            ROS:    All systems reviewed and negative except as noted in the HPI.          Past Surgical History:  Procedure Laterality Date   ANTERIOR CERVICAL DECOMP/DISCECTOMY FUSION N/A 12/13/2022    Procedure: Anterior Cervical Decompression Fusion  Cervical five-Cervical six - Cervical six-Cervical seven;  Surgeon: Onetha Kuba, MD;  Location: [facility] OR;  Service: Neurosurgery;  Laterality: N/A;   CAPSULOTOMY Bilateral 08/2007   CATARACT EXTRACTION Bilateral      both eyes   COLONOSCOPY       MENISCUS REPAIR Left 09/2009    left knee---[facility]   NASAL SINUS SURGERY   May 2014   RETINAL TEAR REPAIR CRYOTHERAPY Right      right 12/06   RETINAL TEAR REPAIR CRYOTHERAPY Right      Retinal tear/bleed- Right 12/06   SQUAMOUS CELL CARCINOMA EXCISION Left 07/2008    left forearm   THUMB ARTHROSCOPY Left 1999                 Family History  Problem Relation Age of Onset   Hypertension Father     Cirrhosis Father 78   Alcohol abuse Father     Gallstones Mother     Hyperlipidemia Mother     Esophageal cancer Brother     Asthma Daughter      Coronary artery disease Neg Hx     Diabetes Neg Hx     Cancer Neg Hx          prostate or colon   Colon cancer Neg Hx     Rectal cancer Neg Hx     Stomach cancer Neg Hx              Social History         Socioeconomic History   Marital status: Married      Spouse name: Not on file   Number of children: 3   Years of education: Not on file   Highest education level: Not on file  Occupational History   Occupation: retired Tree surgeon: RETIRED  Tobacco Use   Smoking status: Never      Passive exposure: Never   Smokeless tobacco: Never  Vaping Use   Vaping status: Never Used  Substance and Sexual Activity   Alcohol use: Yes      Comment: occ. 3 per month   Drug use: Never   Sexual activity: Yes  Other Topics Concern   Not on file  Social History Narrative    Has living will    Wife is health care POA--alternate is son Redell    Would accept resuscitation attempts    Would accept tube feeds for some time--depending on prognosis    Social Drivers of Health        Financial Resource Strain: Low Risk  (07/07/2023)    Received from [facility]    Overall Financial Resource Strain (CARDIA)     Difficulty of Paying Living Expenses: Not hard at all  Food Insecurity: No Food Insecurity (07/07/2023)    Received from [facility]    Hunger Vital Sign     Within the past 12 months, you worried that your food would run out before you got the money to buy more.: Never true     Within the past 12  months, the food you bought just didn't last and you didn't have money to get more.: Never true  Transportation Needs: No Transportation Needs (07/07/2023)    Received from [facility] - Transportation     In the past 12 months, has lack of transportation kept you from medical appointments or from getting medications?: No     Lack of Transportation (Non-Medical): No  Physical Activity: Not on file   Stress: Not on file  Social Connections: Not on file  Intimate Partner Violence: Not on file        BP (!) 154/75   Pulse 64   Ht 5' 8 (1.727 m)   Wt 185 lb 11.2 oz (84.2 kg)   SpO2 97%   BMI 28.24 kg/m    Physical Exam:   Well appearing NAD HEENT: Unremarkable Neck:  No JVD, no thyromegally Lymphatics:  No adenopathy Back:  No CVA tenderness Lungs:  Clear HEART:  Regular rate rhythm, no murmurs, no rubs, no clicks Abd:  soft, positive bowel sounds, no organomegally, no rebound, no guarding Ext:  2 plus pulses, no edema, no cyanosis, no clubbing Skin:  No rashes no nodules Neuro:  CN II through XII intact, motor grossly intact   EKG   DEVICE  Normal device function.  See PaceArt for details.    Assess/Plan:   1. Chronic atrial fib - his rates are well controlled. His CHADSVASC score is now 2. I have recommended he continue eliquis . His heart monitor shows pauses. He has stopped the metoprolol. He remains on cardizem  as he does have periods of RVR. I suspect he will ultimately require PPM insertion. No urgent indication at this point.    2. HTN - his bp is minimally elevated on cardizem . He will continue this medication.   3. Fatigue - he is more tired. I have recommended he undergo a 2D echo to rule out an afib induced CM.   4. Coags - he has had no bleeding on his systemic OAC. Continue.   5. Dizzy spells - this is due to pauses. If he has any more, he will need a ppm.   Danelle Kammy Klett,MD  Addendum:  The patient has continued to have pauses of over 4 seconds despite stopping his beta blocker. He will undergo PPM insertion. The indications/risks/benefits/goals/expectations of the procedure were reviewed and he wishes to proceed.  Danelle Gloristine Turrubiates,MD

## 2023-12-09 NOTE — H&P (Signed)
 HPI Rodney Gray returns today for ongoing evaluation and management of chronic atrial fib. He is a pleasant 73 yo man who has had rate controlled atrial fib for over 14 years. He is asymptomatic. He does not have palpitations. He has been active and denies chest pain or sob with exertion. He has had problems with a pinched nerve as well as musculoskeletal pain. No edema. His energy level is reduced and he wonders about long Covid or a weakening heart.  Allergies       Allergies  Allergen Reactions   Ampicillin Hives      Has tolerated cephalosporins                Current Outpatient Medications  Medication Sig Dispense Refill   acetaminophen  (TYLENOL ) 650 MG CR tablet Take 650 mg by mouth at bedtime as needed for pain.       ARTIFICIAL TEAR SOLUTION OP Place 1 drop into both eyes 3 (three) times daily.       clindamycin  (CLEOCIN ) 150 MG capsule Take 4 capsules by mouth as needed. Prior to dental appointments       diltiazem  (CARDIZEM  CD) 180 MG 24 hr capsule TAKE 1 CAPSULE BY MOUTH EVERY DAY 90 capsule 2   ELIQUIS  5 MG TABS tablet TAKE 1 TABLET BY MOUTH TWICE  DAILY 180 tablet 3   montelukast  (SINGULAIR ) 10 MG tablet Take 1 tablet (10 mg total) by mouth as needed. Allergies 90 tablet 1   Multiple Vitamins-Minerals (MENS MULTIVITAMIN PLUS PO) Take 1 capsule by mouth daily.       Omega-3 Fatty Acids (FISH OIL) 1200 MG CAPS Take 1 capsule by mouth daily.       omeprazole  (PRILOSEC) 20 MG capsule TAKE 1 CAPSULE BY MOUTH AT BEDTIME. 90 capsule 3   omeprazole  (PRILOSEC) 40 MG capsule Take 1 capsule (40 mg total) by mouth daily. Take 1 at evening meal (Patient taking differently: Take 20 mg by mouth every other day. Take 1 at evening meal on days with prednisone ) 30 capsule 3   predniSONE  (DELTASONE ) 1 MG tablet Take 1 tablet (1 mg total) by mouth every other day. Along with a 5 mg tab 45 tablet 3   predniSONE  (DELTASONE ) 5 MG tablet Take 1 tablet (5 mg total) by mouth every other  day. Wean as directed 45 tablet 3   tadalafil  (CIALIS ) 10 MG tablet Take 1 tablet (10 mg total) by mouth daily. OK to take additional 10mg  boost dose as needed 45 minutes prior to sexual activity 90 tablet 4      No current facility-administered medications for this visit.              Past Medical History:  Diagnosis Date   Allergic rhinitis due to pollen     Atrial fibrillation (HCC)      A-Fib, 1987, 2003, 2005, EPS RX 2005 (A-Fib reocurred)   Atrial fibrillation (HCC)     Basal cell carcinoma      skin CA- squamous and Basal cell   BPH (benign prostatic hypertrophy)     Cataract     Fibromyalgia     History of kidney stones      kidney stones 2 years ago   Hyperlipemia      no per pt   Hypertension      no per pt   Kidney stone     Osteoarthritis     Polymyalgia rheumatica (HCC)  Squamous cell carcinoma in situ            ROS:    All systems reviewed and negative except as noted in the HPI.          Past Surgical History:  Procedure Laterality Date   ANTERIOR CERVICAL DECOMP/DISCECTOMY FUSION N/A 12/13/2022    Procedure: Anterior Cervical Decompression Fusion  Cervical five-Cervical six - Cervical six-Cervical seven;  Surgeon: Onetha Kuba, MD;  Location: [facility] OR;  Service: Neurosurgery;  Laterality: N/A;   CAPSULOTOMY Bilateral 08/2007   CATARACT EXTRACTION Bilateral      both eyes   COLONOSCOPY       MENISCUS REPAIR Left 09/2009    left knee---Dr [facility]   NASAL SINUS SURGERY   May 2014   RETINAL TEAR REPAIR CRYOTHERAPY Right      right 12/06   RETINAL TEAR REPAIR CRYOTHERAPY Right      Retinal tear/bleed- Right 12/06   SQUAMOUS CELL CARCINOMA EXCISION Left 07/2008    left forearm   THUMB ARTHROSCOPY Left 1999                 Family History  Problem Relation Age of Onset   Hypertension Father     Cirrhosis Father 78   Alcohol abuse Father     Gallstones Mother     Hyperlipidemia Mother     Esophageal cancer Brother     Asthma Daughter      Coronary artery disease Neg Hx     Diabetes Neg Hx     Cancer Neg Hx          prostate or colon   Colon cancer Neg Hx     Rectal cancer Neg Hx     Stomach cancer Neg Hx              Social History         Socioeconomic History   Marital status: Married      Spouse name: Not on file   Number of children: 3   Years of education: Not on file   Highest education level: Not on file  Occupational History   Occupation: retired Tree surgeon: RETIRED  Tobacco Use   Smoking status: Never      Passive exposure: Never   Smokeless tobacco: Never  Vaping Use   Vaping status: Never Used  Substance and Sexual Activity   Alcohol use: Yes      Comment: occ. 3 per month   Drug use: Never   Sexual activity: Yes  Other Topics Concern   Not on file  Social History Narrative    Has living will    Wife is health care POA--alternate is son Redell    Would accept resuscitation attempts    Would accept tube feeds for some time--depending on prognosis    Social Drivers of Health        Financial Resource Strain: Low Risk  (07/07/2023)    Received from [facility]    Overall Financial Resource Strain (CARDIA)     Difficulty of Paying Living Expenses: Not hard at all  Food Insecurity: No Food Insecurity (07/07/2023)    Received from [facility]    Hunger Vital Sign     Within the past 12 months, you worried that your food would run out before you got the money to buy more.: Never true     Within the past 12  months, the food you bought just didn't last and you didn't have money to get more.: Never true  Transportation Needs: No Transportation Needs (07/07/2023)    Received from [facility] - Transportation     In the past 12 months, has lack of transportation kept you from medical appointments or from getting medications?: No     Lack of Transportation (Non-Medical): No  Physical Activity: Not on file   Stress: Not on file  Social Connections: Not on file  Intimate Partner Violence: Not on file        BP (!) 154/75   Pulse 64   Ht 5' 8 (1.727 m)   Wt 185 lb 11.2 oz (84.2 kg)   SpO2 97%   BMI 28.24 kg/m    Physical Exam:   Well appearing NAD HEENT: Unremarkable Neck:  No JVD, no thyromegally Lymphatics:  No adenopathy Back:  No CVA tenderness Lungs:  Clear HEART:  Regular rate rhythm, no murmurs, no rubs, no clicks Abd:  soft, positive bowel sounds, no organomegally, no rebound, no guarding Ext:  2 plus pulses, no edema, no cyanosis, no clubbing Skin:  No rashes no nodules Neuro:  CN II through XII intact, motor grossly intact   EKG   DEVICE  Normal device function.  See PaceArt for details.    Assess/Plan:   1. Chronic atrial fib - his rates are well controlled. His CHADSVASC score is now 2. I have recommended he continue eliquis . His heart monitor shows pauses. He has stopped the metoprolol. He remains on cardizem  as he does have periods of RVR. I suspect he will ultimately require PPM insertion. No urgent indication at this point.    2. HTN - his bp is minimally elevated on cardizem . He will continue this medication.   3. Fatigue - he is more tired. I have recommended he undergo a 2D echo to rule out an afib induced CM.   4. Coags - he has had no bleeding on his systemic OAC. Continue.   5. Dizzy spells - this is due to pauses. If he has any more, he will need a ppm.   Danelle Kammy Klett,MD  Addendum:  The patient has continued to have pauses of over 4 seconds despite stopping his beta blocker. He will undergo PPM insertion. The indications/risks/benefits/goals/expectations of the procedure were reviewed and he wishes to proceed.  Danelle Gloristine Turrubiates,MD

## 2023-12-11 ENCOUNTER — Encounter (HOSPITAL_COMMUNITY): Payer: Self-pay | Admitting: Internal Medicine

## 2023-12-14 ENCOUNTER — Ambulatory Visit (INDEPENDENT_AMBULATORY_CARE_PROVIDER_SITE_OTHER): Admitting: Urology

## 2023-12-14 ENCOUNTER — Telehealth: Payer: Self-pay

## 2023-12-14 VITALS — BP 142/67 | HR 57 | Ht 68.0 in | Wt 182.0 lb

## 2023-12-14 DIAGNOSIS — N529 Male erectile dysfunction, unspecified: Secondary | ICD-10-CM | POA: Diagnosis not present

## 2023-12-14 DIAGNOSIS — N200 Calculus of kidney: Secondary | ICD-10-CM | POA: Diagnosis not present

## 2023-12-14 DIAGNOSIS — Z125 Encounter for screening for malignant neoplasm of prostate: Secondary | ICD-10-CM | POA: Diagnosis not present

## 2023-12-14 MED ORDER — TADALAFIL 10 MG PO TABS: 10.0000 mg | ORAL_TABLET | Freq: Every day | ORAL | 4 refills | Status: AC

## 2023-12-14 NOTE — Telephone Encounter (Signed)
 Follow-up after same day discharge: Implant date: 12/09/2023 MD: Danelle Birmingham, MD Device: PPM Location: L chest    Wound check visit: 12/22/2023 90 day MD follow-up: 03/14/2024  Remote Transmission received:yes  Dressing/sling removed: yes  Confirm OAC restart on: yes  Please continue to monitor your cardiac device site for redness, swelling, and drainage. Call the device clinic at [phone] if you experience these symptoms, fever/chills, or have questions about your device.   Remote monitoring is used to monitor your cardiac device from home. This monitoring is scheduled every 91 days by our office. It allows us  to keep an eye on the functioning of your device to ensure it is working properly.

## 2023-12-14 NOTE — Progress Notes (Signed)
   12/14/2023 9:45 AM   Rodney Gray May 05, 1950 [id number]  Reason for visit: Follow up ED, nephrolithiasis, PSA screening  History: Spontaneously passed stone in September 2022, made diet changes and no stone since that time ED controlled with Cialis  10 mg daily and 10 mg boost dose, testosterone  was normal May 2023 at 509  Physical Exam: BP (!) 142/67 (BP Location: Left Arm, Patient Position: Sitting, Cuff Size: Normal)   Pulse (!) 57   Ht 5' 8 (1.727 m)   Wt 182 lb (82.6 kg)   SpO2 99%   BMI 27.67 kg/m   Imaging/labs: KUB September 2024 no evidence of stones PSA normal July 2025 at 1.8  Today: Recently had pacemaker placed No acute urologic complaints Mild urinary urgency if he holds it too long Cialis  working for ED No stone events  Plan:   Cialis  refilled 10 mg daily with 10 mg boost dose as needed Stone prevention strategies reviewed again Reassurance provided regarding normal PSA value, does not need further screening per guideline recommendations RTC 1 year PVR and symptom check, if PCP willing to fill Cialis  can follow-up with urology as needed at that point   Rodney JAYSON Burnet, MD  [facility] [address] [place], [place] [address] [phone]

## 2023-12-15 ENCOUNTER — Ambulatory Visit: Payer: Self-pay | Admitting: Urology

## 2023-12-16 ENCOUNTER — Ambulatory Visit: Attending: Cardiology

## 2023-12-16 ENCOUNTER — Telehealth: Payer: Self-pay

## 2023-12-16 ENCOUNTER — Ambulatory Visit (HOSPITAL_COMMUNITY)
Admission: RE | Admit: 2023-12-16 | Discharge: 2023-12-16 | Disposition: A | Discharge status: Home / Self Care | Source: Ambulatory Visit | Attending: Cardiology | Admitting: Cardiology

## 2023-12-16 DIAGNOSIS — T82120A Displacement of cardiac electrode, initial encounter: Secondary | ICD-10-CM | POA: Diagnosis not present

## 2023-12-16 DIAGNOSIS — Z950 Presence of cardiac pacemaker: Secondary | ICD-10-CM | POA: Diagnosis not present

## 2023-12-16 DIAGNOSIS — T82120D Displacement of cardiac electrode, subsequent encounter: Secondary | ICD-10-CM

## 2023-12-16 NOTE — Telephone Encounter (Signed)
 Alert remote transmission:  Right ventricular automatic threshold detected as > programmed amplitude or suspended Likely undersensing on presenting rhythm   Patient new implant on 12/09/23. Coming to device clinic today to evaluate lead status and programming. Appears RV threshold testing has been suspended.  Will review in clinic and with industry if needed.  Patient is doing well, no symptoms.

## 2023-12-16 NOTE — Progress Notes (Signed)
 Patient in today for evaluation of RV lead sensitivity and threshold alerts.    Device interrogation reveals drop in RV impendance, RV undersensing and RV loss of capture.  Patient is a new single chamber PPM implant on 12/09/23 for sx bradycardia due to intermittent CHB.  He is asymptomatic at present.  Trends were initially stable following implant but in recent days show decline.  Joey, [employer] rep contacted and present to assist with lead testing today.  We were successful in reprogramming sensing unipolar at 1.0; however, RV pacing failed unipolar and bipolar with intermittent loss of capture even at 7.5v@2 .0ms.   Reviewed with Dr. Waddell and CXR ordered and confirmed lead has pulled back. Determination made to program off the RV lead until revision on Monday.  Patient programmed VVI 30 due to pacing wire inactive due to inability to capture.  Patient given instructions to take it easy this weekend. We are planning a lead revision for Monday 12/19/23 at this point. Patient is aware and Dr. Almetta will be contacting him for next steps.

## 2023-12-18 ENCOUNTER — Telehealth: Payer: Self-pay | Admitting: Student in an Organized Health Care Education/Training Program

## 2023-12-18 NOTE — Telephone Encounter (Signed)
 Spoke to [name] on Friday and had him hold apixaban  09/12 PM dose (LD 09/12 AM). Will plan for RV lead revision tomorrow. He will be at admitting at 0630. NPO MN and continue to hold apixaban . Trying to get done as second case with Dr. Cindie.   Rodney DELENA Primus, MD [facility]  Cardiac Electrophysiology

## 2023-12-19 ENCOUNTER — Ambulatory Visit (HOSPITAL_COMMUNITY)
Admission: RE | Admit: 2023-12-19 | Discharge: 2023-12-19 | Disposition: A | Discharge status: Home / Self Care | Source: Ambulatory Visit | Attending: Cardiology | Admitting: Cardiology

## 2023-12-19 ENCOUNTER — Encounter (HOSPITAL_COMMUNITY)
Admission: RE | Disposition: A | Discharge status: Home / Self Care | Payer: Self-pay | Source: Ambulatory Visit | Attending: Cardiology

## 2023-12-19 ENCOUNTER — Ambulatory Visit (HOSPITAL_COMMUNITY)

## 2023-12-19 ENCOUNTER — Other Ambulatory Visit: Payer: Self-pay

## 2023-12-19 DIAGNOSIS — T82120A Displacement of cardiac electrode, initial encounter: Secondary | ICD-10-CM | POA: Diagnosis not present

## 2023-12-19 DIAGNOSIS — Y710 Diagnostic and monitoring cardiovascular devices associated with adverse incidents: Secondary | ICD-10-CM | POA: Insufficient documentation

## 2023-12-19 DIAGNOSIS — Z950 Presence of cardiac pacemaker: Secondary | ICD-10-CM | POA: Diagnosis not present

## 2023-12-19 DIAGNOSIS — T82897A Other specified complication of cardiac prosthetic devices, implants and grafts, initial encounter: Secondary | ICD-10-CM | POA: Diagnosis not present

## 2023-12-19 DIAGNOSIS — Z7901 Long term (current) use of anticoagulants: Secondary | ICD-10-CM | POA: Diagnosis not present

## 2023-12-19 DIAGNOSIS — I482 Chronic atrial fibrillation, unspecified: Secondary | ICD-10-CM | POA: Insufficient documentation

## 2023-12-19 DIAGNOSIS — I10 Essential (primary) hypertension: Secondary | ICD-10-CM | POA: Diagnosis not present

## 2023-12-19 DIAGNOSIS — R5383 Other fatigue: Secondary | ICD-10-CM | POA: Insufficient documentation

## 2023-12-19 DIAGNOSIS — Y838 Other surgical procedures as the cause of abnormal reaction of the patient, or of later complication, without mention of misadventure at the time of the procedure: Secondary | ICD-10-CM | POA: Insufficient documentation

## 2023-12-19 DIAGNOSIS — Z79899 Other long term (current) drug therapy: Secondary | ICD-10-CM | POA: Insufficient documentation

## 2023-12-19 DIAGNOSIS — R001 Bradycardia, unspecified: Secondary | ICD-10-CM | POA: Insufficient documentation

## 2023-12-19 HISTORY — PX: LEAD REVISION/REPAIR: EP1213

## 2023-12-19 LAB — CBC
HCT: 39.9 % (ref 39.0–52.0)
Hemoglobin: 13.7 g/dL (ref 13.0–17.0)
MCH: 32.2 pg (ref 26.0–34.0)
MCHC: 34.3 g/dL (ref 30.0–36.0)
MCV: 93.7 fL (ref 80.0–100.0)
Platelets: 221 K/uL (ref 150–400)
RBC: 4.26 MIL/uL (ref 4.22–5.81)
RDW: 12.4 % (ref 11.5–15.5)
WBC: 5.7 K/uL (ref 4.0–10.5)
nRBC: 0 % (ref 0.0–0.2)

## 2023-12-19 LAB — BASIC METABOLIC PANEL WITH GFR
Anion gap: 10 (ref 5–15)
BUN: 14 mg/dL (ref 8–23)
CO2: 25 mmol/L (ref 22–32)
Calcium: 9.3 mg/dL (ref 8.9–10.3)
Chloride: 104 mmol/L (ref 98–111)
Creatinine, Ser: 0.77 mg/dL (ref 0.61–1.24)
GFR, Estimated: >60 mL/min (ref >60)
Glucose, Bld: 90 mg/dL (ref 70–99)
Potassium: 4.5 mmol/L (ref 3.5–5.1)
Sodium: 139 mmol/L (ref 135–145)

## 2023-12-19 SURGERY — LEAD REVISION/REPAIR
Anesthesia: LOCAL

## 2023-12-19 MED ORDER — SODIUM CHLORIDE 0.9 % IV SOLN
INTRAVENOUS | Status: AC
  Filled 2023-12-19: qty 2

## 2023-12-19 MED ORDER — SODIUM CHLORIDE 0.9 % IV SOLN: INTRAVENOUS | Status: DC

## 2023-12-19 MED ORDER — MIDAZOLAM HCL 5 MG/5ML IJ SOLN
INTRAMUSCULAR | Status: DC | PRN
  Administered 2023-12-19: .5 mg via INTRAVENOUS
  Administered 2023-12-19 (×2): 1 mg via INTRAVENOUS

## 2023-12-19 MED ORDER — SODIUM CHLORIDE 0.9 % IV SOLN: 250.0000 mL | INTRAVENOUS | Status: DC

## 2023-12-19 MED ORDER — POVIDONE-IODINE 10 % EX SWAB
2.0000 | Freq: Once | CUTANEOUS | Status: AC
  Administered 2023-12-19: 2 via TOPICAL

## 2023-12-19 MED ORDER — ACETAMINOPHEN 325 MG PO TABS: 325.0000 mg | ORAL_TABLET | ORAL | Status: DC | PRN

## 2023-12-19 MED ORDER — CEFAZOLIN SODIUM-DEXTROSE 2-4 GM/100ML-% IV SOLN
INTRAVENOUS | Status: AC
  Filled 2023-12-19: qty 100

## 2023-12-19 MED ORDER — HEPARIN (PORCINE) IN NACL 1000-0.9 UT/500ML-% IV SOLN
INTRAVENOUS | Status: DC | PRN
  Administered 2023-12-19: 500 mL

## 2023-12-19 MED ORDER — SODIUM CHLORIDE 0.9% FLUSH: 3.0000 mL | INTRAVENOUS | Status: DC | PRN

## 2023-12-19 MED ORDER — FENTANYL CITRATE (PF) 100 MCG/2ML IJ SOLN
INTRAMUSCULAR | Status: DC | PRN
  Administered 2023-12-19: 25 ug via INTRAVENOUS
  Administered 2023-12-19: 50 ug via INTRAVENOUS

## 2023-12-19 MED ORDER — CEFAZOLIN SODIUM-DEXTROSE 2-4 GM/100ML-% IV SOLN
2.0000 g | INTRAVENOUS | Status: AC
  Administered 2023-12-19: 2 g via INTRAVENOUS

## 2023-12-19 MED ORDER — LIDOCAINE HCL (PF) 1 % IJ SOLN
INTRAMUSCULAR | Status: DC | PRN
  Administered 2023-12-19: 30 mL

## 2023-12-19 MED ORDER — CHLORHEXIDINE GLUCONATE 4 % EX SOLN: 4.0000 | Freq: Once | CUTANEOUS | Status: DC

## 2023-12-19 MED ORDER — MIDAZOLAM HCL 2 MG/2ML IJ SOLN
INTRAMUSCULAR | Status: AC
  Filled 2023-12-19: qty 2

## 2023-12-19 MED ORDER — FENTANYL CITRATE (PF) 100 MCG/2ML IJ SOLN
INTRAMUSCULAR | Status: AC
  Filled 2023-12-19: qty 2

## 2023-12-19 MED ORDER — SODIUM CHLORIDE 0.9 % IV SOLN
80.0000 mg | INTRAVENOUS | Status: AC
  Administered 2023-12-19: 80 mg

## 2023-12-19 MED ORDER — SODIUM CHLORIDE 0.9% FLUSH: 3.0000 mL | Freq: Two times a day (BID) | INTRAVENOUS | Status: DC

## 2023-12-19 MED ORDER — ONDANSETRON HCL 4 MG/2ML IJ SOLN: 4.0000 mg | Freq: Four times a day (QID) | INTRAMUSCULAR | Status: DC | PRN

## 2023-12-19 MED ORDER — LIDOCAINE HCL 1 % IJ SOLN
INTRAMUSCULAR | Status: AC
  Filled 2023-12-19: qty 60

## 2023-12-19 SURGICAL SUPPLY — 11 items
CABLE SURGICAL S-101-97-12 (CABLE) ×2 IMPLANT
CATHETER SSPC NXT 2.5 DELIVRY (CATHETERS) IMPLANT
CATHETER SSPC NXT Y DELIVRY (CATHETERS) IMPLANT
KIT MICROPUNCTURE NIT STIFF (SHEATH) IMPLANT
LEAD INGEVITY 7842 59 (Lead) IMPLANT
PAD DEFIB RADIO PHYSIO CONN (PAD) ×2 IMPLANT
POUCH AIGIS-R ANTIBACT PPM MED (Mesh General) IMPLANT
SHEATH 8FR PRELUDE SNAP 13 (SHEATH) IMPLANT
SHEATH 9FR PRELUDE SNAP 13 (SHEATH) IMPLANT
TRAY PACEMAKER INSERTION (PACKS) ×2 IMPLANT
WIRE HI TORQ VERSACORE-J 145CM (WIRE) IMPLANT

## 2023-12-19 NOTE — Discharge Instructions (Addendum)
 After Your Pacemaker   You have a Environmental education officer  If you have a Medtronic or Biotronik device, plug in your home monitor once you get home, and no manual interaction is required.   If you have an [employer] or [employer] device, plug your home monitor once you get home, sit near the device, and press the large activation button. Sit nearby until the process is complete, usually notated by lights on the monitor.   If you were set up for monitoring using an app on your phone, make sure the app remains open in the background and the Bluetooth remains on.  ACTIVITY Do not lift your arm above shoulder height for 1 week after your procedure. After 7 days, you may progress as below.  You should remove your sling 24 hours after your procedure, unless otherwise instructed by your provider.     Monday December 26, 2023  Tuesday December 27, 2023 Wednesday December 28, 2023 Thursday December 29, 2023   Do not lift, push, pull, or carry anything over 10 pounds with the affected arm until 6 weeks (Monday January 30, 2024 ) after your procedure.   You may drive AFTER your wound check, unless you have been told otherwise by your provider.   Ask your healthcare provider when you can go back to work   INCISION/Dressing If you are on a blood thinner such as Coumadin, Xarelto, Eliquis , Plavix, or Pradaxa please confirm with your provider when this should be resumed.   If large square, outer bandage is left in place, this can be removed after 24 hours from your procedure. Do not remove steri-strips or glue as below.   If a PRESSURE DRESSING (a bulky dressing that usually goes up over your shoulder) was applied or left in place, please follow instructions given by your provider on when to return to have this removed.   Monitor your Pacemaker site for redness, swelling, and drainage. Call the device clinic at [phone] if you experience these symptoms or fever/chills.  If your  incision is sealed with Steri-strips or staples, you may shower 7 days after your procedure or when told by your provider. Do not remove the steri-strips or let the shower hit directly on your site. You may wash around your site with soap and water.    If you were discharged in a sling, please do not wear this during the day more than 48 hours after your surgery unless otherwise instructed. This may increase the risk of stiffness and soreness in your shoulder.   Avoid lotions, ointments, or perfumes over your incision until it is well-healed.  You may use a hot tub or a pool AFTER your wound check appointment if the incision is completely closed.  Pacemaker Alerts:  Some alerts are vibratory and others beep. These are NOT emergencies. Please call our office to let us  know. If this occurs at night or on weekends, it can wait until the next business day. Send a remote transmission.  If your device is capable of reading fluid status (for heart failure), you will be offered monthly monitoring to review this with you.   DEVICE MANAGEMENT Remote monitoring is used to monitor your pacemaker from home. This monitoring is scheduled every 91 days by our office. It allows us  to keep an eye on the functioning of your device to ensure it is working properly. You will routinely see your Electrophysiologist annually (more often if necessary).  This will appear as a REMOTE check on  your MyChart schedule. These are automatic and there is nothing for you to manually do unless otherwise instructed.  You should receive your ID card for your new device in 4-8 weeks. Keep this card with you at all times once received. Consider wearing a medical alert bracelet or necklace.  Your Pacemaker may be MRI compatible. This will be discussed at your next office visit/wound check.  You should avoid contact with strong electric or magnetic fields.   Do not use amateur (ham) radio equipment or electric (arc) welding torches. MP3  player headphones with magnets should not be used. Some devices are safe to use if held at least 12 inches (30 cm) from your Pacemaker. These include power tools, lawn mowers, and speakers. If you are unsure if something is safe to use, ask your health care provider.  When using your cell phone, hold it to the ear that is on the opposite side from the Pacemaker. Do not leave your cell phone in a pocket over the Pacemaker.  You may safely use electric blankets, heating pads, computers, and microwave ovens.  Call the office right away if: You have chest pain. You feel more short of breath than you have felt before. You feel more light-headed than you have felt before. Your incision starts to open up.  This information is not intended to replace advice given to you by your health care provider. Make sure you discuss any questions you have with your health care provider.

## 2023-12-19 NOTE — Interval H&P Note (Signed)
 History and Physical Interval Note:  12/19/2023 3:21 PM  Rodney Gray  has presented today for surgery, with the diagnosis of lead revision.  The various methods of treatment have been discussed with the patient and family. After consideration of risks, benefits and other options for treatment, the patient has consented to  Procedure(s): LEAD REVISION/REPAIR (N/A) as a surgical intervention.  The patient's history has been reviewed, patient examined, no change in status, stable for surgery.  I have reviewed the patient's chart and labs.  Questions were answered to the patient's satisfaction.     Maryelizabeth Eberle [employer]

## 2023-12-19 NOTE — Progress Notes (Signed)
 Dr. Almetta came to see the patient at 1840. Per Dr. Almetta patient is ok to discharge. He has reviewed the chest xray and patient is ok to go home. Joey from [employer] has came to see the patient before discharge.

## 2023-12-20 ENCOUNTER — Encounter (HOSPITAL_COMMUNITY): Payer: Self-pay | Admitting: Cardiology

## 2023-12-20 MED FILL — Midazolam HCl Inj 2 MG/2ML (Base Equivalent): INTRAMUSCULAR | Qty: 2.5 | Status: AC

## 2023-12-21 ENCOUNTER — Telehealth: Payer: Self-pay

## 2023-12-21 LAB — CUP PACEART INCLINIC DEVICE CHECK
Date Time Interrogation Session: 20250912143031
Implantable Lead Connection Status: 753985
Implantable Lead Connection Status: 753985
Implantable Lead Implant Date: 20250905
Implantable Lead Implant Date: 20250905
Implantable Lead Location: 753860
Implantable Lead Location: 753860
Implantable Lead Model: 7842
Implantable Lead Model: 7842
Implantable Lead Serial Number: 1504582
Implantable Lead Serial Number: 1509508
Implantable Pulse Generator Implant Date: 20250905
Pulse Gen Serial Number: 944053

## 2023-12-21 NOTE — Telephone Encounter (Signed)
 Follow-up after same day discharge: Implant date: 12/09/2023 MD: MCA Device: PPM  Location: L CHEST   Wound check visit: 9/18 90 day MD follow-up: 12/10  Remote Transmission received:YES  Dressing/sling removed: N/A  Confirm OAC restart on: YES  Please continue to monitor your cardiac device site for redness, swelling, and drainage. Call the device clinic at [phone] if you experience these symptoms, fever/chills, or have questions about your device.   Remote monitoring is used to monitor your cardiac device from home. This monitoring is scheduled every 91 days by our office. It allows us  to keep an eye on the functioning of your device to ensure it is working properly.

## 2023-12-22 ENCOUNTER — Ambulatory Visit

## 2023-12-23 ENCOUNTER — Ambulatory Visit: Attending: Cardiology

## 2023-12-23 ENCOUNTER — Telehealth: Payer: Self-pay | Admitting: Internal Medicine

## 2023-12-23 DIAGNOSIS — I442 Atrioventricular block, complete: Secondary | ICD-10-CM

## 2023-12-23 NOTE — Progress Notes (Signed)
 Wound check add on appointment-  the patient called the office after hours yesterday evening with reports of increased swelling around his PPM implant site and just distal to the site.   The patient underwent an initial PPM implant on 12/09/23 with Dr. Waddell and an RV lead revision on 12/19/23 with Dr. Francesca Dr. Almetta. An antimicrobial pouch was placed in the pocket as well.   Upon presentation to the office today, the patient has minimal bruising at the implant site and just distal.  He reports increased swelling distal to the incision yesterday with pain radiating to his back. The patient states his swelling is improved today.   Rodney Gray restarted his Eliquis  yesterday.  He currently has an expected amount of swelling at the implant site and very minimal to no swelling distally.  Steri Strips are clean, dry, and intact.   Dr. Almetta in to reassess the patient's implant site and felt he was progressing as expected with no concerns for bleeding in the pocket- he advised the patient to continue his Eliquis .   Reassurance provided to the patient and his wife.  The patient is scheduled to come back to the office on 01/03/24 for his 10-14 day wound check with the Device Clinic.  He is advised to call back to the office with any further concerns in the interim and voices understanding.

## 2023-12-23 NOTE — Telephone Encounter (Signed)
 Voice mail message left by the patient after hours stating that his PPM site is swollen,, much more so than his initial implant on 12/09/23. The patient is s/p RV lead revision on 12/19/23 with Dr. Francesca Dr. Almetta.   Called and spoke with the patient.  Confirmed he restarted his Eliquis  yesterday morning.  His states his implant site was swollen some before that, but is much more noticeable this time. He does think his site might be slightly improved this morning.   Advised the patient that we should evaluate his site in office today just to be sure no other intervention is needed at this time.  The patient voices understanding and is agreeable.  He is scheduled to be seen in office at 10:00 am today.

## 2023-12-23 NOTE — Patient Instructions (Signed)
 Follow up as scheduled.

## 2024-01-03 ENCOUNTER — Ambulatory Visit: Attending: Cardiology

## 2024-01-03 DIAGNOSIS — I4819 Other persistent atrial fibrillation: Secondary | ICD-10-CM | POA: Diagnosis not present

## 2024-01-03 LAB — CUP PACEART INCLINIC DEVICE CHECK
Date Time Interrogation Session: 20250930092913
Implantable Lead Connection Status: 753985
Implantable Lead Implant Date: 20250915
Implantable Lead Location: 753860
Implantable Lead Model: 7842
Implantable Lead Serial Number: 1509508
Implantable Pulse Generator Implant Date: 20250905
Lead Channel Impedance Value: 757 Ohm
Lead Channel Pacing Threshold Amplitude: 0.7 V
Lead Channel Pacing Threshold Pulse Width: 0.5 ms
Lead Channel Sensing Intrinsic Amplitude: 25 mV
Lead Channel Setting Pacing Amplitude: 3.5 V
Lead Channel Setting Pacing Pulse Width: 0.5 ms
Lead Channel Setting Sensing Sensitivity: 2.5 mV
Pulse Gen Serial Number: 944053
Zone Setting Status: 755011

## 2024-01-03 NOTE — Patient Instructions (Signed)

## 2024-01-03 NOTE — Progress Notes (Signed)
 Normal single chamber pacemaker wound check. Presenting rhythm: VS 60. Wound well healed. Routine testing performed. Thresholds, sensing, and impedance consistent with implant measurements and at 3.5V safety margin/auto capture until 3 month visit. No episodes. Reviewed arm restrictions to continue for 6 weeks total post op.  Pt enrolled in remote follow-up.

## 2024-01-04 ENCOUNTER — Encounter: Payer: Self-pay | Admitting: Gastroenterology

## 2024-01-04 DIAGNOSIS — M9903 Segmental and somatic dysfunction of lumbar region: Secondary | ICD-10-CM | POA: Diagnosis not present

## 2024-01-04 DIAGNOSIS — M9902 Segmental and somatic dysfunction of thoracic region: Secondary | ICD-10-CM | POA: Diagnosis not present

## 2024-01-04 DIAGNOSIS — M62830 Muscle spasm of back: Secondary | ICD-10-CM | POA: Diagnosis not present

## 2024-01-04 DIAGNOSIS — M5136 Other intervertebral disc degeneration, lumbar region with discogenic back pain only: Secondary | ICD-10-CM | POA: Diagnosis not present

## 2024-01-04 MED ORDER — OMEPRAZOLE 20 MG PO CPDR: 20.0000 mg | DELAYED_RELEASE_CAPSULE | ORAL | 3 refills | Status: AC

## 2024-01-04 NOTE — Telephone Encounter (Signed)
 Pt reported in my chart message that he takes Omeprazole  20 mg every other day. Verified with provider how Rx should be sent since pt reports every other day. Provider approved refills to be sent as pt reports he takes it.

## 2024-01-04 NOTE — Telephone Encounter (Signed)
 Refill the omeprazole  at 20 mg twice daily, please have him come see me next available follow-up clinic visit so we can discuss the long-term management  HD

## 2024-01-05 ENCOUNTER — Encounter: Payer: Self-pay | Admitting: Internal Medicine

## 2024-01-06 DIAGNOSIS — Z23 Encounter for immunization: Secondary | ICD-10-CM | POA: Diagnosis not present

## 2024-01-16 DIAGNOSIS — Z961 Presence of intraocular lens: Secondary | ICD-10-CM | POA: Diagnosis not present

## 2024-01-16 DIAGNOSIS — H35372 Puckering of macula, left eye: Secondary | ICD-10-CM | POA: Diagnosis not present

## 2024-01-16 DIAGNOSIS — H33301 Unspecified retinal break, right eye: Secondary | ICD-10-CM | POA: Diagnosis not present

## 2024-01-16 DIAGNOSIS — H04123 Dry eye syndrome of bilateral lacrimal glands: Secondary | ICD-10-CM | POA: Diagnosis not present

## 2024-01-16 DIAGNOSIS — H40013 Open angle with borderline findings, low risk, bilateral: Secondary | ICD-10-CM | POA: Diagnosis not present

## 2024-01-24 ENCOUNTER — Ambulatory Visit

## 2024-01-24 DIAGNOSIS — I4819 Other persistent atrial fibrillation: Secondary | ICD-10-CM

## 2024-01-26 ENCOUNTER — Ambulatory Visit: Payer: Self-pay | Admitting: Internal Medicine

## 2024-01-26 ENCOUNTER — Telehealth: Payer: Self-pay

## 2024-01-26 LAB — CUP PACEART REMOTE DEVICE CHECK
Battery Remaining Longevity: 120 mo
Battery Remaining Percentage: 100 %
Brady Statistic RV Percent Paced: 23 %
Date Time Interrogation Session: 20251021200000
Implantable Lead Connection Status: 753985
Implantable Lead Implant Date: 20250915
Implantable Lead Location: 753860
Implantable Lead Model: 7842
Implantable Lead Serial Number: 1509508
Implantable Pulse Generator Implant Date: 20250905
Lead Channel Impedance Value: 819 Ohm
Lead Channel Setting Pacing Amplitude: 3.5 V
Lead Channel Setting Pacing Pulse Width: 0.5 ms
Lead Channel Setting Sensing Sensitivity: 2.5 mV
Pulse Gen Serial Number: 944053
Zone Setting Status: 755011

## 2024-01-26 NOTE — Telephone Encounter (Addendum)
 Pacemaker: Scheduled remote reviewed. Normal device function.  Presenting rhythm: irregular VS VHRs, irregular, >20 beats, V-rates 158-195 bpm,  sent to triage. 12/19/2023 noise was lead revision at that time   Appears all VHR events are possibly AF w/RVR.  Longest duration appears to be 1 minute 3 seconds.  All events with similar morphology as presenting and irregular r-r.  Dr. Waddell has reviewed (see 10/23 - remote device ck result notes in EPIC).

## 2024-01-27 NOTE — Progress Notes (Signed)
 Remote PPM Transmission

## 2024-02-01 DIAGNOSIS — M62830 Muscle spasm of back: Secondary | ICD-10-CM | POA: Diagnosis not present

## 2024-02-01 DIAGNOSIS — M9903 Segmental and somatic dysfunction of lumbar region: Secondary | ICD-10-CM | POA: Diagnosis not present

## 2024-02-01 DIAGNOSIS — M5136 Other intervertebral disc degeneration, lumbar region with discogenic back pain only: Secondary | ICD-10-CM | POA: Diagnosis not present

## 2024-02-01 DIAGNOSIS — M9902 Segmental and somatic dysfunction of thoracic region: Secondary | ICD-10-CM | POA: Diagnosis not present

## 2024-02-02 ENCOUNTER — Encounter: Payer: Self-pay | Admitting: Internal Medicine

## 2024-02-06 ENCOUNTER — Ambulatory Visit (INDEPENDENT_AMBULATORY_CARE_PROVIDER_SITE_OTHER): Admitting: Orthopedic Surgery

## 2024-02-06 ENCOUNTER — Encounter: Payer: Self-pay | Admitting: Radiology

## 2024-02-06 ENCOUNTER — Other Ambulatory Visit: Payer: Self-pay

## 2024-02-06 ENCOUNTER — Encounter: Payer: Self-pay | Admitting: Internal Medicine

## 2024-02-06 DIAGNOSIS — M25511 Pain in right shoulder: Secondary | ICD-10-CM

## 2024-02-06 DIAGNOSIS — G8929 Other chronic pain: Secondary | ICD-10-CM

## 2024-02-07 NOTE — Progress Notes (Unsigned)
 Office Visit Note   Patient: Rodney Gray           Date of Birth: 1951-01-05           MRN: [id number] Visit Date: 02/06/2024 Requested by: Jimmy Charlie FERNS, MD No address on file PCP: Avelina Greig BRAVO, MD  Subjective: Chief Complaint  Patient presents with   Right Shoulder - Follow-up    HPI: Rodney Gray is a 73 y.o. male who presents to the office reporting right shoulder pain.  Since he was last seen has had some issues with his heart.  He has a new pacemaker.  He is on Eliquis .  He did have an MRI scan done of the right shoulder last year which did not show any discrete soft tissue mass or bone mass.  Still has pain localizing to the trapezial region..                ROS: All systems reviewed are negative as they relate to the chief complaint within the history of present illness.  Patient denies fevers or chills.  Assessment & Plan: Visit Diagnoses:  1. Chronic right shoulder pain     Plan: Impression is soft tissue fullness with no obvious bony mass or soft tissue mass based on exam and radiographs.  He does have a slight prominence of that scapular spine to palpation but I do not think it really reaches the level of him wanting to do any type of risky intervention which may or may not help his situation.  We may try an injection as the next step but he will think about that and follow-up as needed  Follow-Up Instructions: No follow-ups on file.   Orders:  Orders Placed This Encounter  Procedures   XR Shoulder Right   No orders of the defined types were placed in this encounter.     Procedures: No procedures performed   Clinical Data: No additional findings.  Objective: Vital Signs: There were no vitals taken for this visit.  Physical Exam:  Constitutional: Patient appears well-developed HEENT:  Head: Normocephalic Eyes:EOM are normal Neck: Normal range of motion Cardiovascular: Normal rate Pulmonary/chest: Effort normal Neurologic:  Patient is alert Skin: Skin is warm Psychiatric: Patient has normal mood and affect  Ortho Exam: Ortho exam demonstrates full active and passive range of motion of the right shoulder with no rotator cuff weakness.  Does have a little bit of pain and fullness in that trapezial region around that scapular spine.  No scapulothoracic crepitus is present.  Motor or sensory function of the hand is intact.  Cervical spine range of motion full.  Specialty Comments:  No specialty comments available.  Imaging: No results found.   PMFS History: Patient Active Problem List   Diagnosis Date Noted   Syncope 09/27/2023   Vertigo 06/27/2023   Right shoulder pain 12/23/2022   Spinal stenosis in cervical region 12/13/2022   Gastroesophageal reflux disease 10/27/2022   Neck pain 08/05/2022   Preventative health care 10/21/2021   Hypertension 09/21/2021   Hemorrhoid prolapse 12/25/2019   Polymyalgia rheumatica 08/01/2017   Advance directive discussed with patient 09/12/2015   BPH with obstruction/lower urinary tract symptoms    Allergic rhinitis due to pollen    Atrial fibrillation (HCC) 05/12/2009   Osteoarthritis 09/13/2006   Past Medical History:  Diagnosis Date   Allergic rhinitis due to pollen    Atrial fibrillation (HCC)    A-Fib, 1987, 2003, 2005, EPS RX 2005 (A-Fib reocurred)  Atrial fibrillation (HCC)    Basal cell carcinoma    skin CA- squamous and Basal cell   BPH (benign prostatic hypertrophy)    Cataract    Fibromyalgia    History of kidney stones    kidney stones 2 years ago   Hyperlipemia    no per pt   Hypertension    no per pt   Kidney stone    Osteoarthritis    Polymyalgia rheumatica    Squamous cell carcinoma in situ     Family History  Problem Relation Age of Onset   Hypertension Father    Cirrhosis Father 60   Alcohol abuse Father    Gallstones Mother    Hyperlipidemia Mother    Esophageal cancer Brother    Asthma Daughter    Coronary artery disease  Neg Hx    Diabetes Neg Hx    Cancer Neg Hx        prostate or colon   Colon cancer Neg Hx    Rectal cancer Neg Hx    Stomach cancer Neg Hx     Past Surgical History:  Procedure Laterality Date   ANTERIOR CERVICAL DECOMP/DISCECTOMY FUSION N/A 12/13/2022   Procedure: Anterior Cervical Decompression Fusion  Cervical five-Cervical six - Cervical six-Cervical seven;  Surgeon: Onetha Kuba, MD;  [facility];  Service: Neurosurgery;  Laterality: N/A;   CAPSULOTOMY Bilateral 08/2007   CATARACT EXTRACTION Bilateral    both eyes   COLONOSCOPY     LEAD REVISION/REPAIR N/A 12/19/2023   Procedure: LEAD REVISION/REPAIR;  Surgeon: Inocencio Soyla Lunger, MD;  Location: [facility] INVASIVE CV LAB;  Service: Cardiovascular;  Laterality: N/A;   MENISCUS REPAIR Left 09/2009   left knee---[facility]   NASAL SINUS SURGERY  May 2014   PACEMAKER IMPLANT N/A 12/09/2023   Procedure: PACEMAKER IMPLANT;  Surgeon: Waddell Danelle ORN, MD;  Location: [facility] INVASIVE CV LAB;  Service: Cardiovascular;  Laterality: N/A;   RETINAL TEAR REPAIR CRYOTHERAPY Right    right 12/06   RETINAL TEAR REPAIR CRYOTHERAPY Right    Retinal tear/bleed- Right 12/06   SQUAMOUS CELL CARCINOMA EXCISION Left 07/2008   left forearm   THUMB ARTHROSCOPY Left 1999   Social History   Occupational History   Occupation: retired [employer]: RETIRED  Tobacco Use   Smoking status: Never    Passive exposure: Never   Smokeless tobacco: Never  Vaping Use   Vaping status: Never Used  Substance and Sexual Activity   Alcohol use: Yes    Comment: occ. 3 per month   Drug use: Never   Sexual activity: Yes

## 2024-02-08 ENCOUNTER — Encounter: Payer: Self-pay | Admitting: Orthopedic Surgery

## 2024-02-24 ENCOUNTER — Encounter: Payer: Self-pay | Admitting: Gastroenterology

## 2024-02-24 ENCOUNTER — Ambulatory Visit: Admitting: Gastroenterology

## 2024-02-24 ENCOUNTER — Ambulatory Visit: Payer: Self-pay | Admitting: Gastroenterology

## 2024-02-24 ENCOUNTER — Other Ambulatory Visit (INDEPENDENT_AMBULATORY_CARE_PROVIDER_SITE_OTHER)

## 2024-02-24 VITALS — BP 140/80 | HR 66 | Ht 67.5 in | Wt 188.5 lb

## 2024-02-24 DIAGNOSIS — K2100 Gastro-esophageal reflux disease with esophagitis, without bleeding: Secondary | ICD-10-CM | POA: Diagnosis not present

## 2024-02-24 DIAGNOSIS — D509 Iron deficiency anemia, unspecified: Secondary | ICD-10-CM

## 2024-02-24 LAB — IBC + FERRITIN
Ferritin: 58.8 ng/mL (ref 22.0–322.0)
Iron: 104 ug/dL (ref 42–165)
Saturation Ratios: 28.7 % (ref 20.0–50.0)
TIBC: 362.6 ug/dL (ref 250.0–450.0)
Transferrin: 259 mg/dL (ref 212.0–360.0)

## 2024-02-24 LAB — VITAMIN B12: Vitamin B-12: 639 pg/mL (ref 211–911)

## 2024-02-24 LAB — MAGNESIUM: Magnesium: 2 mg/dL (ref 1.5–2.5)

## 2024-02-24 NOTE — Progress Notes (Signed)
 [facility] Progress Note  Chief Complaint: GERD  Subjective  Prior history  January 2025 office visit with a constellation of symptoms including reflux and heartburn, fatigue, flatulence and mucus stools, concerns about his brother's history of esophageal cancer.  Previous intermittent symptoms but got worse after an acute probable infectious illness May 2024, and with the systemic symptoms patient was questioning long COVID. EGD February 2025 with grade 2 reflux esophagitis, Hill grade 3 EG junction.  Omeprazole  20 mg with evening meal increased to 40 mg, GERD related diet lifestyle measures reinforced  Doing well at office visit 08/04/2023, advised to try cutting back PPI dose and frequency.  Over neck several months, he got down to omeprazole  20 mg once every other day   Discussed the use of AI scribe software for clinical note transcription with the patient, who gave verbal consent to proceed.  History of Present Illness OTHMAN Gray is a 73 year old male with gastroesophageal reflux disease who presents for follow-up of his reflux symptoms.  Gastroesophageal reflux symptoms - Occasional heartburn since starting omeprazole  20 mg every other day - Only several episodes of regurgitation since starting current omeprazole  regimen - No dysphagia - Used liquid antacid approximately four times since June for breakthrough symptoms - No significant changes in appetite  Dietary and lifestyle modifications - Avoids spicy foods and pepperoni, identified as reflux triggers - Eats dinner at 5 PM and avoids eating close to bedtime - Sleeps with bed elevated to manage reflux symptoms - Primarily sleeps on right side  Still on prednisone  6 mg daily for PMR, and dose that he has been on for at least a year.  Cardiac device - Pacemaker placed after an episode of syncope in June    ROS: Cardiovascular:  no chest pain Respiratory: no dyspnea  The patient's Past Medical, Family  and Social History were reviewed and are on file in the EMR. Past Medical History:  Diagnosis Date   Allergic rhinitis due to pollen    Atrial fibrillation (HCC)    A-Fib, 1987, 2003, 2005, EPS RX 2005 (A-Fib reocurred)   Atrial fibrillation (HCC)    Basal cell carcinoma    skin CA- squamous and Basal cell   BPH (benign prostatic hypertrophy)    Cataract    Fibromyalgia    History of kidney stones    kidney stones 2 years ago   Hyperlipemia    no per pt   Hypertension    no per pt   Kidney stone    Osteoarthritis    Polymyalgia rheumatica    Squamous cell carcinoma in situ     Past Surgical History:  Procedure Laterality Date   ANTERIOR CERVICAL DECOMP/DISCECTOMY FUSION N/A 12/13/2022   Procedure: Anterior Cervical Decompression Fusion  Cervical five-Cervical six - Cervical six-Cervical seven;  Surgeon: Onetha Kuba, MD;  Location: [facility] OR;  Service: Neurosurgery;  Laterality: N/A;   CAPSULOTOMY Bilateral 08/2007   CATARACT EXTRACTION Bilateral    both eyes   COLONOSCOPY     LEAD REVISION/REPAIR N/A 12/19/2023   Procedure: LEAD REVISION/REPAIR;  Surgeon: Inocencio Soyla Lunger, MD;  Location: [facility] INVASIVE CV LAB;  Service: Cardiovascular;  Laterality: N/A;   MENISCUS REPAIR Left 09/2009   left knee---[facility]   NASAL SINUS SURGERY  May 2014   PACEMAKER IMPLANT N/A 12/09/2023   Procedure: PACEMAKER IMPLANT;  Surgeon: Waddell Danelle ORN, MD;  Location: [facility] INVASIVE CV LAB;  Service: Cardiovascular;  Laterality: N/A;   RETINAL  TEAR REPAIR CRYOTHERAPY Right    right 12/06   RETINAL TEAR REPAIR CRYOTHERAPY Right    Retinal tear/bleed- Right 12/06   SQUAMOUS CELL CARCINOMA EXCISION Left 07/2008   left forearm   THUMB ARTHROSCOPY Left 1999     Objective:  Med list reviewed  Current Outpatient Medications:    acetaminophen  (TYLENOL ) 650 MG CR tablet, Take 650 mg by mouth at bedtime as needed for pain., Disp: , Rfl:    Apoaequorin (PREVAGEN EXTRA STRENGTH) 20 MG CAPS, Take 1 capsule  by mouth daily., Disp: , Rfl:    ARTIFICIAL TEAR SOLUTION OP, Place 1 drop into both eyes as needed (dry eyes)., Disp: , Rfl:    clindamycin  (CLEOCIN ) 150 MG capsule, Take 600 mg by mouth See admin instructions. Prior to dental appointments, Disp: , Rfl:    diltiazem  (CARDIZEM  CD) 180 MG 24 hr capsule, TAKE 1 CAPSULE BY MOUTH EVERY DAY, Disp: 90 capsule, Rfl: 2   ELIQUIS  5 MG TABS tablet, TAKE 1 TABLET BY MOUTH TWICE  DAILY, Disp: 180 tablet, Rfl: 3   montelukast  (SINGULAIR ) 10 MG tablet, Take 1 tablet (10 mg total) by mouth as needed. Allergies, Disp: 90 tablet, Rfl: 1   Multiple Vitamins-Minerals (MENS MULTIVITAMIN PLUS PO), Take 1 capsule by mouth daily., Disp: , Rfl:    Omega-3 Fatty Acids (FISH OIL) 1200 MG CAPS, Take 1 capsule by mouth daily., Disp: , Rfl:    omeprazole  (PRILOSEC) 20 MG capsule, Take 1 capsule (20 mg total) by mouth every other day., Disp: 90 capsule, Rfl: 3   predniSONE  (DELTASONE ) 1 MG tablet, TAKE 1 TABLET (1 MG TOTAL) BY MOUTH EVERY OTHER DAY. ALONG WITH A 5 MG TAB, Disp: 45 tablet, Rfl: 3   predniSONE  (DELTASONE ) 5 MG tablet, Take 1 tablet (5 mg total) by mouth every other day. Wean as directed, Disp: 45 tablet, Rfl: 3   tadalafil  (CIALIS ) 10 MG tablet, Take 1 tablet (10 mg total) by mouth daily. OK to take additional 10mg  boost dose as needed 45 minutes prior to sexual activity, Disp: 90 tablet, Rfl: 4   Vital signs in last 24 hrs: Vitals:   02/24/24 0911  BP: (!) 140/80  Pulse: 66   Wt Readings from Last 3 Encounters:  02/24/24 188 lb 8 oz (85.5 kg)  12/19/23 184 lb (83.5 kg)  12/14/23 182 lb (82.6 kg)    Physical Exam  Wife [place]  present for entire visit  No additional exam-entire visit spent in review of results, symptoms and plan   Labs:   ___________________________________________ Radiologic studies:   ____________________________________________ Other:   _____________________________________________   Encounter Diagnosis  Name  Primary?   Gastroesophageal reflux disease with esophagitis without hemorrhage Yes    Assessment & Plan Gastroesophageal reflux disease (GERD) GERD well-controlled with omeprazole  20 mg every other day. Symptoms improved, minimal heartburn, no regurgitation. Discussed potential for increased symptoms and nutrient absorption issues with proton pump inhibitors. - Continue omeprazole  20 mg every other day. - Magnesium level checked earlier this year; iron and B12 levels not recalled as checked.  (No results for those found in this EHR) - Advised to maintain dietary modifications and elevated sleeping position. - Educated on potential need for supplements if nutrient levels are low.    He has been doing well on low-dose medication, and I think that is the best plan for him at this juncture.  Would not recommend consideration of surgical or endoscopic antireflux procedure.  I have reviewed the indications, risks, and benefits  of PPI therapy with the patient today. I have discussed studies that raise ? of increased osteoporosis, and kidney failure and explained that these studies show very weak associations of unclear significance and not clear cause and effect. We did discuss the potential for vitamin malabsorption, to include magnesium (very rare), calcium (easily modifiable with Calcium Citrate supplement), vitamin B12 (again, correctable with oral B12 supplement), and iron (although rarely clinically significant outside patients who require iron supplementation previously), and can monitor each of these periodically with routine labs. We have agreed to continue PPI treatment in this case.   Plan: In addition to continuing omeprazole  current dosing schedule we will check a magnesium, B12 and iron levels today  See me in a year or sooner if needed  20 minutes were spent on this encounter (including chart review, history/exam, counseling/coordination of care, and documentation) > 50% of that time  was spent on counseling and coordination of care.   Victory LITTIE Brand III

## 2024-02-24 NOTE — Patient Instructions (Signed)
 Your provider has requested that you go to the basement level for lab work before leaving today. Press B on the elevator. The lab is located at the first door on the left as you exit the elevator.  Due to recent changes in healthcare laws, you may see the results of your imaging and laboratory studies on MyChart before your provider has had a chance to review them.  We understand that in some cases there may be results that are confusing or concerning to you. Not all laboratory results come back in the same time frame and the provider may be waiting for multiple results in order to interpret others.  Please give us  48 hours in order for your provider to thoroughly review all the results before contacting the office for clarification of your results.   _______________________________________________________  If your blood pressure at your visit was 140/90 or greater, please contact your primary care physician to follow up on this.  _______________________________________________________  If you are age 73 or older, your body mass index should be between 23-30. Your Body mass index is 29.09 kg/m. If this is out of the aforementioned range listed, please consider follow up with your Primary Care Provider.  If you are age 29 or younger, your body mass index should be between 19-25. Your Body mass index is 29.09 kg/m. If this is out of the aformentioned range listed, please consider follow up with your Primary Care Provider.   ________________________________________________________  The [facility] providers would like to encourage you to use MYCHART to communicate with providers for non-urgent requests or questions.  Due to long hold times on the telephone, sending your provider a message by [facility] may be a faster and more efficient way to get a response.  Please allow 48 business hours for a response.  Please remember that this is for non-urgent requests.   _______________________________________________________  [facility] is using a team-based approach to care.  Your team is made up of your doctor and two to three APPS. Our APPS (Nurse Practitioners and Physician Assistants) work with your physician to ensure care continuity for you. They are fully qualified to address your health concerns and develop a treatment plan. They communicate directly with your gastroenterologist to care for you. Seeing the Advanced Practice Practitioners on your physician's team can help you by facilitating care more promptly, often allowing for earlier appointments, access to diagnostic testing, procedures, and other specialty referrals.   Thank you for choosing me and [facility].  Dr.Henry Legrand

## 2024-02-27 DIAGNOSIS — H8111 Benign paroxysmal vertigo, right ear: Secondary | ICD-10-CM | POA: Diagnosis not present

## 2024-02-29 DIAGNOSIS — M5136 Other intervertebral disc degeneration, lumbar region with discogenic back pain only: Secondary | ICD-10-CM | POA: Diagnosis not present

## 2024-02-29 DIAGNOSIS — M62830 Muscle spasm of back: Secondary | ICD-10-CM | POA: Diagnosis not present

## 2024-02-29 DIAGNOSIS — M9903 Segmental and somatic dysfunction of lumbar region: Secondary | ICD-10-CM | POA: Diagnosis not present

## 2024-02-29 DIAGNOSIS — M9902 Segmental and somatic dysfunction of thoracic region: Secondary | ICD-10-CM | POA: Diagnosis not present

## 2024-03-14 ENCOUNTER — Encounter: Admitting: Internal Medicine

## 2024-03-20 ENCOUNTER — Ambulatory Visit: Admitting: Internal Medicine

## 2024-03-21 DIAGNOSIS — M9902 Segmental and somatic dysfunction of thoracic region: Secondary | ICD-10-CM | POA: Diagnosis not present

## 2024-03-21 DIAGNOSIS — M62830 Muscle spasm of back: Secondary | ICD-10-CM | POA: Diagnosis not present

## 2024-03-21 DIAGNOSIS — M5136 Other intervertebral disc degeneration, lumbar region with discogenic back pain only: Secondary | ICD-10-CM | POA: Diagnosis not present

## 2024-03-21 DIAGNOSIS — M9903 Segmental and somatic dysfunction of lumbar region: Secondary | ICD-10-CM | POA: Diagnosis not present

## 2024-04-13 ENCOUNTER — Ambulatory Visit: Admitting: Physician Assistant

## 2024-04-17 NOTE — Progress Notes (Unsigned)
 "     Electrophysiology Clinic Note    Date:  04/18/2024  Patient ID:  Rodney Gray, Rodney Gray 1951-01-03, MRN [id number] PCP:  Avelina Greig BRAVO, MD  Cardiologist:  None  Electrophysiologist:  Donnice DELENA Primus, MD    Discussed the use of AI scribe software for clinical note transcription with the patient, who gave verbal consent to proceed.   Patient Profile    Chief Complaint: routine PPM follow-up  History of Present Illness: Rodney Gray is a 74 y.o. male with PMH notable for perm AFib, tachy-brady s/p PPM, HTN, ; seen today for Donnice DELENA Primus, MD for routine electrophysiology follow-up s/p Pacemaker implant.  He was found to have symptomatic daytime pauses on heart monitor. He is s/p single chamber PPM implant 9/5. Unfortunately, had RV lead dislodgement and s/p lead revision 9/15.   On follow-up today, he is doing very well without any cardiac concerns.  He recently returned to the gym and has been working back up to his previous exercise capacity without any concerning side effects.  He has noticed that his pacemaker appears more prominent particular area.  The pocket is not painful, open, draining, or irritating.  He continues to take Eliquis  twice daily without bleeding concerns. He denies chest pain, chest pressure, palpitations, or increased lower extremity edema.    Arrhythmia/Device History Bos Sci Single chamber PPM, imp 2025; dx ventricular pauses    ROS:  Please see the history of present illness. All other systems are reviewed and otherwise negative.    Physical Exam    VS:  BP 114/68 (BP Location: Left Arm, Patient Position: Sitting, Cuff Size: Normal)   Pulse (!) 59   Ht 5' 8 (1.727 m)   Wt 188 lb (85.3 kg)   SpO2 98%   BMI 28.59 kg/m  BMI: Body mass index is 28.59 kg/m.           Wt Readings from Last 3 Encounters:  04/18/24 188 lb (85.3 kg)  02/24/24 188 lb 8 oz (85.5 kg)  12/19/23 184 lb (83.5 kg)     GEN- The patient is well  appearing, alert and oriented x 3 today.   Lungs- Clear to ausculation bilaterally, normal work of breathing.  Heart- Irregularly irregular rate and rhythm, no murmurs, rubs or gallops Extremities- No peripheral edema, warm, dry Skin-  device pocket well-healed, no tethering    Device interrogation done today and reviewed by myself:  Battery 9.5 years Lead thresholds, impedence, sensing stable Several HVR episodes in 150-160s  Good HR histograms  Turned on Auto capture No other changes made today   Studies Reviewed   Previous EP, cardiology notes.    EKG is ordered. Personal review of EKG from today shows:    EKG Interpretation Date/Time:  Wednesday April 18 2024 11:07:39 EST Ventricular Rate:  59 PR Interval:    QRS Duration:  84 QT Interval:  400 QTC Calculation: 396 R Axis:   -18  Text Interpretation: Atrial fibrillation with slow ventricular response with frequent ventricular-paced complexes Low voltage QRS Confirmed by Javaeh Muscatello [phone]) on 04/18/2024 11:29:58 AM    TTE, 10/05/2023  1. Left ventricular ejection fraction, by estimation, is 60 to 65%. The left ventricle has normal function. The left ventricle has no regional wall motion abnormalities. Left ventricular diastolic parameters are indeterminate.   2. Right ventricular systolic function is normal. The right ventricular size is normal.   3. Left atrial size was severely dilated.   4. Right  atrial size was severely dilated.   5. The mitral valve is normal in structure. Mild mitral valve regurgitation.   6. Tricuspid valve regurgitation is mild to moderate.   7. The aortic valve is tricuspid. Aortic valve regurgitation is not visualized.   8. The inferior vena cava is normal in size with greater than 50% respiratory variability, suggesting right atrial pressure of 3 mmHg.   Cardiac monitor, 09/23/2023 Atrial fib with a slow VR (25/min), RVR (131/min) and controlled VR, ave 65/min.  Rare PVC's Pauses of over  4 seconds are presently both nocturnal as well as daytime.  PPM is recommended.   Assessment and Plan     #) ventricular pauses s/p PPM #) perm afib Device functioning well, see paceart for details Lead measurements stable Good HR histograms Several elevated HR episodes, patient believes it corresponds with sexual activity Continue 180mg  dilt daily  #) Hypercoag d/t afib CHA2DS2-VASc Score = at least 2 [CHF History: 0, HTN History: 1, Diabetes History: 0, Stroke History: 0, Vascular Disease History: 0, Age Score: 1, Gender Score: 0].  Therefore, the patient's annual risk of stroke is 2.2 %.    Stroke ppx - 5mg  eliquis  BID, appropriately dosed No bleeding concerns        Current medicines are reviewed at length with the patient today.   The patient does not have concerns regarding his medicines.  The following changes were made today:  none  Labs/ tests ordered today include:  Orders Placed This Encounter  Procedures   EKG 12-Lead     Disposition: Follow up with Dr. Almetta or EP APP in 6 months   Signed, Lilyahna Sirmon, NP  04/18/2024  12:28 PM  Electrophysiology [facility] "

## 2024-04-18 ENCOUNTER — Encounter: Payer: Self-pay | Admitting: Cardiology

## 2024-04-18 ENCOUNTER — Ambulatory Visit: Attending: Cardiology | Admitting: Cardiology

## 2024-04-18 VITALS — BP 114/68 | HR 59 | Ht 68.0 in | Wt 188.0 lb

## 2024-04-18 DIAGNOSIS — Z950 Presence of cardiac pacemaker: Secondary | ICD-10-CM | POA: Insufficient documentation

## 2024-04-18 DIAGNOSIS — I495 Sick sinus syndrome: Secondary | ICD-10-CM | POA: Diagnosis not present

## 2024-04-18 DIAGNOSIS — I442 Atrioventricular block, complete: Secondary | ICD-10-CM | POA: Diagnosis not present

## 2024-04-18 DIAGNOSIS — I4821 Permanent atrial fibrillation: Secondary | ICD-10-CM | POA: Diagnosis not present

## 2024-04-18 DIAGNOSIS — D6869 Other thrombophilia: Secondary | ICD-10-CM | POA: Diagnosis not present

## 2024-04-18 LAB — CUP PACEART INCLINIC DEVICE CHECK
Date Time Interrogation Session: 20260114123825
Implantable Lead Connection Status: 753985
Implantable Lead Implant Date: 20250915
Implantable Lead Location: 753860
Implantable Lead Model: 7842
Implantable Lead Serial Number: 1509508
Implantable Pulse Generator Implant Date: 20250905
Lead Channel Impedance Value: 905 Ohm
Lead Channel Pacing Threshold Amplitude: 0.6 V
Lead Channel Pacing Threshold Pulse Width: 0.5 ms
Lead Channel Sensing Intrinsic Amplitude: 25 mV
Lead Channel Setting Pacing Amplitude: 5 V
Lead Channel Setting Pacing Pulse Width: 0.4 ms
Lead Channel Setting Sensing Sensitivity: 2.5 mV
Pulse Gen Serial Number: 944053
Zone Setting Status: 755011

## 2024-04-18 NOTE — Patient Instructions (Signed)
 Medication Instructions:  Your physician recommends that you continue on your current medications as directed. Please refer to the Current Medication list given to you today.  *If you need a refill on your cardiac medications before your next appointment, please call your pharmacy*  Lab Work: No labs ordered today    Testing/Procedures: No test ordered today   Follow-Up: At [facility], you and your health needs are our priority.  As part of our continuing mission to provide you with exceptional heart care, our providers are all part of one team.  This team includes your primary Cardiologist (physician) and Advanced Practice Providers or APPs (Physician Assistants and Nurse Practitioners) who all work together to provide you with the care you need, when you need it.  Your next appointment:   6 month(s)  Provider:  Suzann Riddle, NP or Dr. Almetta or the APPS in [place]

## 2024-04-22 ENCOUNTER — Ambulatory Visit: Payer: Self-pay | Admitting: Cardiology

## 2024-04-24 ENCOUNTER — Ambulatory Visit

## 2024-04-24 DIAGNOSIS — I4819 Other persistent atrial fibrillation: Secondary | ICD-10-CM

## 2024-04-25 LAB — CUP PACEART REMOTE DEVICE CHECK
Battery Remaining Longevity: 108 mo
Battery Remaining Percentage: 100 %
Brady Statistic RV Percent Paced: 24 %
Date Time Interrogation Session: 20260120021200
Implantable Lead Connection Status: 753985
Implantable Lead Implant Date: 20250915
Implantable Lead Location: 753860
Implantable Lead Model: 7842
Implantable Lead Serial Number: 1509508
Implantable Pulse Generator Implant Date: 20250905
Lead Channel Impedance Value: 877 Ohm
Lead Channel Pacing Threshold Amplitude: 0.7 V
Lead Channel Pacing Threshold Pulse Width: 0.4 ms
Lead Channel Setting Pacing Amplitude: 1.3 V
Lead Channel Setting Pacing Pulse Width: 0.4 ms
Lead Channel Setting Sensing Sensitivity: 2.5 mV
Pulse Gen Serial Number: 944053
Zone Setting Status: 755011

## 2024-04-27 NOTE — Progress Notes (Signed)
 Remote PPM Transmission

## 2024-04-29 ENCOUNTER — Ambulatory Visit: Payer: Self-pay | Admitting: Cardiology

## 2024-05-08 ENCOUNTER — Encounter: Payer: Self-pay | Admitting: Gastroenterology

## 2024-07-24 ENCOUNTER — Encounter

## 2024-08-10 ENCOUNTER — Ambulatory Visit: Admitting: Neurology

## 2024-10-23 ENCOUNTER — Encounter

## 2024-11-01 ENCOUNTER — Ambulatory Visit

## 2024-11-02 ENCOUNTER — Encounter: Admitting: Family Medicine

## 2024-12-19 ENCOUNTER — Ambulatory Visit: Admitting: Urology

## 2025-01-22 ENCOUNTER — Encounter
